# Patient Record
Sex: Female | Born: 1960 | Race: White | Hispanic: No | Marital: Married | Smoking: Never smoker
Health system: Southern US, Community
[De-identification: ages and names within clinical notes are randomized; demographics above are authoritative.]

## PROBLEM LIST (undated history)

## (undated) ENCOUNTER — Ambulatory Visit: Admission: EM | Payer: BC Managed Care – PPO | Source: Home / Self Care

## (undated) DIAGNOSIS — G4733 Obstructive sleep apnea (adult) (pediatric): Secondary | ICD-10-CM

## (undated) DIAGNOSIS — I10 Essential (primary) hypertension: Secondary | ICD-10-CM

## (undated) DIAGNOSIS — T8453XA Infection and inflammatory reaction due to internal right knee prosthesis, initial encounter: Secondary | ICD-10-CM

## (undated) DIAGNOSIS — M545 Low back pain, unspecified: Secondary | ICD-10-CM

## (undated) DIAGNOSIS — M1731 Unilateral post-traumatic osteoarthritis, right knee: Secondary | ICD-10-CM

## (undated) DIAGNOSIS — K219 Gastro-esophageal reflux disease without esophagitis: Secondary | ICD-10-CM

## (undated) DIAGNOSIS — G8929 Other chronic pain: Secondary | ICD-10-CM

## (undated) DIAGNOSIS — Z9989 Dependence on other enabling machines and devices: Secondary | ICD-10-CM

## (undated) DIAGNOSIS — D649 Anemia, unspecified: Secondary | ICD-10-CM

## (undated) DIAGNOSIS — U071 COVID-19: Secondary | ICD-10-CM

## (undated) DIAGNOSIS — M654 Radial styloid tenosynovitis [de Quervain]: Secondary | ICD-10-CM

## (undated) HISTORY — DX: COVID-19: U07.1

## (undated) HISTORY — PX: BACK SURGERY: SHX140

---

## 1987-04-18 DIAGNOSIS — M1731 Unilateral post-traumatic osteoarthritis, right knee: Secondary | ICD-10-CM

## 1987-04-18 HISTORY — DX: Unilateral post-traumatic osteoarthritis, right knee: M17.31

## 1987-04-18 HISTORY — PX: ANTERIOR CRUCIATE LIGAMENT REPAIR: SHX115

## 2000-09-27 ENCOUNTER — Other Ambulatory Visit: Admission: RE | Admit: 2000-09-27 | Discharge: 2000-09-27 | Payer: Self-pay | Admitting: Neurology

## 2001-01-09 ENCOUNTER — Emergency Department (HOSPITAL_COMMUNITY): Admission: EM | Admit: 2001-01-09 | Discharge: 2001-01-09 | Payer: Self-pay | Admitting: Emergency Medicine

## 2001-01-15 ENCOUNTER — Encounter: Payer: Self-pay | Admitting: Internal Medicine

## 2001-01-15 ENCOUNTER — Ambulatory Visit (HOSPITAL_COMMUNITY): Admission: RE | Admit: 2001-01-15 | Discharge: 2001-01-15 | Payer: Self-pay | Admitting: Internal Medicine

## 2001-01-15 HISTORY — PX: ANTERIOR CERVICAL DECOMP/DISCECTOMY FUSION: SHX1161

## 2001-01-21 ENCOUNTER — Emergency Department (HOSPITAL_COMMUNITY): Admission: EM | Admit: 2001-01-21 | Discharge: 2001-01-21 | Payer: Self-pay

## 2001-02-06 ENCOUNTER — Encounter: Payer: Self-pay | Admitting: Neurosurgery

## 2001-02-06 ENCOUNTER — Ambulatory Visit (HOSPITAL_COMMUNITY): Admission: RE | Admit: 2001-02-06 | Discharge: 2001-02-06 | Payer: Self-pay | Admitting: Neurosurgery

## 2001-04-01 ENCOUNTER — Encounter: Payer: Self-pay | Admitting: Neurosurgery

## 2001-04-01 ENCOUNTER — Ambulatory Visit (HOSPITAL_COMMUNITY): Admission: RE | Admit: 2001-04-01 | Discharge: 2001-04-01 | Payer: Self-pay | Admitting: Neurosurgery

## 2001-05-24 ENCOUNTER — Encounter: Payer: Self-pay | Admitting: Orthopaedic Surgery

## 2001-05-24 ENCOUNTER — Ambulatory Visit (HOSPITAL_COMMUNITY): Admission: RE | Admit: 2001-05-24 | Discharge: 2001-05-24 | Payer: Self-pay | Admitting: Orthopaedic Surgery

## 2001-07-16 HISTORY — PX: KNEE ARTHROSCOPY: SUR90

## 2001-07-31 ENCOUNTER — Ambulatory Visit (HOSPITAL_COMMUNITY): Admission: RE | Admit: 2001-07-31 | Discharge: 2001-07-31 | Payer: Self-pay | Admitting: Orthopaedic Surgery

## 2001-09-26 ENCOUNTER — Ambulatory Visit (HOSPITAL_COMMUNITY): Admission: RE | Admit: 2001-09-26 | Discharge: 2001-09-26 | Payer: Self-pay | Admitting: Orthopaedic Surgery

## 2001-10-22 ENCOUNTER — Encounter: Payer: Self-pay | Admitting: Family Medicine

## 2001-10-22 ENCOUNTER — Ambulatory Visit (HOSPITAL_COMMUNITY): Admission: RE | Admit: 2001-10-22 | Discharge: 2001-10-22 | Payer: Self-pay | Admitting: Family Medicine

## 2002-09-04 ENCOUNTER — Ambulatory Visit (HOSPITAL_COMMUNITY): Admission: RE | Admit: 2002-09-04 | Discharge: 2002-09-04 | Payer: Self-pay | Admitting: Internal Medicine

## 2002-09-04 ENCOUNTER — Encounter: Payer: Self-pay | Admitting: Internal Medicine

## 2003-04-18 DIAGNOSIS — G4733 Obstructive sleep apnea (adult) (pediatric): Secondary | ICD-10-CM

## 2003-04-18 HISTORY — DX: Obstructive sleep apnea (adult) (pediatric): G47.33

## 2003-09-14 ENCOUNTER — Emergency Department (HOSPITAL_COMMUNITY): Admission: EM | Admit: 2003-09-14 | Discharge: 2003-09-14 | Payer: Self-pay | Admitting: Specialist

## 2003-11-26 ENCOUNTER — Ambulatory Visit (HOSPITAL_COMMUNITY): Admission: RE | Admit: 2003-11-26 | Discharge: 2003-11-26 | Payer: Self-pay | Admitting: Family Medicine

## 2003-11-27 ENCOUNTER — Ambulatory Visit (HOSPITAL_COMMUNITY): Admission: RE | Admit: 2003-11-27 | Discharge: 2003-11-27 | Payer: Self-pay | Admitting: Family Medicine

## 2005-01-23 ENCOUNTER — Ambulatory Visit (HOSPITAL_COMMUNITY): Admission: RE | Admit: 2005-01-23 | Discharge: 2005-01-23 | Payer: Self-pay | Admitting: Family Medicine

## 2006-02-08 ENCOUNTER — Ambulatory Visit (HOSPITAL_COMMUNITY): Admission: RE | Admit: 2006-02-08 | Discharge: 2006-02-08 | Payer: Self-pay | Admitting: Family Medicine

## 2007-02-12 ENCOUNTER — Ambulatory Visit (HOSPITAL_COMMUNITY): Admission: RE | Admit: 2007-02-12 | Discharge: 2007-02-12 | Payer: Self-pay | Admitting: Family Medicine

## 2008-02-18 ENCOUNTER — Ambulatory Visit (HOSPITAL_COMMUNITY): Admission: RE | Admit: 2008-02-18 | Discharge: 2008-02-18 | Payer: Self-pay | Admitting: Family Medicine

## 2009-02-19 ENCOUNTER — Ambulatory Visit (HOSPITAL_COMMUNITY): Admission: RE | Admit: 2009-02-19 | Discharge: 2009-02-19 | Payer: Self-pay | Admitting: Family Medicine

## 2010-02-21 ENCOUNTER — Ambulatory Visit (HOSPITAL_COMMUNITY): Admission: RE | Admit: 2010-02-21 | Discharge: 2010-02-21 | Payer: Self-pay | Admitting: Family Medicine

## 2010-06-09 ENCOUNTER — Other Ambulatory Visit (HOSPITAL_COMMUNITY): Payer: Self-pay | Admitting: Family Medicine

## 2010-06-09 ENCOUNTER — Ambulatory Visit (HOSPITAL_COMMUNITY)
Admission: RE | Admit: 2010-06-09 | Discharge: 2010-06-09 | Disposition: A | Discharge status: Home / Self Care | Payer: BC Managed Care – PPO | Source: Ambulatory Visit | Attending: Family Medicine | Admitting: Family Medicine

## 2010-06-09 DIAGNOSIS — R0789 Other chest pain: Secondary | ICD-10-CM | POA: Insufficient documentation

## 2010-09-02 NOTE — Op Note (Signed)
Maria Holt. [facility]  Patient:    Maria Holt Visit Number: [id number] MRN: [id number]          Service Type: DSU Location: 3000 [id number] [id number] Attending Physician:  Donn Pierini Dictated by:   Julio Sicks, M.D. Proc. Date: 02/06/01 Admit Date:  02/06/2001 Discharge Date: 02/06/2001                             Operative Report  PREOPERATIVE DIAGNOSIS:  Left C6-7 herniated nucleus pulposus with radiculopathy.  POSTOPERATIVE DIAGNOSIS:  Left C6-7 herniated nucleus pulposus with radiculopathy.  OPERATION PERFORMED:  C6-7 anterior cervical diskectomy and fusion with allograft and anterior plating.  SURGEON:  Julio Sicks, M.D.  ASSISTANT:  Maria Holt, Maria Holt.  ANESTHESIA:  General endotracheal.  INDICATIONS FOR PROCEDURE:  Maria Holt is a 50 year old female with a history of neck and left upper extremity pain, paresthesias and weakness and a left-sided C7 radiculopathy which has failed conservative management.  MRI scan demonstrates a left-sided C6-7 disk herniation with compression on the left-sided C7 nerve root.  The patient was then counseled as to her options. She has decided to proceed with a C6-7 anterior cervical diskectomy and fusion with allograft anterior plating for hopeful improvement of her symptoms.  DESCRIPTION OF PROCEDURE:  The patient was taken to the operating room and placed on the operating table in supine position.  After an adequate level of anesthesia was achieved, the patient was positioned supine with the neck slightly extended and held in place with halter traction.  The patients anterior cervical region was shaved and prepped sterilely.  A 10 blade was used to make a linear incision overlying the C6-7 interspace.  This was carried down sharply to the platysma.  The platysma was then divided vertically and dissection proceeded along the medial border of the sternocleidomastoid muscle and carotid sheath.  The trachea and esophagus  were mobilized and retracted towards the left.  Prevertebral fascia was stripped off the anterior spinal column.  The longus colli muscles were then elevated bilaterally using electrocautery.  Deep self-retaining retractor was placed. Intraoperative fluoroscopy was used and the level was confirmed.  The disk space was then incised with a 15 blade in a rectangular fashion.  A wide disk space clean-out was then achieved using pituitary rongeurs, forward and backward angled Carlens curets, Kerrison rongeurs and a high speed drill.  All elements of the disk were removed down to the posterior annulus.  Microscope was brought into the field and used throughout the remainder of the diskectomy.  The remaining aspects of annulus and osteophytes were removed using the high speed drill.  The posterior longitudinal ligament was then elevated and resected in piecemeal fashion using Kerrison rongeurs.  The underlying thecal sac was identified.  A wide central decompression was then performed using Kerrison rongeurs.  Decompression was then proceeded out to the left sided C7 foramen.  The C7 nerve root was identified proximally.  A moderate amount of free disk herniation was encountered and removed using micronerve hooks and Kerrison rongeur.  The nerve root was well decompressed throughout its foramen.  There was no evidence of any residual stenosis or compression.  There was no evidence of any residual disk material. Decompression then proceeded out to the entrance of the right sided C7 foramen.  The C7 nerve root was identified proximally and found to be free of any compression.  The wound was then irrigated with antibiotic  solution. Gelfoam was placed topically for hemostasis which was found to be good.  The disk space was then distracted and a 7 mm fibular wedge allograft was impacted into place and recessed approximately 1 mm from the anterior cortical surface. A 23 mm Atlantis anterior cervical  plate was then placed over the C6 and C7 levels.  It was then attached with two 13 mm variable angle screws at C6 and C7.  All this was done under fluoroscopic guidance.  All four screws were given a final tightening.  Locking screws were engaged.  Final images revealed good position of the bone grafts and hardware at the proper operative level with normal alignment of the spine.  Retractor system was removed.  Hemostasis was achieved with bipolar electrocautery.  The wound was then closed in a typical fashion.  Steri-Strips and sterile dressings were applied.   The patient tolerated the procedure well and he returned to the recovery room postoperatively. Dictated by:   Julio Sicks, M.D. Attending Physician:  Donn Pierini DD:  02/06/01 TD:  02/06/01 Job: [id number]

## 2010-09-02 NOTE — Op Note (Signed)
[facility]  Patient:    Maria Holt, Maria Holt Visit Number: [id number] MRN: [id number]          Service Type: DSU Location: DAY Attending Physician:  Windle Guard Dictated by:   Darreld Mclean, M.D. Proc. Date: 09/26/01 Admit Date:  09/26/2001 Discharge Date: 09/26/2001                             Operative Report  PREOPERATIVE DIAGNOSIS:  Tear of the medial meniscus of the right knee.  POSTOPERATIVE DIAGNOSES:  Tear of the medial meniscus of the right knee, small tear posterior horn of the lateral meniscus.  The patient status post ACL repair years ago.  PROCEDURE:  Operative arthroscopy, partial medial meniscectomy, partial lateral meniscectomy.  ANESTHESIA:  General.  TOURNIQUET TIME:  20 minutes.  DRAINS:  None.  SURGEON:  Darreld Mclean, M.D.  ASSISTANT:  Candace Cruise, P.A.-C.  INDICATIONS FOR PROCEDURE:  The patient is a 50 year old female who injured her knee back in [place] in 1989 or there about. She had a nonarthroscopic ACL repair done at that time. The patients knee has done well until this past year when she started having significant pain and tenderness and giving way. MRI shows that the ACL repair is still intact but she has got a tear in the medial meniscus. The patient did not improve with conservative therapy and surgery is recommended.  FINDINGS:  The suprapatellar pouch had some mild synovitis, grade 2 changes of the patellofemoral joint. Medially there is grade 3 changes on the tibia and femur medially with a degenerative type tear of the medial meniscus. Small little particles of meniscus were floating within the joint. The anterior cruciate first looked grossly abnormal but apparently there has been some type of repair. There is a stub left from the original ACL. Laterally there was a tear in the posterior horn of the meniscus and some fraying on the most lateral edge. Grade 2 changes laterally much more significant  arthritis medially.  It should be noted the risks and imponderables of the procedure have been discussed with the patient preoperatively and she appeared to understand the procedure as outlined.  DESCRIPTION OF PROCEDURE:  The patient under general anesthesia, supine on the operating table, tourniquet and leg holder placed and deflated right upper thigh. The patient was prepped and draped in the usual manner. Leg elevated and wrapped circumferentially with an Esmarch bandage. The tourniquet inflated to 300 mmHg, Esmarch bandage removed. The inflow cannula was inserted medially. Arthroscope inserted laterally into the knee and knee was systemically examined. Please see findings above. Attention directed to the medial side and using a meniscal shaver and a punch, good smooth contour was obtained. Attention directed to the lateral side with a small tear and this was removed with a meniscal shaver. Some fraying she had most laterally was also removed with the shaver giving smooth contour. Permanent pictures were taken throughout the case. The knee was systemically examined and no pathology found. The wound was reapproximated with 3-0 nylon in interrupted vertical mattress manner. Marcaine 0.25% was instilled in each portal. The tourniquet was deflated for 20 minutes. A sterile dressing and bulky dressing applied. The patient is allergic to multiple medications and I will give her some Ultram for pain. Any difficulty she is to contact me through the office or hospital beeper system. I will be seeing her in approximately 10 days to 2 weeks. Physical therapy has been  arranged. Dictated by:   Darreld Mclean, M.D. Attending Physician:  Windle Guard DD:  09/26/01 TD:  09/28/01 Job: [id number]

## 2010-09-02 NOTE — H&P (Signed)
[facility]  Patient:    Maria Holt, Maria Holt Visit Number: [id number] MRN: [id number]          Service Type: Attending:  Darreld Mclean, M.D. Dictated by:   Darreld Mclean, M.D. Adm. Date:  07/29/01                           History and Physical  CHIEF COMPLAINT:  Right knee pain.  HISTORY OF PRESENT ILLNESS:  The patient is a 50 year old female, with pain and tenderness of her right knee.  She has had significant problems with her right knee, having had an ACL repair on the right in 1989 in [place].  She had a ligament reconstruction nonarthroscopic that was done.  She says she had a very long recovery.  Since then she has had increased pain and tenderness to her knee just over the last six months.  I first saw her May 17, 2001 with complaints of pain and tenderness of the right knee.  I was concerned about a meniscal tear and also about a possibility of a new ACL tear.  She had an MRI done of the knee at the hospital on May 25, 2001.  I saw her on May 31, 2001 and explained to her the MRI showed that the ACL ligament was maintained with no obvious tear; however, she had a tear of the medial meniscus.  She had post surgical changes in the knee from [facility] repair done in 1989.  The knee gives way several times.  She has pain, tenderness, and problems.  The patient wanted to consider surgery but wanted to delay this for a while until she had an okay from her medical doctor because she has had problems with her thyroid after having thyroid surgery.  The patient informs me today, July 29, 2001, that she is just recently discovered to have reflux esophagitis really bad and she is supposed to be seen by the cardiologist to make sure that none of the chest pain she is having can be related to her heart.  I think it is mainly related to the reflux.  PAST SURGICAL HISTORY:  1. ACL repair in 1989 in [place].  2. Anterior diskectomy with fusion in October 2002  [place]  3. Thyroid problems as well.  PAST MEDICAL HISTORY:  History of ulcer disease and reflux as stated. Otherwise, denies heart disease, lung disease, kidney disease, stroke, paralysis, weakness, hypertension, diabetes, TB, rheumatic fever, cancer, polio, or circulatory problems.  ALLERGIES:  1. MORPHINE.  2. DEMEROL.  3. DILAUDID.  CURRENT MEDICATIONS:  1. Tums.  2. Advil.  3. Recently began on Prevacid.  SOCIAL HISTORY:  The patient does not smoke.  She does not drink.  She has finished high school plus two years of college.  Dr. Regino Schultze and Dr. Phillips Odor are her family doctors.  The patient is married and works for the [employer] of [address], lives here in [place], [place].  FAMILY HISTORY:  Hypertension in her father, and he has had open heart surgery.  Paternal grandmother and maternal grandmother have had strokes.  PHYSICAL EXAMINATION:  VITAL SIGNS:  Within normal limits.  HEENT:  Negative.  NECK:  Supple.  LUNGS:  Clear to P&A.  HEART:  Regular without murmur heard.  ABDOMEN:  Soft, nontender, without masses.  EXTREMITIES:  Right knee with some mild effusion.  Well-healed scars from previous surgery.  Slight  crepitus present.  Knee stable.  Other extremities within normal limits.  CNS:  Intact.  SKIN:  Intact.  IMPRESSION:  1. Right knee medial meniscal injury.  2. Status post acromioclavicular repair by nonarthroscopic means in 1989.  3. Recent history of gastric reflux disease, gastroesophageal reflux disease.  4. History of laminectomy, anterior neck with fusion.  5. Allergies to MULTIPLE PAIN MEDICATIONS.  PLAN:  Operative arthroscopy of the right knee.  Discussed with the patient planned procedure, risks, and problems, and appears to understand.  We will await the opinion of the cardiologist and her family physician before final definitive surgical plans. Dictated by:   Darreld Mclean,  M.D. Attending:  Darreld Mclean, M.D. DD:  07/29/01 TD:  07/29/01 Job: [id number]

## 2011-01-18 ENCOUNTER — Other Ambulatory Visit (HOSPITAL_COMMUNITY): Payer: Self-pay | Admitting: Family Medicine

## 2011-01-18 DIAGNOSIS — Z139 Encounter for screening, unspecified: Secondary | ICD-10-CM

## 2011-02-24 ENCOUNTER — Ambulatory Visit (HOSPITAL_COMMUNITY)
Admission: RE | Admit: 2011-02-24 | Discharge: 2011-02-24 | Disposition: A | Discharge status: Home / Self Care | Payer: BC Managed Care – PPO | Source: Ambulatory Visit | Attending: Family Medicine | Admitting: Family Medicine

## 2011-02-24 DIAGNOSIS — Z139 Encounter for screening, unspecified: Secondary | ICD-10-CM

## 2011-02-24 DIAGNOSIS — Z1231 Encounter for screening mammogram for malignant neoplasm of breast: Secondary | ICD-10-CM | POA: Insufficient documentation

## 2012-01-31 ENCOUNTER — Other Ambulatory Visit (HOSPITAL_COMMUNITY): Payer: Self-pay | Admitting: Family Medicine

## 2012-01-31 DIAGNOSIS — Z139 Encounter for screening, unspecified: Secondary | ICD-10-CM

## 2012-02-27 ENCOUNTER — Ambulatory Visit (HOSPITAL_COMMUNITY)
Admission: RE | Admit: 2012-02-27 | Discharge: 2012-02-27 | Disposition: A | Discharge status: Home / Self Care | Payer: BC Managed Care – PPO | Source: Ambulatory Visit | Attending: Family Medicine | Admitting: Family Medicine

## 2012-02-27 DIAGNOSIS — Z139 Encounter for screening, unspecified: Secondary | ICD-10-CM

## 2012-02-27 DIAGNOSIS — Z1231 Encounter for screening mammogram for malignant neoplasm of breast: Secondary | ICD-10-CM | POA: Insufficient documentation

## 2012-02-28 ENCOUNTER — Other Ambulatory Visit: Payer: Self-pay | Admitting: Orthopedic Surgery

## 2012-03-08 ENCOUNTER — Ambulatory Visit (HOSPITAL_COMMUNITY)
Admission: RE | Admit: 2012-03-08 | Discharge: 2012-03-08 | Disposition: A | Discharge status: Home / Self Care | Payer: BC Managed Care – PPO | Source: Ambulatory Visit | Attending: Orthopedic Surgery | Admitting: Orthopedic Surgery

## 2012-03-08 ENCOUNTER — Encounter (HOSPITAL_COMMUNITY): Payer: Self-pay

## 2012-03-08 ENCOUNTER — Encounter (HOSPITAL_COMMUNITY)
Admission: RE | Admit: 2012-03-08 | Discharge: 2012-03-08 | Disposition: A | Discharge status: Home / Self Care | Payer: BC Managed Care – PPO | Source: Ambulatory Visit | Attending: Orthopedic Surgery | Admitting: Orthopedic Surgery

## 2012-03-08 DIAGNOSIS — Z01818 Encounter for other preprocedural examination: Secondary | ICD-10-CM | POA: Insufficient documentation

## 2012-03-08 HISTORY — DX: Essential (primary) hypertension: I10

## 2012-03-08 HISTORY — DX: Gastro-esophageal reflux disease without esophagitis: K21.9

## 2012-03-08 LAB — SURGICAL PCR SCREEN: Staphylococcus aureus: NEGATIVE

## 2012-03-08 LAB — CBC
HCT: 35.7 % — ABNORMAL LOW (ref 36.0–46.0)
MCH: 26.8 pg (ref 26.0–34.0)
MCHC: 33.1 g/dL (ref 30.0–36.0)
MCV: 81 fL (ref 78.0–100.0)
Platelets: 236 10*3/uL (ref 150–400)
RDW: 15.6 % — ABNORMAL HIGH (ref 11.5–15.5)
WBC: 8.1 10*3/uL (ref 4.0–10.5)

## 2012-03-08 LAB — BASIC METABOLIC PANEL
BUN: 9 mg/dL (ref 6–23)
CO2: 26 mEq/L (ref 19–32)
Calcium: 9.5 mg/dL (ref 8.4–10.5)
Chloride: 101 mEq/L (ref 96–112)
Creatinine, Ser: 0.67 mg/dL (ref 0.50–1.10)
Glucose, Bld: 97 mg/dL (ref 70–99)

## 2012-03-08 LAB — URINALYSIS, ROUTINE W REFLEX MICROSCOPIC
Bilirubin Urine: NEGATIVE
Glucose, UA: NEGATIVE mg/dL
Hgb urine dipstick: NEGATIVE
Ketones, ur: NEGATIVE mg/dL
Protein, ur: NEGATIVE mg/dL
pH: 8 (ref 5.0–8.0)

## 2012-03-08 LAB — URINE MICROSCOPIC-ADD ON

## 2012-03-08 NOTE — Pre-Procedure Instructions (Signed)
20 BUNA CUPPETT  03/08/2012   Your procedure is scheduled on: Tuesday, December 3RD   Report to [facility] at  5:30 AM.  Call this number if you have problems the morning of surgery: [phone]   Remember:   Do not eat food OR drink any liquids:After Midnight Monday.    Take these medicines the morning of surgery with A SIP OF WATER:  Prevacid   Do not wear jewelry, make-up or nail polish.  Do not wear lotions, powders, or perfumes. You may NOT wear deodorant.   Do not shave 48 hours prior to surgery.   Do not bring valuables to the hospital.   Contacts, dentures or bridgework may not be worn into surgery.  Leave suitcase in the car. After surgery it may be brought to your room.  For patients admitted to the hospital, checkout time is 11:00 AM the day of discharge.   Patients discharged the day of surgery will not be allowed to drive home.   Name and phone number of your driver: PATRICK --  SPOUSE    Special Instructions: Shower using CHG 2 nights before surgery and the night before surgery.  If you shower the day of surgery use CHG.  Use special wash - you have one bottle of CHG for all showers.  You should use approximately 1/3 of the bottle for each shower.   Please read over the following fact sheets that you were given: Pain Booklet, Coughing and Deep Breathing, Blood Transfusion Information, MRSA Information and Surgical Site Infection Prevention

## 2012-03-08 NOTE — Progress Notes (Signed)
Friday---PATIENT USES CPAP.Marland KitchenMarland KitchenWILL BRING MASK & SETTINGS DOS... SHE ALSO SAID THAT SHE SAW A CARDIO ([facility] ?? ) IN [place] 5-6 YRS AGO FOR SOME CHEST DISCOMFORT...HAD CATH..."ARTERIES WERE LIKE A 51 YR OLD"..NO PROBLEMS SINCE AND HAS NOT BEEN BACK TO SEE ANY CARDIOLOGIST...   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  DA

## 2012-03-09 LAB — URINE CULTURE: Colony Count: NO GROWTH

## 2012-03-18 MED ORDER — CEFAZOLIN SODIUM-DEXTROSE 2-3 GM-% IV SOLR
2.0000 g | INTRAVENOUS | Status: AC
  Administered 2012-03-19: 2 g via INTRAVENOUS
  Filled 2012-03-18: qty 50

## 2012-03-19 ENCOUNTER — Inpatient Hospital Stay (HOSPITAL_COMMUNITY): Payer: BC Managed Care – PPO

## 2012-03-19 ENCOUNTER — Encounter (HOSPITAL_COMMUNITY): Payer: Self-pay | Admitting: Anesthesiology

## 2012-03-19 ENCOUNTER — Encounter (HOSPITAL_COMMUNITY)
Admission: RE | Disposition: A | Discharge status: Home Health | Payer: Self-pay | Source: Ambulatory Visit | Attending: Orthopedic Surgery

## 2012-03-19 ENCOUNTER — Inpatient Hospital Stay (HOSPITAL_COMMUNITY)
Admission: RE | Admit: 2012-03-19 | Discharge: 2012-03-20 | DRG: 209 | Disposition: A | Discharge status: Home Health | Payer: BC Managed Care – PPO | Source: Ambulatory Visit | Attending: Orthopedic Surgery | Admitting: Orthopedic Surgery

## 2012-03-19 ENCOUNTER — Encounter (HOSPITAL_COMMUNITY): Payer: Self-pay | Admitting: *Deleted

## 2012-03-19 ENCOUNTER — Inpatient Hospital Stay (HOSPITAL_COMMUNITY): Payer: BC Managed Care – PPO | Admitting: Anesthesiology

## 2012-03-19 DIAGNOSIS — K219 Gastro-esophageal reflux disease without esophagitis: Secondary | ICD-10-CM | POA: Diagnosis present

## 2012-03-19 DIAGNOSIS — M1710 Unilateral primary osteoarthritis, unspecified knee: Principal | ICD-10-CM | POA: Diagnosis present

## 2012-03-19 DIAGNOSIS — Z882 Allergy status to sulfonamides status: Secondary | ICD-10-CM

## 2012-03-19 DIAGNOSIS — Z6841 Body Mass Index (BMI) 40.0 and over, adult: Secondary | ICD-10-CM

## 2012-03-19 DIAGNOSIS — Z79899 Other long term (current) drug therapy: Secondary | ICD-10-CM

## 2012-03-19 DIAGNOSIS — G4733 Obstructive sleep apnea (adult) (pediatric): Secondary | ICD-10-CM | POA: Diagnosis present

## 2012-03-19 DIAGNOSIS — M654 Radial styloid tenosynovitis [de Quervain]: Secondary | ICD-10-CM | POA: Diagnosis present

## 2012-03-19 DIAGNOSIS — G8929 Other chronic pain: Secondary | ICD-10-CM | POA: Diagnosis present

## 2012-03-19 DIAGNOSIS — Z885 Allergy status to narcotic agent status: Secondary | ICD-10-CM

## 2012-03-19 DIAGNOSIS — I10 Essential (primary) hypertension: Secondary | ICD-10-CM | POA: Diagnosis present

## 2012-03-19 DIAGNOSIS — M1732 Unilateral post-traumatic osteoarthritis, left knee: Secondary | ICD-10-CM | POA: Diagnosis present

## 2012-03-19 DIAGNOSIS — M549 Dorsalgia, unspecified: Secondary | ICD-10-CM | POA: Diagnosis present

## 2012-03-19 HISTORY — DX: Low back pain: M54.5

## 2012-03-19 HISTORY — DX: Low back pain, unspecified: M54.50

## 2012-03-19 HISTORY — DX: Radial styloid tenosynovitis (de quervain): M65.4

## 2012-03-19 HISTORY — DX: Unilateral post-traumatic osteoarthritis, right knee: M17.31

## 2012-03-19 HISTORY — DX: Obstructive sleep apnea (adult) (pediatric): G47.33

## 2012-03-19 HISTORY — PX: TOTAL KNEE ARTHROPLASTY: SHX125

## 2012-03-19 HISTORY — DX: Other chronic pain: G89.29

## 2012-03-19 HISTORY — DX: Dependence on other enabling machines and devices: Z99.89

## 2012-03-19 LAB — ABO/RH: ABO/RH(D): O POS

## 2012-03-19 LAB — TYPE AND SCREEN: ABO/RH(D): O POS

## 2012-03-19 LAB — HCG, SERUM, QUALITATIVE: Preg, Serum: NEGATIVE

## 2012-03-19 SURGERY — ARTHROPLASTY, KNEE, TOTAL
Anesthesia: Regional | Site: Knee | Laterality: Right | Wound class: Clean

## 2012-03-19 MED ORDER — PANTOPRAZOLE SODIUM 20 MG PO TBEC
20.0000 mg | DELAYED_RELEASE_TABLET | Freq: Every day | ORAL | Status: DC
  Administered 2012-03-20: 20 mg via ORAL
  Filled 2012-03-19: qty 1

## 2012-03-19 MED ORDER — WARFARIN SODIUM 10 MG PO TABS
10.0000 mg | ORAL_TABLET | Freq: Once | ORAL | Status: AC
  Administered 2012-03-19: 10 mg via ORAL
  Filled 2012-03-19: qty 1

## 2012-03-19 MED ORDER — PROPOFOL 10 MG/ML IV BOLUS
INTRAVENOUS | Status: DC | PRN
  Administered 2012-03-19: 200 mg via INTRAVENOUS
  Administered 2012-03-19: 50 mg via INTRAVENOUS

## 2012-03-19 MED ORDER — ALUM & MAG HYDROXIDE-SIMETH 200-200-20 MG/5ML PO SUSP: 30.0000 mL | ORAL | Status: DC | PRN

## 2012-03-19 MED ORDER — ACETAMINOPHEN 325 MG PO TABS: 650.0000 mg | ORAL_TABLET | Freq: Four times a day (QID) | ORAL | Status: DC | PRN

## 2012-03-19 MED ORDER — FENTANYL CITRATE 0.05 MG/ML IJ SOLN
INTRAMUSCULAR | Status: DC | PRN
  Administered 2012-03-19 (×2): 100 ug via INTRAVENOUS
  Administered 2012-03-19 (×2): 25 ug via INTRAVENOUS
  Administered 2012-03-19 (×2): 50 ug via INTRAVENOUS

## 2012-03-19 MED ORDER — WARFARIN SODIUM 5 MG PO TABS: 5.0000 mg | ORAL_TABLET | Freq: Every day | ORAL | Status: DC

## 2012-03-19 MED ORDER — LISINOPRIL-HYDROCHLOROTHIAZIDE 10-12.5 MG PO TABS: 1.0000 | ORAL_TABLET | Freq: Every day | ORAL | Status: DC

## 2012-03-19 MED ORDER — KETOROLAC TROMETHAMINE 30 MG/ML IJ SOLN
15.0000 mg | Freq: Once | INTRAMUSCULAR | Status: AC | PRN
  Administered 2012-03-19: 30 mg via INTRAVENOUS

## 2012-03-19 MED ORDER — POTASSIUM CHLORIDE IN NACL 20-0.45 MEQ/L-% IV SOLN
INTRAVENOUS | Status: DC
  Administered 2012-03-19 – 2012-03-20 (×2): via INTRAVENOUS
  Filled 2012-03-19 (×4): qty 1000

## 2012-03-19 MED ORDER — ADULT MULTIVITAMIN W/MINERALS CH
1.0000 | ORAL_TABLET | Freq: Every day | ORAL | Status: DC
  Administered 2012-03-19 – 2012-03-20 (×2): 1 via ORAL
  Filled 2012-03-19 (×2): qty 1

## 2012-03-19 MED ORDER — PROMETHAZINE HCL 25 MG PO TABS: 25.0000 mg | ORAL_TABLET | Freq: Four times a day (QID) | ORAL | Status: DC | PRN

## 2012-03-19 MED ORDER — DEXAMETHASONE SODIUM PHOSPHATE 10 MG/ML IJ SOLN
10.0000 mg | Freq: Three times a day (TID) | INTRAMUSCULAR | Status: AC
  Filled 2012-03-19 (×3): qty 1

## 2012-03-19 MED ORDER — MIDAZOLAM HCL 5 MG/5ML IJ SOLN
INTRAMUSCULAR | Status: DC | PRN
  Administered 2012-03-19: 2 mg via INTRAVENOUS

## 2012-03-19 MED ORDER — METHOCARBAMOL 100 MG/ML IJ SOLN
500.0000 mg | Freq: Four times a day (QID) | INTRAVENOUS | Status: DC | PRN
  Filled 2012-03-19: qty 5

## 2012-03-19 MED ORDER — BUPIVACAINE HCL (PF) 0.25 % IJ SOLN
INTRAMUSCULAR | Status: AC
  Filled 2012-03-19: qty 30

## 2012-03-19 MED ORDER — ZOLPIDEM TARTRATE 5 MG PO TABS: 5.0000 mg | ORAL_TABLET | Freq: Every evening | ORAL | Status: DC | PRN

## 2012-03-19 MED ORDER — FENTANYL CITRATE 0.05 MG/ML IJ SOLN
25.0000 ug | INTRAMUSCULAR | Status: DC | PRN
  Administered 2012-03-19 (×2): 50 ug via INTRAVENOUS

## 2012-03-19 MED ORDER — KETOROLAC TROMETHAMINE 30 MG/ML IJ SOLN
INTRAMUSCULAR | Status: AC
  Filled 2012-03-19: qty 1

## 2012-03-19 MED ORDER — OXYCODONE HCL 5 MG PO TABS
5.0000 mg | ORAL_TABLET | ORAL | Status: DC | PRN
  Administered 2012-03-19 – 2012-03-20 (×2): 10 mg via ORAL
  Filled 2012-03-19 (×2): qty 2

## 2012-03-19 MED ORDER — SENNA-DOCUSATE SODIUM 8.6-50 MG PO TABS: 1.0000 | ORAL_TABLET | Freq: Every day | ORAL | Status: DC

## 2012-03-19 MED ORDER — DIPHENHYDRAMINE HCL 12.5 MG/5ML PO ELIX: 12.5000 mg | ORAL_SOLUTION | ORAL | Status: DC | PRN

## 2012-03-19 MED ORDER — ARTIFICIAL TEARS OP OINT
TOPICAL_OINTMENT | OPHTHALMIC | Status: DC | PRN
  Administered 2012-03-19: 1 via OPHTHALMIC

## 2012-03-19 MED ORDER — WARFARIN - PHARMACIST DOSING INPATIENT: Freq: Every day | Status: DC

## 2012-03-19 MED ORDER — METOCLOPRAMIDE HCL 5 MG/ML IJ SOLN: 5.0000 mg | Freq: Three times a day (TID) | INTRAMUSCULAR | Status: DC | PRN

## 2012-03-19 MED ORDER — TRIAMCINOLONE ACETONIDE 0.1 % EX CREA: 1.0000 "application " | TOPICAL_CREAM | Freq: Two times a day (BID) | CUTANEOUS | Status: DC | PRN

## 2012-03-19 MED ORDER — POLYETHYLENE GLYCOL 3350 17 G PO PACK: 17.0000 g | PACK | Freq: Every day | ORAL | Status: DC | PRN

## 2012-03-19 MED ORDER — ACETAMINOPHEN 650 MG RE SUPP: 650.0000 mg | Freq: Four times a day (QID) | RECTAL | Status: DC | PRN

## 2012-03-19 MED ORDER — KETOROLAC TROMETHAMINE 15 MG/ML IJ SOLN
7.5000 mg | Freq: Four times a day (QID) | INTRAMUSCULAR | Status: DC
  Administered 2012-03-19 (×2): 7.5 mg via INTRAVENOUS
  Administered 2012-03-20: 05:00:00 via INTRAVENOUS
  Filled 2012-03-19 (×4): qty 1

## 2012-03-19 MED ORDER — DEXAMETHASONE 6 MG PO TABS
10.0000 mg | ORAL_TABLET | Freq: Three times a day (TID) | ORAL | Status: AC
  Administered 2012-03-19 – 2012-03-20 (×3): 10 mg via ORAL
  Filled 2012-03-19 (×3): qty 1

## 2012-03-19 MED ORDER — FENTANYL CITRATE 0.05 MG/ML IJ SOLN
INTRAMUSCULAR | Status: AC
  Filled 2012-03-19: qty 2

## 2012-03-19 MED ORDER — PROMETHAZINE HCL 25 MG/ML IJ SOLN: 6.2500 mg | INTRAMUSCULAR | Status: DC | PRN

## 2012-03-19 MED ORDER — DOCUSATE SODIUM 100 MG PO CAPS
100.0000 mg | ORAL_CAPSULE | Freq: Two times a day (BID) | ORAL | Status: DC
  Administered 2012-03-19 – 2012-03-20 (×3): 100 mg via ORAL
  Filled 2012-03-19 (×3): qty 1

## 2012-03-19 MED ORDER — LIDOCAINE HCL (CARDIAC) 20 MG/ML IV SOLN
INTRAVENOUS | Status: DC | PRN
  Administered 2012-03-19: 50 mg via INTRAVENOUS

## 2012-03-19 MED ORDER — ACETAMINOPHEN 10 MG/ML IV SOLN
1000.0000 mg | Freq: Four times a day (QID) | INTRAVENOUS | Status: AC
  Administered 2012-03-19 – 2012-03-20 (×4): 1000 mg via INTRAVENOUS
  Filled 2012-03-19 (×4): qty 100

## 2012-03-19 MED ORDER — HYDROCHLOROTHIAZIDE 12.5 MG PO CAPS
12.5000 mg | ORAL_CAPSULE | Freq: Every day | ORAL | Status: DC
  Administered 2012-03-19: 12.5 mg via ORAL
  Filled 2012-03-19 (×2): qty 1

## 2012-03-19 MED ORDER — GLYCOPYRROLATE 0.2 MG/ML IJ SOLN
INTRAMUSCULAR | Status: DC | PRN
  Administered 2012-03-19: 0.6 mg via INTRAVENOUS

## 2012-03-19 MED ORDER — SORBITOL 70 % SOLN: 30.0000 mL | Freq: Every day | Status: DC | PRN

## 2012-03-19 MED ORDER — FENTANYL CITRATE 0.05 MG/ML IJ SOLN
25.0000 ug | INTRAMUSCULAR | Status: DC | PRN
  Administered 2012-03-19: 25 ug via INTRAVENOUS
  Filled 2012-03-19: qty 2

## 2012-03-19 MED ORDER — ONDANSETRON HCL 4 MG PO TABS: 4.0000 mg | ORAL_TABLET | Freq: Four times a day (QID) | ORAL | Status: DC | PRN

## 2012-03-19 MED ORDER — MENTHOL 3 MG MT LOZG
1.0000 | LOZENGE | OROMUCOSAL | Status: DC | PRN
  Filled 2012-03-19: qty 9

## 2012-03-19 MED ORDER — PHENOL 1.4 % MT LIQD: 1.0000 | OROMUCOSAL | Status: DC | PRN

## 2012-03-19 MED ORDER — OXYCODONE-ACETAMINOPHEN 10-325 MG PO TABS: 1.0000 | ORAL_TABLET | Freq: Four times a day (QID) | ORAL | Status: DC | PRN

## 2012-03-19 MED ORDER — MAGNESIUM CITRATE PO SOLN: 1.0000 | Freq: Once | ORAL | Status: AC | PRN

## 2012-03-19 MED ORDER — ROPIVACAINE HCL 5 MG/ML IJ SOLN
INTRAMUSCULAR | Status: DC | PRN
  Administered 2012-03-19: 30 mL via EPIDURAL

## 2012-03-19 MED ORDER — CALCIUM CARBONATE 600 MG PO TABS
1200.0000 mg | ORAL_TABLET | Freq: Every day | ORAL | Status: DC
  Filled 2012-03-19 (×2): qty 2

## 2012-03-19 MED ORDER — NEOSTIGMINE METHYLSULFATE 1 MG/ML IJ SOLN
INTRAMUSCULAR | Status: DC | PRN
  Administered 2012-03-19: 3 mg via INTRAVENOUS

## 2012-03-19 MED ORDER — MIDAZOLAM HCL 2 MG/2ML IJ SOLN: 0.5000 mg | Freq: Once | INTRAMUSCULAR | Status: DC | PRN

## 2012-03-19 MED ORDER — LACTATED RINGERS IV SOLN
INTRAVENOUS | Status: DC | PRN
  Administered 2012-03-19 (×2): via INTRAVENOUS

## 2012-03-19 MED ORDER — ONDANSETRON HCL 4 MG/2ML IJ SOLN
INTRAMUSCULAR | Status: DC | PRN
  Administered 2012-03-19: 4 mg via INTRAVENOUS

## 2012-03-19 MED ORDER — ENOXAPARIN SODIUM 30 MG/0.3ML ~~LOC~~ SOLN
30.0000 mg | Freq: Two times a day (BID) | SUBCUTANEOUS | Status: DC
  Administered 2012-03-20: 30 mg via SUBCUTANEOUS
  Filled 2012-03-19 (×3): qty 0.3

## 2012-03-19 MED ORDER — METOCLOPRAMIDE HCL 10 MG PO TABS: 5.0000 mg | ORAL_TABLET | Freq: Three times a day (TID) | ORAL | Status: DC | PRN

## 2012-03-19 MED ORDER — SENNA 8.6 MG PO TABS
1.0000 | ORAL_TABLET | Freq: Two times a day (BID) | ORAL | Status: DC
  Administered 2012-03-19 – 2012-03-20 (×3): 8.6 mg via ORAL
  Filled 2012-03-19 (×4): qty 1

## 2012-03-19 MED ORDER — OMEGA-3-ACID ETHYL ESTERS 1 G PO CAPS
1.0000 g | ORAL_CAPSULE | Freq: Every day | ORAL | Status: DC
  Administered 2012-03-19 – 2012-03-20 (×2): 1 g via ORAL
  Filled 2012-03-19 (×2): qty 1

## 2012-03-19 MED ORDER — ONDANSETRON HCL 4 MG/2ML IJ SOLN: 4.0000 mg | Freq: Four times a day (QID) | INTRAMUSCULAR | Status: DC | PRN

## 2012-03-19 MED ORDER — ACETAMINOPHEN 10 MG/ML IV SOLN
INTRAVENOUS | Status: AC
  Filled 2012-03-19: qty 100

## 2012-03-19 MED ORDER — BISACODYL 5 MG PO TBEC: 5.0000 mg | DELAYED_RELEASE_TABLET | Freq: Every day | ORAL | Status: DC | PRN

## 2012-03-19 MED ORDER — METHOCARBAMOL 500 MG PO TABS
500.0000 mg | ORAL_TABLET | Freq: Four times a day (QID) | ORAL | Status: DC | PRN
  Administered 2012-03-19: 500 mg via ORAL
  Filled 2012-03-19: qty 1

## 2012-03-19 MED ORDER — METHOCARBAMOL 500 MG PO TABS: 500.0000 mg | ORAL_TABLET | Freq: Four times a day (QID) | ORAL | Status: DC

## 2012-03-19 MED ORDER — VECURONIUM BROMIDE 10 MG IV SOLR
INTRAVENOUS | Status: DC | PRN
  Administered 2012-03-19: 7 mg via INTRAVENOUS

## 2012-03-19 MED ORDER — LISINOPRIL 10 MG PO TABS
10.0000 mg | ORAL_TABLET | Freq: Every day | ORAL | Status: DC
  Administered 2012-03-19: 10 mg via ORAL
  Filled 2012-03-19 (×3): qty 1

## 2012-03-19 MED ORDER — SODIUM CHLORIDE 0.9 % IR SOLN
Status: DC | PRN
  Administered 2012-03-19: 1

## 2012-03-19 MED ORDER — CEFAZOLIN SODIUM-DEXTROSE 2-3 GM-% IV SOLR
2.0000 g | Freq: Four times a day (QID) | INTRAVENOUS | Status: AC
  Administered 2012-03-19 (×2): 2 g via INTRAVENOUS
  Filled 2012-03-19 (×2): qty 50

## 2012-03-19 SURGICAL SUPPLY — 65 items
APL SKNCLS STERI-STRIP NONHPOA (GAUZE/BANDAGES/DRESSINGS)
BANDAGE ELASTIC 6 VELCRO ST LF (GAUZE/BANDAGES/DRESSINGS) ×3 IMPLANT
BANDAGE ESMARK 6X9 LF (GAUZE/BANDAGES/DRESSINGS) ×1 IMPLANT
BENZOIN TINCTURE PRP APPL 2/3 (GAUZE/BANDAGES/DRESSINGS) ×1 IMPLANT
BLADE SAG 18X100X1.27 (BLADE) ×2 IMPLANT
BLADE SAW RECIP 87.9 MT (BLADE) ×2 IMPLANT
BLADE SAW SGTL 13X75X1.27 (BLADE) ×2 IMPLANT
BNDG CMPR 9X6 STRL LF SNTH (GAUZE/BANDAGES/DRESSINGS) ×1
BNDG ESMARK 6X9 LF (GAUZE/BANDAGES/DRESSINGS) ×2
BOOTCOVER CLEANROOM LRG (PROTECTIVE WEAR) ×4 IMPLANT
BOWL SMART MIX CTS (DISPOSABLE) ×2 IMPLANT
CEMENT HV SMART SET (Cement) ×4 IMPLANT
CLOTH BEACON ORANGE TIMEOUT ST (SAFETY) ×2 IMPLANT
COVER SURGICAL LIGHT HANDLE (MISCELLANEOUS) ×2 IMPLANT
CUFF TOURNIQUET SINGLE 44IN (TOURNIQUET CUFF) ×1 IMPLANT
DRAPE EXTREMITY T 121X128X90 (DRAPE) ×2 IMPLANT
DRAPE PROXIMA HALF (DRAPES) ×2 IMPLANT
DRAPE U-SHAPE 47X51 STRL (DRAPES) ×2 IMPLANT
DRSG PAD ABDOMINAL 8X10 ST (GAUZE/BANDAGES/DRESSINGS) ×3 IMPLANT
DURAPREP 26ML APPLICATOR (WOUND CARE) ×2 IMPLANT
ELECT CAUTERY BLADE 6.4 (BLADE) ×2 IMPLANT
ELECT REM PT RETURN 9FT ADLT (ELECTROSURGICAL) ×2
ELECTRODE REM PT RTRN 9FT ADLT (ELECTROSURGICAL) ×1 IMPLANT
FACESHIELD LNG OPTICON STERILE (SAFETY) ×2 IMPLANT
GAUZE XEROFORM 5X9 LF (GAUZE/BANDAGES/DRESSINGS) ×1 IMPLANT
GLOVE BIO SURGEON STRL SZ7 (GLOVE) ×1 IMPLANT
GLOVE BIO SURGEON STRL SZ8.5 (GLOVE) ×1 IMPLANT
GLOVE BIOGEL PI IND STRL 6 (GLOVE) IMPLANT
GLOVE BIOGEL PI IND STRL 8 (GLOVE) ×2 IMPLANT
GLOVE BIOGEL PI INDICATOR 6 (GLOVE) ×1
GLOVE BIOGEL PI INDICATOR 8 (GLOVE) ×2
GLOVE ORTHO TXT STRL SZ7.5 (GLOVE) ×4 IMPLANT
GLOVE SS BIOGEL STRL SZ 7.5 (GLOVE) IMPLANT
GLOVE SUPERSENSE BIOGEL SZ 7.5 (GLOVE) ×1
GLOVE SURG ORTHO 8.0 STRL STRW (GLOVE) ×2 IMPLANT
GLOVE SURG SS PI 6.5 STRL IVOR (GLOVE) ×1 IMPLANT
GOWN STRL NON-REIN LRG LVL3 (GOWN DISPOSABLE) ×1 IMPLANT
HANDPIECE INTERPULSE COAX TIP (DISPOSABLE) ×2
HOOD PEEL AWAY FACE SHEILD DIS (HOOD) ×4 IMPLANT
IMMOBILIZER KNEE 22 UNIV (SOFTGOODS) ×1 IMPLANT
KIT BASIN OR (CUSTOM PROCEDURE TRAY) ×2 IMPLANT
KIT ROOM TURNOVER OR (KITS) ×2 IMPLANT
MANIFOLD NEPTUNE II (INSTRUMENTS) ×2 IMPLANT
NS IRRIG 1000ML POUR BTL (IV SOLUTION) ×2 IMPLANT
PACK TOTAL JOINT (CUSTOM PROCEDURE TRAY) ×2 IMPLANT
PAD ARMBOARD 7.5X6 YLW CONV (MISCELLANEOUS) ×4 IMPLANT
PAD CAST 4YDX4 CTTN HI CHSV (CAST SUPPLIES) ×1 IMPLANT
PADDING CAST COTTON 4X4 STRL (CAST SUPPLIES) ×2
PADDING CAST COTTON 6X4 STRL (CAST SUPPLIES) ×2 IMPLANT
SET HNDPC FAN SPRY TIP SCT (DISPOSABLE) ×1 IMPLANT
SPONGE GAUZE 4X4 12PLY (GAUZE/BANDAGES/DRESSINGS) ×1 IMPLANT
STAPLER VISISTAT 35W (STAPLE) ×2 IMPLANT
STRIP CLOSURE SKIN 1/2X4 (GAUZE/BANDAGES/DRESSINGS) ×2 IMPLANT
SUCTION FRAZIER TIP 10 FR DISP (SUCTIONS) ×2 IMPLANT
SUT PROLENE 1 CT (SUTURE) ×1 IMPLANT
SUT VIC AB 0 CT1 27 (SUTURE) ×4
SUT VIC AB 0 CT1 27XBRD ANBCTR (SUTURE) ×1 IMPLANT
SUT VIC AB 2-0 CT1 27 (SUTURE) ×2
SUT VIC AB 2-0 CT1 TAPERPNT 27 (SUTURE) ×1 IMPLANT
SUT VIC AB 3-0 SH 18 (SUTURE) ×2 IMPLANT
SYR 30ML LL (SYRINGE) ×2 IMPLANT
TOWEL OR 17X24 6PK STRL BLUE (TOWEL DISPOSABLE) ×2 IMPLANT
TOWEL OR 17X26 10 PK STRL BLUE (TOWEL DISPOSABLE) ×2 IMPLANT
TRAY FOLEY CATH 14FR (SET/KITS/TRAYS/PACK) ×1 IMPLANT
WATER STERILE IRR 1000ML POUR (IV SOLUTION) ×5 IMPLANT

## 2012-03-19 NOTE — Preoperative (Signed)
Beta Blockers   Reason not to administer Beta Blockers:Not Applicable 

## 2012-03-19 NOTE — Op Note (Signed)
DATE OF SURGERY:  03/19/2012 TIME: 9:58 AM  PATIENT NAME:  Maria Holt   AGE: 51 y.o.    PRE-OPERATIVE DIAGNOSIS:  DJD RIGHT KNEE  POST-OPERATIVE DIAGNOSIS:  Same  PROCEDURE:  Procedure(s): TOTAL KNEE ARTHROPLASTY   SURGEON:  Eulas Post, MD   ASSISTANT:  Janace Litten, OPA-C, present and scrubbed throughout the case, critical for assistance with exposure, retraction, instrumentation, and closure.   OPERATIVE IMPLANTS: Depuy PFC Sigma, Posterior Stabilized.  Femur size 4N, Tibia size 3, Patella size 35 3-peg oval button, with a 10 mm polyethylene insert.   PREOPERATIVE INDICATIONS:  Maria Holt is a 51 y.o. year old female with end stage bone on bone degenerative arthritis of the knee who failed conservative treatment, including injections, antiinflammatories, activity modification, and assistive devices, and had significant impairment of their activities of daily living, and elected for Total Knee Arthroplasty. She has had a previous anterior cruciate ligament tear that had a reconstruction, and had some type of wound healing problem, and ultimately never did well from that operation. She says she was never diagnosed with an infection, but her wound did open up, and was told she had a reaction to her sutures. Preoperative workup didn't demonstrate a slightly elevated sedimentation rate, at 28, however there were no clinical signs for infection. She does have morbid obesity, with a BMI of 42, which increases the perioperative risks and complication rate.  The risks, benefits, and alternatives were discussed at length including but not limited to the risks of infection, bleeding, nerve injury, stiffness, blood clots, the need for revision surgery, cardiopulmonary complications, among others, and they were willing to proceed.   OPERATIVE DESCRIPTION:  The patient was brought to the operative room and placed in a supine position.  General anesthesia was administered.  IV  antibiotics were given.  The lower extremity was prepped and draped in the usual sterile fashion.  Time out was performed.  The leg was elevated and exsanguinated and the tourniquet was inflated. Tourniquet time was approximately 100 minutes, at 350 mm of mercury.  Anterior quadriceps tendon splitting approach was performed.  The patella was everted and osteophytes were removed.  There were hypertrophic osteophytes diffusely. This was a severely arthritic knee and was very stiff and difficult to access. The anterior horn of the medial and lateral meniscus was removed.   The distal femur was opened with the drill and the intramedullary distal femoral cutting jig was utilized, set at 5 degrees resecting 10 mm off the distal femur.  Care was taken to protect the collateral ligaments.  Then the extramedullary tibial cutting jig was utilized making the appropriate cut using the anterior tibial crest as a reference building in appropriate posterior slope.  Care was taken during the cut to protect the medial and collateral ligaments.  The proximal tibia was removed along with the posterior horns of the menisci.  The PCL was sacrificed.    The extensor gap was measured and was approximately 8 mm, so I went back and resected an additional 2 mm off of the femur. The extension gap was then 10 mm..    The distal femoral sizing jig was applied, taking care to avoid notching.  Then the 4-in-1 cutting jig was applied and the anterior and posterior femur was cut, along with the chamfer cuts.  All posterior osteophytes were removed.  The flexion gap was then measured and was symmetric with the extension gap.  I completed the distal femoral preparation using the appropriate  jig to prepare the box.  The patella was then measured, and cut with the saw.  It measured 22 mm, and after the resection measured 13.  The proximal tibia sized and prepared accordingly with the reamer and the punch, and then all components were  trialed with the 10mm poly insert.  The knee was found to have excellent balance and full motion.    The above named components were then cemented into place and all excess cement was removed.  The trial polyethylene component was in place during cementation, and then was exchanged for the real polyethylene component.    The knee was easily taken through a range of motion and the patella tracked well and the knee irrigated copiously and the parapatellar and subcutaneous tissue closed with vicryl, and monocryl with steri strips for the skin.  The wounds were injected with marcaine, and dressed with sterile gauze and the tourniquet released and the patient was awakened and returned to the PACU in stable and satisfactory condition.  There were no complications.

## 2012-03-19 NOTE — Transfer of Care (Signed)
Immediate Anesthesia Transfer of Care Note  Patient: Maria Holt  Procedure(s) Performed: Procedure(s) (LRB) with comments: TOTAL KNEE ARTHROPLASTY (Right)  Patient Location: PACU  Anesthesia Type:General  Level of Consciousness: awake, alert  and oriented  Airway & Oxygen Therapy: Patient Spontanous Breathing and Patient connected to nasal cannula oxygen  Post-op Assessment: Report given to PACU RN, Post -op Vital signs reviewed and stable and Patient moving all extremities X 4  Post vital signs: Reviewed and stable  Complications: No apparent anesthesia complications

## 2012-03-19 NOTE — Progress Notes (Signed)
ANTICOAGULATION CONSULT NOTE - Initial Consult  Pharmacy Consult for Coumadin Indication: VTE prophylaxis  Allergies  Allergen Reactions  . Demerol (Meperidine) Hives  . Dilaudid (Hydromorphone Hcl) Hives  . Morphine And Related Hives  . Sulfa Antibiotics Other (See Comments)    REACTION: Unknown.    Vital Signs: Temp: 98.2 F (36.8 C) (12/03 1228) Temp src: Oral (12/03 1228) BP: 111/78 mmHg (12/03 1228) Pulse Rate: 94  (12/03 1228)  Labs: No results found for this basename: HGB:2,HCT:3,PLT:3,APTT:3,LABPROT:3,INR:3,HEPARINUNFRC:3,CREATININE:3,CKTOTAL:3,CKMB:3,TROPONINI:3 in the last 72 hours  CrCl is unknown because there is no height on file for the current visit.   Medical History: Past Medical History  Diagnosis Date  . GERD (gastroesophageal reflux disease)   . Sleep apnea     WEARS MASK-DX 2005  . Hypertension   . Post-traumatic osteoarthritis of left knee 03/19/2012    Medications:  Prescriptions prior to admission  Medication Sig Dispense Refill  . calcium carbonate (OS-CAL) 600 MG TABS Take 1,200 mg by mouth daily.      Marland Kitchen GLUCOSAMINE PO Take 1 tablet by mouth daily.      . lansoprazole (PREVACID) 15 MG capsule Take 15 mg by mouth daily.      Marland Kitchen lisinopril-hydrochlorothiazide (PRINZIDE,ZESTORETIC) 10-12.5 MG per tablet Take 1 tablet by mouth daily.      . Multiple Vitamin (MULTIVITAMIN WITH MINERALS) TABS Take 1 tablet by mouth daily.      Marland Kitchen omega-3 acid ethyl esters (LOVAZA) 1 G capsule Take 1 g by mouth daily.      Marland Kitchen triamcinolone cream (KENALOG) 0.1 % Apply 1 application topically 2 (two) times daily as needed. For eczema      . [DISCONTINUED] ibuprofen (ADVIL,MOTRIN) 400 MG tablet Take 400 mg by mouth daily.        Assessment: 51 y/o female patient s/p right TKA requiring coumadin for DVT px. INR baseline wnl. No drug interactions identified.   Goal of Therapy:  INR 2-3 Monitor platelets by anticoagulation protocol: Yes   Plan:  Coumadin 10mg  today  and f/u daily protime.  Verlene Mayer, PharmD, BCPS Pager [phone] 03/19/2012,1:02 PM

## 2012-03-19 NOTE — H&P (Signed)
PREOPERATIVE H&P  Chief Complaint: DJD RIGHT KNEE  HPI: Maria Holt is a 51 y.o. female who presents for preoperative history and physical with a diagnosis of DJD RIGHT KNEE. Symptoms are rated as moderate to severe, and have been worsening.  This is significantly impairing activities of daily living.  She has elected for surgical management. She has failed injections, activity modification, bracing, NSAIDS.  Past Medical History  Diagnosis Date  . GERD (gastroesophageal reflux disease)   . Sleep apnea     WEARS MASK-DX 2005  . Hypertension    Past Surgical History  Procedure Date  . Anterior cruciate ligament repair     RIGHT KNEE  1989  . Cervical surgery 02   . Ankle arthroscopy     RIGHT  . De quervain's release     LEFT HAND   History   Social History  . Marital Status: Married    Spouse Name: N/A    Number of Children: N/A  . Years of Education: N/A   Social History Main Topics  . Smoking status: Never Smoker   . Smokeless tobacco: Not on file  . Alcohol Use: No  . Drug Use: No  . Sexually Active:    Other Topics Concern  . Not on file   Social History Narrative  . No narrative on file   No family history on file. Allergies  Allergen Reactions  . Demerol (Meperidine) Hives  . Dilaudid (Hydromorphone Hcl) Hives  . Morphine And Related Hives  . Sulfa Antibiotics Other (See Comments)    REACTION: Unknown.   Prior to Admission medications   Medication Sig Start Date End Date Taking? Authorizing Provider  calcium carbonate (OS-CAL) 600 MG TABS Take 1,200 mg by mouth daily.   Yes Historical Provider, MD  ibuprofen (ADVIL,MOTRIN) 400 MG tablet Take 400 mg by mouth daily.   Yes Historical Provider, MD  lansoprazole (PREVACID) 15 MG capsule Take 15 mg by mouth daily.   Yes Historical Provider, MD  lisinopril-hydrochlorothiazide (PRINZIDE,ZESTORETIC) 10-12.5 MG per tablet Take 1 tablet by mouth daily.   Yes Historical Provider, MD  Multiple Vitamin  (MULTIVITAMIN WITH MINERALS) TABS Take 1 tablet by mouth daily.   Yes Historical Provider, MD  omega-3 acid ethyl esters (LOVAZA) 1 G capsule Take 1 g by mouth daily.   Yes Historical Provider, MD  PRESCRIPTION MEDICATION Apply 1 application topically daily as needed. Eczema cream.   Yes Historical Provider, MD  triamcinolone cream (KENALOG) 0.1 % Apply topically 2 (two) times daily.    Historical Provider, MD     Positive ROS: All other systems have been reviewed and were otherwise negative with the exception of those mentioned in the HPI and as above.  Physical Exam: General: Alert, no acute distress Cardiovascular: No pedal edema Respiratory: No cyanosis, no use of accessory musculature GI: No organomegaly, abdomen is soft and non-tender Skin: No lesions in the area of chief complaint Neurologic: Sensation intact distally Psychiatric: Patient is competent for consent with normal mood and affect Lymphatic: No axillary or cervical lymphadenopathy  MUSCULOSKELETAL: right knee with positive crepitance, pain with AROM, PROM, 3-120 degrees.  Assessment: DJD RIGHT KNEE, post acl tear  Plan: Plan for Procedure(s): TOTAL KNEE ARTHROPLASTY  The risks benefits and alternatives were discussed with the patient including but not limited to the risks of nonoperative treatment, versus surgical intervention including infection, bleeding, nerve injury,  blood clots, cardiopulmonary complications, morbidity, mortality, among others, and they were willing to proceed.  Lizzete Gough P, MD Cell [phone] Pager [phone]  03/19/2012 5:58 AM

## 2012-03-19 NOTE — Progress Notes (Signed)
UR COMPLETED  

## 2012-03-19 NOTE — Anesthesia Preprocedure Evaluation (Addendum)
Anesthesia Evaluation  Patient identified by MRN, date of birth, ID band Patient awake    Reviewed: Allergy & Precautions, H&P , Patient's Chart, lab work & pertinent test results, reviewed documented beta blocker date and time   History of Anesthesia Complications Negative for: history of anesthetic complications  Airway Mallampati: II TM Distance: >3 FB Neck ROM: full    Dental No notable dental hx. (+) Teeth Intact and Dental Advidsory Given   Pulmonary neg pulmonary ROS, sleep apnea ,  breath sounds clear to auscultation  Pulmonary exam normal       Cardiovascular Exercise Tolerance: Good hypertension, negative cardio ROS  Rhythm:regular Rate:Normal     Neuro/Psych negative neurological ROS  negative psych ROS   GI/Hepatic negative GI ROS, Neg liver ROS, GERD-  Controlled,  Endo/Other  negative endocrine ROS  Renal/GU negative Renal ROS     Musculoskeletal   Abdominal   Peds  Hematology negative hematology ROS (+)   Anesthesia Other Findings   Reproductive/Obstetrics negative OB ROS                          Anesthesia Physical Anesthesia Plan  ASA: III  Anesthesia Plan: General ETT and Regional   Post-op Pain Management:    Induction:   Airway Management Planned:   Additional Equipment:   Intra-op Plan:   Post-operative Plan:   Informed Consent: I have reviewed the patients History and Physical, chart, labs and discussed the procedure including the risks, benefits and alternatives for the proposed anesthesia with the patient or authorized representative who has indicated his/her understanding and acceptance.   Dental Advisory Given  Plan Discussed with: CRNA, Surgeon and Anesthesiologist  Anesthesia Plan Comments:        Anesthesia Quick Evaluation

## 2012-03-19 NOTE — Progress Notes (Signed)
Orthopedic Tech Progress Note Patient Details:  Maria Holt Jul 13, 1960 [id number]  Patient ID: Maria Holt, female   DOB: [birth year], 51 y.o.   MRN: [id number] Applied OHF to bed per pt RN.  Leanndra Pember T 03/19/2012, 12:21 PM

## 2012-03-19 NOTE — Anesthesia Procedure Notes (Addendum)
Procedure Name: Intubation Date/Time: 03/19/2012 7:49 AM Performed by: Carmela Rima Pre-anesthesia Checklist: Patient identified, Timeout performed, Emergency Drugs available, Suction available and Patient being monitored Patient Re-evaluated:Patient Re-evaluated prior to inductionOxygen Delivery Method: Circle system utilized Preoxygenation: Pre-oxygenation with 100% oxygen Intubation Type: IV induction Ventilation: Mask ventilation without difficulty Laryngoscope Size: Mac and 3 Grade View: Grade I Tube type: Oral Tube size: 7.5 mm Number of attempts: 1 Placement Confirmation: ETT inserted through vocal cords under direct vision,  positive ETCO2 and breath sounds checked- equal and bilateral Secured at: 21 cm Tube secured with: Tape Dental Injury: Teeth and Oropharynx as per pre-operative assessment    Anesthesia Regional Block:  Femoral nerve block  Pre-Anesthetic Checklist: ,, timeout performed, Correct Patient, Correct Site, Correct Laterality, Correct Procedure, Correct Position, site marked, Risks and benefits discussed,  Surgical consent,  Pre-op evaluation,  At surgeon's request and post-op pain management   Prep: chloraprep       Needles:  Injection technique: Single-shot  Needle Type: Stimiplex          Additional Needles:  Procedures: ultrasound guided (picture in chart) Femoral nerve block Narrative:  Start time: 03/19/2012 7:30 AM End time: 03/19/2012 7:35 AM Injection made incrementally with aspirations every 5 mL.  Performed by: Personally  Anesthesiologist: Brayton Caves  Additional Notes: Risks, benefits and alternative to block explained extensively.  Patient tolerated procedure well, without complications.  Supraclavicular block

## 2012-03-19 NOTE — Progress Notes (Signed)
[facility]  Patient Status: New  [facility] is providing the following services: RN, PT and OT  If patient discharges after hours, please call [phone].   Maria Holt 03/19/2012, 3:18 PM

## 2012-03-19 NOTE — Evaluation (Signed)
Physical Therapy Evaluation Patient Details Name: Maria Holt MRN: [id number] DOB: 11-21-1960 Today's Date: 03/19/2012 Time: 4098-1191 PT Time Calculation (min): 44 min  PT Assessment / Plan / Recommendation Clinical Impression  Pt is a 51 y/o female s/p R TKA.  Pt doing very well with mobility and should be appropriate to d/c home tomorrow barring any setback in mobility.       PT Assessment  Patient needs continued PT services    Follow Up Recommendations  Home health PT;Supervision - Intermittent    Does the patient have the potential to tolerate intense rehabilitation      Barriers to Discharge None      Equipment Recommendations  3 in 1 bedside comode    Recommendations for Other Services     Frequency 7X/week    Precautions / Restrictions Precautions Precautions: Knee Precaution Booklet Issued: Yes (comment) Precaution Comments: educated pt and family on positioning R knee to promote knee extension.  Required Braces or Orthoses: Knee Immobilizer - Right Knee Immobilizer - Right: On at all times Restrictions Weight Bearing Restrictions: Yes   Pertinent Vitals/Pain Pt reporting 2/10 pain in knee.  Medicated prior to session.       Mobility  Bed Mobility Bed Mobility: Supine to Sit;Sitting - Scoot to Edge of Bed Supine to Sit: 4: Min guard;HOB flat Sitting - Scoot to [place] of Bed: 4: Min guard Transfers Transfers: Sit to Stand;Stand to Sit Sit to Stand: 4: Min assist;From bed;With upper extremity assist;From elevated surface Stand to Sit: 4: Min assist;To chair/3-in-1;With upper extremity assist Details for Transfer Assistance: Cues for hand and R LE placement assist to steady pt to stand and control descent to sit.  Ambulation/Gait Ambulation/Gait Assistance: 4: Min assist Ambulation Distance (Feet): 35 Feet Assistive device: Rolling walker Ambulation/Gait Assistance Details: Cues for gait sequencing and WBAT on R LE.   Gait Pattern: Step-to  pattern;Decreased hip/knee flexion - right Stairs: No Wheelchair Mobility Wheelchair Mobility: No    Shoulder Instructions     Exercises Total Joint Exercises Ankle Circles/Pumps: Both;10 reps;AROM Quad Sets: Right;5 reps;AROM;Seated   PT Diagnosis: Difficulty walking;Acute pain;Generalized weakness  PT Problem List: Decreased strength;Decreased mobility;Decreased knowledge of use of DME;Decreased knowledge of precautions;Obesity;Pain PT Treatment Interventions: Gait training;Stair training;Functional mobility training;Therapeutic activities;Therapeutic exercise;DME instruction;Patient/family education   PT Goals Acute Rehab PT Goals PT Goal Formulation: With patient Time For Goal Achievement: 03/26/12 Potential to Achieve Goals: Good Pt will go Supine/Side to Sit: with modified independence PT Goal: Supine/Side to Sit - Progress: Goal set today Pt will go Sit to Supine/Side: with modified independence PT Goal: Sit to Supine/Side - Progress: Goal set today Pt will go Sit to Stand: with supervision PT Goal: Sit to Stand - Progress: Goal set today Pt will go Stand to Sit: with supervision PT Goal: Stand to Sit - Progress: Goal set today Pt will Transfer Bed to Chair/Chair to Bed: with supervision PT Transfer Goal: Bed to Chair/Chair to Bed - Progress: Goal set today Pt will Ambulate: 51 - 150 feet;with supervision;with rolling walker PT Goal: Ambulate - Progress: Goal set today Pt will Go Up / Down Stairs: 1-2 stairs;with rolling walker PT Goal: Up/Down Stairs - Progress: Goal set today Pt will Perform Home Exercise Program: with supervision, verbal cues required/provided PT Goal: Perform Home Exercise Program - Progress: Goal set today  Visit Information  Last PT Received On: 03/19/12 Assistance Needed: +1    Subjective Data  Subjective: Agree to PT eval   Prior Functioning  Home Living Lives With: Spouse;Son Available Help at Discharge: Family;Available 24  hours/day Type of Home: House Home Access: Stairs to enter [employer] of Steps: 1 Entrance Stairs-Rails: None Home Layout: One level Bathroom Shower/Tub: Forensic scientist: Handicapped height Bathroom Accessibility: Yes How Accessible: Accessible via walker Home Adaptive Equipment: Walker - rolling;Straight cane;Crutches;Bedside commode/3-in-1 Prior Function Level of Independence: Independent Able to Take Stairs?: Yes Driving: Yes 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, fast food Communication: No difficulties Dominant Hand: Right    Cognition  Overall Cognitive Status: Appears within functional limits for tasks assessed/performed Arousal/Alertness: Awake/alert Orientation Level: Appears intact for tasks assessed Behavior During Session: Restless    Extremity/Trunk Assessment Right Upper Extremity Assessment RUE ROM/Strength/Tone: Within functional levels Left Upper Extremity Assessment LUE ROM/Strength/Tone: Within functional levels Right Lower Extremity Assessment RLE ROM/Strength/Tone: Unable to fully assess Left Lower Extremity Assessment LLE ROM/Strength/Tone: Within functional levels Trunk Assessment Trunk Assessment: Normal   Balance Balance Balance Assessed: No  End of Session PT - End of Session Equipment Utilized During Treatment: Gait belt;Right knee immobilizer Activity Tolerance: Patient tolerated treatment well Patient left: in chair;with call bell/phone within reach;with family/visitor present Nurse Communication: Mobility status  GP     Maria Holt 03/19/2012, 4:21 PM  Maria Holt Maria Holt DPT [phone]

## 2012-03-20 ENCOUNTER — Encounter (HOSPITAL_COMMUNITY): Payer: Self-pay | Admitting: Orthopedic Surgery

## 2012-03-20 LAB — BASIC METABOLIC PANEL
CO2: 26 mEq/L (ref 19–32)
Calcium: 9.2 mg/dL (ref 8.4–10.5)
Creatinine, Ser: 0.62 mg/dL (ref 0.50–1.10)
GFR calc Af Amer: >90 mL/min (ref >90)
GFR calc non Af Amer: >90 mL/min (ref >90)
Sodium: 137 mEq/L (ref 135–145)

## 2012-03-20 LAB — CBC
MCH: 26 pg (ref 26.0–34.0)
MCV: 80.7 fL (ref 78.0–100.0)
Platelets: 247 10*3/uL (ref 150–400)
RBC: 3.84 MIL/uL — ABNORMAL LOW (ref 3.87–5.11)
RDW: 15.3 % (ref 11.5–15.5)

## 2012-03-20 LAB — PROTIME-INR: Prothrombin Time: 14.8 seconds (ref 11.6–15.2)

## 2012-03-20 MED ORDER — SENNA-DOCUSATE SODIUM 8.6-50 MG PO TABS: 1.0000 | ORAL_TABLET | Freq: Every day | ORAL | Status: DC

## 2012-03-20 MED ORDER — WARFARIN VIDEO: Freq: Once | Status: DC

## 2012-03-20 MED ORDER — WARFARIN SODIUM 5 MG PO TABS: 5.0000 mg | ORAL_TABLET | Freq: Every day | ORAL | Status: DC

## 2012-03-20 MED ORDER — WARFARIN SODIUM 10 MG PO TABS
10.0000 mg | ORAL_TABLET | Freq: Once | ORAL | Status: DC
  Filled 2012-03-20: qty 1

## 2012-03-20 MED ORDER — COUMADIN BOOK
Freq: Once | Status: DC
  Filled 2012-03-20: qty 1

## 2012-03-20 MED ORDER — ENOXAPARIN SODIUM 40 MG/0.4ML ~~LOC~~ SOLN: 40.0000 mg | SUBCUTANEOUS | Status: DC

## 2012-03-20 MED ORDER — OXYCODONE-ACETAMINOPHEN 10-325 MG PO TABS: 1.0000 | ORAL_TABLET | Freq: Four times a day (QID) | ORAL | Status: DC | PRN

## 2012-03-20 MED ORDER — BISACODYL 5 MG PO TBEC: 5.0000 mg | DELAYED_RELEASE_TABLET | Freq: Every day | ORAL | Status: DC | PRN

## 2012-03-20 MED ORDER — METHOCARBAMOL 500 MG PO TABS: 500.0000 mg | ORAL_TABLET | Freq: Four times a day (QID) | ORAL | Status: DC

## 2012-03-20 NOTE — Progress Notes (Signed)
Patient ID: Maria Holt, female   DOB: 1960/06/02, 51 y.o.   MRN: [id number]     Subjective:  Patient reports pain as mild.  She states that she is doing very well and would like to go home if possible.  Objective:   VITALS:   Filed Vitals:   03/19/12 1707 03/19/12 2200 03/20/12 0114 03/20/12 0658  BP: 122/63 113/58 124/65 125/60  Pulse:  72 70 73  Temp:  99.4 F (37.4 C) 98.5 F (36.9 C) 98.3 F (36.8 C)  TempSrc:  Oral Oral Oral  Resp:  16 16 16   SpO2:  95% 97% 96%    ABD soft Sensation intact distally Dorsiflexion/Plantar flexion intact Incision: dressing C/D/I and no drainage  LABS  Results for orders placed during the hospital encounter of 03/19/12 (from the past 24 hour(s))  PROTIME-INR     Status: Normal   Collection Time   03/20/12  6:30 AM      Component Value Range   Prothrombin Time 14.8  11.6 - 15.2 seconds   INR 1.18  0.00 - 1.49  CBC     Status: Abnormal   Collection Time   03/20/12  6:30 AM      Component Value Range   WBC 11.7 (*) 4.0 - 10.5 K/uL   RBC 3.84 (*) 3.87 - 5.11 MIL/uL   Hemoglobin 10.0 (*) 12.0 - 15.0 g/dL   HCT 40.9 (*) 81.1 - 91.4 %   MCV 80.7  78.0 - 100.0 fL   MCH 26.0  26.0 - 34.0 pg   MCHC 32.3  30.0 - 36.0 g/dL   RDW 78.2  95.6 - 21.3 %   Platelets 247  150 - 400 K/uL  BASIC METABOLIC PANEL     Status: Abnormal   Collection Time   03/20/12  6:30 AM      Component Value Range   Sodium 137  135 - 145 mEq/L   Potassium 4.3  3.5 - 5.1 mEq/L   Chloride 100  96 - 112 mEq/L   CO2 26  19 - 32 mEq/L   Glucose, Bld 167 (*) 70 - 99 mg/dL   BUN 10  6 - 23 mg/dL   Creatinine, Ser 0.86  0.50 - 1.10 mg/dL   Calcium 9.2  8.4 - 57.8 mg/dL   GFR calc non Af Amer >90  >90 mL/min   GFR calc Af Amer >90  >90 mL/min    Dg Knee Right Port  03/19/2012  *RADIOLOGY REPORT*  Clinical Data: Postop right knee replacement.  PORTABLE RIGHT KNEE - 1-2 VIEW  Comparison: None.  Findings: Changes of right knee replacement.  Several bone fragments are  noted along the lateral femoral condyle, likely old. No definite hardware or bony complicating feature.  Soft tissue and joint space gas present.  IMPRESSION: Changes of right knee replacement as above.  No definite complicating feature.   Original Report Authenticated By: Charlett Nose, M.D.     Assessment/Plan: 1 Day Post-Op   Principal Problem:  *Post-traumatic osteoarthritis of left knee Active Problems:  BMI 40.0-44.9, adult   Advance diet Up with therapy May plan for DC if passes PT   Maria Holt 03/20/2012, 7:43 AM   Maria Lucy, MD Cell [phone] Pager [phone]

## 2012-03-20 NOTE — Progress Notes (Signed)
ANTICOAGULATION CONSULT NOTE - Initial Consult  Pharmacy Consult for Coumadin Indication: VTE prophylaxis  Allergies  Allergen Reactions  . Dilaudid (Hydromorphone Hcl) Hives and Other (See Comments)    "I don't remember the reaction; just know it was a no no" (03/19/2012)  . Demerol (Meperidine) Hives  . Morphine And Related Hives  . Sulfa Antibiotics Other (See Comments)    "don't remember the reaction; Dr. just told me I was allergic" (03/19/2012)    Vital Signs: Temp: 98.3 F (36.8 C) (12/04 0658) Temp src: Oral (12/04 0658) BP: 125/60 mmHg (12/04 0658) Pulse Rate: 73  (12/04 0658)  Labs:  Basename 03/20/12 0630  HGB 10.0*  HCT 31.0*  PLT 247  APTT --  LABPROT 14.8  INR 1.18  HEPARINUNFRC --  CREATININE 0.62  CKTOTAL --  CKMB --  TROPONINI --    Estimated Creatinine Clearance: 111.5 ml/min (by C-G formula based on Cr of 0.62).   Medical History: Past Medical History  Diagnosis Date  . GERD (gastroesophageal reflux disease)   . Hypertension   . De Quervain's disease (tenosynovitis)     "left hand" (03/19/2012)  . OSA on CPAP 2005  . Chronic lower back pain   . Post-traumatic osteoarthritis of right knee 1989    "tore ACL; failed reconstruction" (03/19/2012)    Medications:  Prescriptions prior to admission  Medication Sig Dispense Refill  . calcium carbonate (OS-CAL) 600 MG TABS Take 1,200 mg by mouth daily.      Marland Kitchen GLUCOSAMINE PO Take 1 tablet by mouth daily.      . lansoprazole (PREVACID) 15 MG capsule Take 15 mg by mouth daily.      Marland Kitchen lisinopril-hydrochlorothiazide (PRINZIDE,ZESTORETIC) 10-12.5 MG per tablet Take 1 tablet by mouth daily.      . Multiple Vitamin (MULTIVITAMIN WITH MINERALS) TABS Take 1 tablet by mouth daily.      Marland Kitchen omega-3 acid ethyl esters (LOVAZA) 1 G capsule Take 1 g by mouth daily.      Marland Kitchen triamcinolone cream (KENALOG) 0.1 % Apply 1 application topically 2 (two) times daily as needed. For eczema      . [DISCONTINUED] ibuprofen  (ADVIL,MOTRIN) 400 MG tablet Take 400 mg by mouth daily.        Assessment: 51 y/o female patient s/p right TKA requiring coumadin for DVT px. INR baseline wnl. No drug interactions identified. INR subtherapeutic, remains on lovenox.  Goal of Therapy:  INR 2-3 Monitor platelets by anticoagulation protocol: Yes   Plan:  Repeat Coumadin 10mg  today, continue lovenox until INR>2 and f/u daily protime.  Verlene Mayer, PharmD, BCPS Pager [phone] 03/20/2012,11:25 AM

## 2012-03-20 NOTE — Progress Notes (Signed)
Physical Therapy Treatment Patient Details Name: Maria Holt MRN: [id number] DOB: Apr 27, 1960 Today's Date: 03/20/2012 Time: 4098-1191 PT Time Calculation (min): 75 min  PT Assessment / Plan / Recommendation Comments on Treatment Session  Pt doing exceptionally well and should be able to D/C home after P.M. session of PT.      Follow Up Recommendations  Home health PT;Supervision - Intermittent     Barriers to Discharge  None      Equipment Recommendations  None recommended by PT    Recommendations for Other Services    Frequency 7X/week   Plan Discharge plan remains appropriate;Frequency remains appropriate    Precautions / Restrictions Precautions Precautions: Knee Precaution Booklet Issued: Yes (comment) Precaution Comments: Reviewed  positioning R knee to promote knee extension.  Required Braces or Orthoses: Knee Immobilizer - Right Knee Immobilizer - Right: On at all times Restrictions Weight Bearing Restrictions: No    Pertinent Vitals/Pain Pt reporting no pain at start of session.  4/10 at end of session.  RN notified of pt desire for pain medication.       Mobility  Bed Mobility Bed Mobility: Not assessed Transfers Transfers: Sit to Stand;Stand to Sit Sit to Stand: 5: Supervision;From chair/3-in-1 Stand to Sit: 5: Supervision;To chair/3-in-1 Details for Transfer Assistance: No assistance required pt demonstrating proper technnique.  Ambulation/Gait Ambulation/Gait Assistance: 5: Supervision Ambulation Distance (Feet): 150 Feet Assistive device: Rolling walker Ambulation/Gait Assistance Details: Cues to decrease dependence on UEs.  Gait Pattern: Step-to pattern;Decreased hip/knee flexion - right Gait velocity: WFL  Stairs: Yes Stairs Assistance: 5: Supervision Stairs Assistance Details (indicate cue type and reason): Visual and verbal instruction for posterior technique with walker and anterior technique with walker turned sideways.  Pt able to return  demonstrate both techniques without assistance.  Stair Management Technique: No rails;Forwards;Step to pattern;With walker;Backwards Number of Stairs: 2  Wheelchair Mobility Wheelchair Mobility: No    Exercises Total Joint Exercises Ankle Circles/Pumps: Both;10 reps;AROM Quad Sets: Right;5 reps;AROM;Seated Heel Slides: 10 reps;Right;AROM;AAROM;Seated Goniometric ROM: 2 degrees short of full extension 95 degrees of flexion.     PT Diagnosis:    PT Problem List:   PT Treatment Interventions:     PT Goals Acute Rehab PT Goals PT Goal Formulation: With patient Time For Goal Achievement: 03/26/12 Potential to Achieve Goals: Good Pt will go Sit to Stand: with supervision PT Goal: Sit to Stand - Progress: Met Pt will go Stand to Sit: with supervision PT Goal: Stand to Sit - Progress: Met Pt will Transfer Bed to Chair/Chair to Bed: with supervision PT Transfer Goal: Bed to Chair/Chair to Bed - Progress: Met Pt will Ambulate: 51 - 150 feet;with supervision;with rolling walker PT Goal: Ambulate - Progress: Met Pt will Go Up / Down Stairs: 1-2 stairs;with rolling walker PT Goal: Up/Down Stairs - Progress: Met Pt will Perform Home Exercise Program: with supervision, verbal cues required/provided PT Goal: Perform Home Exercise Program - Progress: Progressing toward goal  Visit Information  Last PT Received On: 03/20/12    Subjective Data  Subjective: I am doing well.    Cognition  Overall Cognitive Status: Appears within functional limits for tasks assessed/performed Arousal/Alertness: Awake/alert Orientation Level: Appears intact for tasks assessed Behavior During Session: [facility] for tasks performed    End of Session PT - End of Session Equipment Utilized During Treatment: Gait belt;Right knee immobilizer Activity Tolerance: Patient tolerated treatment well Patient left: in chair;with call bell/phone within reach;with family/visitor present Nurse Communication: Mobility status    GP  Snow Peoples 03/20/2012, 11:42 AM Theron Arista L. Jamyah Folk DPT [phone]

## 2012-03-20 NOTE — Discharge Summary (Signed)
Physician Discharge Summary  Patient ID: Maria Holt MRN: [id number] DOB/AGE: [birth year] 49 y.o.  Admit date: 03/19/2012 Discharge date: 03/20/2012  Admission Diagnoses:  Post-traumatic osteoarthritis of left knee  Discharge Diagnoses:  Principal Problem:  *Post-traumatic osteoarthritis of left knee Active Problems:  BMI 40.0-44.9, adult   Past Medical History  Diagnosis Date  . GERD (gastroesophageal reflux disease)   . Hypertension   . De Quervain's disease (tenosynovitis)     "left hand" (03/19/2012)  . OSA on CPAP 2005  . Chronic lower back pain   . Post-traumatic osteoarthritis of right knee 1989    "tore ACL; failed reconstruction" (03/19/2012)    Surgeries: Procedure(s): TOTAL KNEE ARTHROPLASTY on 03/19/2012   Consultants (if any):    Discharged Condition: Improved  Hospital Course: Maria Holt is an 51 y.o. female who was admitted 03/19/2012 with a diagnosis of Post-traumatic osteoarthritis of left knee and went to the operating room on 03/19/2012 and underwent the above named procedures.    She was given perioperative antibiotics:  Anti-infectives     Start     Dose/Rate Route Frequency Ordered Stop   03/19/12 1400   ceFAZolin (ANCEF) IVPB 2 g/50 mL premix        2 g 100 mL/hr over 30 Minutes Intravenous Every 6 hours 03/19/12 1226 03/19/12 2043   03/18/12 1444   ceFAZolin (ANCEF) IVPB 2 g/50 mL premix        2 g 100 mL/hr over 30 Minutes Intravenous 60 min pre-op 03/18/12 1444 03/19/12 0759        .  She was given sequential compression devices, early ambulation, and lovenox bridging to coumadin for DVT prophylaxis.  She benefited maximally from the hospital stay and there were no complications.    Recent vital signs:  Filed Vitals:   03/20/12 0658  BP: 125/60  Pulse: 73  Temp: 98.3 F (36.8 C)  Resp: 16    Recent laboratory studies:  Lab Results  Component Value Date   HGB 10.0* 03/20/2012   HGB 11.8* 03/08/2012   Lab Results   Component Value Date   WBC 11.7* 03/20/2012   PLT 247 03/20/2012   Lab Results  Component Value Date   INR 1.18 03/20/2012   Lab Results  Component Value Date   NA 137 03/20/2012   K 4.3 03/20/2012   CL 100 03/20/2012   CO2 26 03/20/2012   BUN 10 03/20/2012   CREATININE 0.62 03/20/2012   GLUCOSE 167* 03/20/2012    Discharge Medications:     Medication List     As of 03/20/2012  8:41 AM    STOP taking these medications         GLUCOSAMINE 1500 COMPLEX PO      ibuprofen 400 MG tablet   Commonly known as: ADVIL,MOTRIN      TAKE these medications         bisacodyl 5 MG EC tablet   Commonly known as: DULCOLAX   Take 1 tablet (5 mg total) by mouth daily as needed for constipation.      bisacodyl 5 MG EC tablet   Commonly known as: DULCOLAX   Take 1 tablet (5 mg total) by mouth daily as needed for constipation.      calcium carbonate 600 MG Tabs   Commonly known as: OS-CAL   Take 1,200 mg by mouth daily.      enoxaparin 40 MG/0.4ML injection   Commonly known as: LOVENOX   Inject 0.4 mLs (40  mg total) into the skin daily.      GLUCOSAMINE PO   Take 1 tablet by mouth daily.      lansoprazole 15 MG capsule   Commonly known as: PREVACID   Take 15 mg by mouth daily.      lisinopril-hydrochlorothiazide 10-12.5 MG per tablet   Commonly known as: PRINZIDE,ZESTORETIC   Take 1 tablet by mouth daily.      methocarbamol 500 MG tablet   Commonly known as: ROBAXIN   Take 1 tablet (500 mg total) by mouth 4 (four) times daily.      methocarbamol 500 MG tablet   Commonly known as: ROBAXIN   Take 1 tablet (500 mg total) by mouth 4 (four) times daily.      multivitamin with minerals Tabs   Take 1 tablet by mouth daily.      omega-3 acid ethyl esters 1 G capsule   Commonly known as: LOVAZA   Take 1 g by mouth daily.      oxyCODONE-acetaminophen 10-325 MG per tablet   Commonly known as: PERCOCET   Take 1-2 tablets by mouth every 6 (six) hours as needed for pain. MAXIMUM  TOTAL ACETAMINOPHEN DOSE IS 4000 MG PER DAY      oxyCODONE-acetaminophen 10-325 MG per tablet   Commonly known as: PERCOCET   Take 1-2 tablets by mouth every 6 (six) hours as needed for pain. MAXIMUM TOTAL ACETAMINOPHEN DOSE IS 4000 MG PER DAY      promethazine 25 MG tablet   Commonly known as: PHENERGAN   Take 1 tablet (25 mg total) by mouth every 6 (six) hours as needed for nausea.      sennosides-docusate sodium 8.6-50 MG tablet   Commonly known as: SENOKOT-S   Take 1 tablet by mouth daily.      sennosides-docusate sodium 8.6-50 MG tablet   Commonly known as: SENOKOT-S   Take 1 tablet by mouth daily.      triamcinolone cream 0.1 %   Commonly known as: KENALOG   Apply 1 application topically 2 (two) times daily as needed. For eczema      warfarin 5 MG tablet   Commonly known as: COUMADIN   Take 1 tablet (5 mg total) by mouth daily.      warfarin 5 MG tablet   Commonly known as: COUMADIN   Take 1 tablet (5 mg total) by mouth daily.        Diagnostic Studies: Dg Chest 2 View  03/08/2012  *RADIOLOGY REPORT*  Clinical Data: Preoperative chest x-ray prior to knee replacement  CHEST - 2 VIEW  Comparison: Prior chest x-ray 06/09/2010  Findings: The lungs are well-aerated and free from pulmonary edema, focal airspace consolidation or pulmonary nodule.  Cardiac and mediastinal contours are within normal limits.  No pneumothorax, or pleural effusion. No acute osseous findings. anterior cervical fusion hardware at C7-T1.  IMPRESSION:  No acute cardiopulmonary disease.   Original Report Authenticated By: Malachy Moan, M.D.    Dg Knee Right Port  03/19/2012  *RADIOLOGY REPORT*  Clinical Data: Postop right knee replacement.  PORTABLE RIGHT KNEE - 1-2 VIEW  Comparison: None.  Findings: Changes of right knee replacement.  Several bone fragments are noted along the lateral femoral condyle, likely old. No definite hardware or bony complicating feature.  Soft tissue and joint space gas  present.  IMPRESSION: Changes of right knee replacement as above.  No definite complicating feature.   Original Report Authenticated By: Charlett Nose, M.D.  Mm Digital Screening  02/28/2012  *RADIOLOGY REPORT*  Clinical Data: Screening.  DIGITAL BILATERAL SCREENING MAMMOGRAM WITH CAD  Comparison:  Previous exams.  Findings:  There are scattered fibroglandular densities. No suspicious masses, architectural distortion, or calcifications are present.  Images were processed with CAD.  IMPRESSION: No mammographic evidence of malignancy.  A result letter of this screening mammogram will be mailed directly to the patient.  RECOMMENDATION: Screening mammogram in one year. (Code:[id number]-[id number])  BI-RADS CATEGORY 1:  Negative.   Original Report Authenticated By: Britta Mccreedy, M.D.     Disposition:       Discharge Orders    Future Orders Please Complete By Expires   Diet general      Diet general      Call MD / Call 911      Comments:   If you experience chest pain or shortness of breath, CALL 911 and be transported to the hospital emergency room.  If you develope a fever above 101 F, pus (white drainage) or increased drainage or redness at the wound, or calf pain, call your surgeon's office.   Discharge instructions      Comments:   Change dressing in 3 days and reapply fresh dressing, unless you have a splint (half cast).  If you have a splint/cast, just leave in place until your follow-up appointment.    Keep wounds dry for 3 weeks.  Leave steri-strips in place on skin.  Do not apply lotion or anything to the wound.   Constipation Prevention      Comments:   Drink plenty of fluids.  Prune juice may be helpful.  You may use a stool softener, such as Colace (over the counter) 100 mg twice a day.  Use MiraLax (over the counter) for constipation as needed.   Call MD / Call 911      Comments:   If you experience chest pain or shortness of breath, CALL 911 and be transported to the hospital emergency room.   If you develope a fever above 101 F, pus (white drainage) or increased drainage or redness at the wound, or calf pain, call your surgeon's office.   Discharge instructions      Comments:   Change dressing in 3 days and reapply fresh dressing, unless you have a splint (half cast).  If you have a splint/cast, just leave in place until your follow-up appointment.    Keep wounds dry for 3 weeks.  Leave steri-strips in place on skin.  Do not apply lotion or anything to the wound.   Constipation Prevention      Comments:   Drink plenty of fluids.  Prune juice may be helpful.  You may use a stool softener, such as Colace (over the counter) 100 mg twice a day.  Use MiraLax (over the counter) for constipation as needed.   TED hose      Comments:   Use stockings (TED hose) for 2 weeks on both leg(s).  You may remove them at night for sleeping.   Change dressing      Comments:   Change dressing in three days, then change the dressing daily with sterile 4 x 4 inch gauze dressing.  You may clean the incision with alcohol prior to redressing.   Do not put a pillow under the knee. Place it under the heel.      Weight bearing as tolerated         Follow-up Information    Follow up with Carrigan Delafuente  P, MD. In 2 weeks.   Contact information:   [address] [place] [address] [phone]           Signed: Eulas Post 03/20/2012, 8:41 AM

## 2012-03-20 NOTE — Plan of Care (Signed)
Problem: Phase I Progression Outcomes Goal: Dangle or out of bed evening of surgery Outcome: Completed/Met Date Met:  03/19/12 OOB and ambulating with PT.

## 2012-03-20 NOTE — Progress Notes (Signed)
Patient discharged in stable condition via wheelchair. Discharge instructions and prescriptions were given and explained 

## 2012-03-20 NOTE — Progress Notes (Signed)
Physical Therapy Treatment Patient Details Name: Maria Holt MRN: [id number] DOB: 1960/10/15 Today's Date: 03/20/2012 Time: 6213-0865 PT Time Calculation (min): 50 min  PT Assessment / Plan / Recommendation Comments on Treatment Session  Pt has met all acute PT goals and is appropriate to D/C to home when cleared by MD.      Follow Up Recommendations  Home health PT;Supervision - Intermittent     Does the patient have the potential to tolerate intense rehabilitation     Barriers to Discharge        Equipment Recommendations  None recommended by PT    Recommendations for Other Services    Frequency 7X/week   Plan Discharge plan remains appropriate;Frequency remains appropriate    Precautions / Restrictions Precautions Precautions: Knee Precaution Booklet Issued: Yes (comment) Required Braces or Orthoses: Knee Immobilizer - Right Knee Immobilizer - Right: On at all times Restrictions Weight Bearing Restrictions: No   Pertinent Vitals/Pain Pain in knee 3/10.  No need for intervention per pt.      Mobility  Bed Mobility Bed Mobility: Supine to Sit;Sit to Supine;Sitting - Scoot to Edge of Bed Supine to Sit: 6: Modified independent (Device/Increase time) Sitting - Scoot to Edge of Bed: 6: Modified independent (Device/Increase time) Sit to Supine: 6: Modified independent (Device/Increase time) Details for Bed Mobility Assistance: pt able to manage R LE independently Transfers Transfers: Sit to Stand;Stand to Sit Sit to Stand: 6: Modified independent (Device/Increase time);With upper extremity assist;From bed;From chair/3-in-1 Stand to Sit: 6: Modified independent (Device/Increase time);To bed;To chair/3-in-1 Details for Transfer Assistance: No assistance required pt demonstrating proper technnique Ambulation/Gait Ambulation/Gait Assistance: 6: Modified independent (Device/Increase time) Ambulation Distance (Feet): 150 Feet Assistive device: Rolling walker Ambulation/Gait  Assistance Details: Pt ambulating with no KI c/o mild weakness in R LE but no loss of extensor tone.   Gait Pattern: Step-through pattern Gait velocity: WFL  Stairs: No Wheelchair Mobility Wheelchair Mobility: No    Exercises Total Joint Exercises Ankle Circles/Pumps: Both;10 reps;AROM Quad Sets: 10 reps;Right;Supine Short Arc Quad: AAROM;10 reps;Supine (Pt unable to perform independently. ) Heel Slides: 10 reps;Right;AROM;AAROM;Seated Hip ABduction/ADduction: 10 reps;Supine;Right Straight Leg Raises: 10 reps;Right;AAROM;Supine (pt presents with extensor lag.  )   PT Diagnosis:    PT Problem List:   PT Treatment Interventions:     PT Goals Acute Rehab PT Goals PT Goal Formulation: With patient Time For Goal Achievement: 03/26/12 Potential to Achieve Goals: Good Pt will go Supine/Side to Sit: with modified independence PT Goal: Supine/Side to Sit - Progress: Met Pt will go Sit to Supine/Side: with modified independence PT Goal: Sit to Supine/Side - Progress: Met Pt will go Sit to Stand: with supervision PT Goal: Sit to Stand - Progress: Met Pt will go Stand to Sit: with supervision PT Goal: Stand to Sit - Progress: Met Pt will Transfer Bed to Chair/Chair to Bed: with supervision PT Transfer Goal: Bed to Chair/Chair to Bed - Progress: Met Pt will Ambulate: 51 - 150 feet;with supervision;with rolling walker PT Goal: Ambulate - Progress: Met Pt will Go Up / Down Stairs: 1-2 stairs;with rolling walker PT Goal: Up/Down Stairs - Progress: Met Pt will Perform Home Exercise Program: with supervision, verbal cues required/provided PT Goal: Perform Home Exercise Program - Progress: Met  Visit Information  Last PT Received On: 03/20/12 Assistance Needed: +1    Subjective Data  Subjective: I am doing well.  Patient Stated Goal: go home soon   Cognition  Overall Cognitive Status: Appears within functional limits  for tasks assessed/performed Arousal/Alertness:  Awake/alert Orientation Level: Appears intact for tasks assessed Behavior During Session: [facility] for tasks performed    Balance     End of Session PT - End of Session Equipment Utilized During Treatment: Gait belt;Right knee immobilizer Activity Tolerance: Patient tolerated treatment well Patient left: in chair;with call bell/phone within reach;with family/visitor present Nurse Communication: Mobility status   GP     Therman Hughlett 03/20/2012, 2:23 PM Kirin Brandenburger L. Josede Cicero DPT [phone]

## 2012-03-20 NOTE — Evaluation (Signed)
Occupational Therapy Evaluation Patient Details Name: Maria Holt MRN: [id number] DOB: Dec 19, 1960 Today's Date: 03/20/2012 Time: 9147-8295 OT Time Calculation (min): 25 min  OT Assessment / Plan / Recommendation Clinical Impression  51 yo female s/p Rt TKA that does not require skilled OT acutely. Ot to sign off    OT Assessment  Patient does not need any further OT services    Follow Up Recommendations  No OT follow up    Barriers to Discharge      Equipment Recommendations  None recommended by OT    Recommendations for Other Services    Frequency       Precautions / Restrictions Precautions Precautions: Knee Precaution Booklet Issued: Yes (comment) Required Braces or Orthoses: Knee Immobilizer - Right Knee Immobilizer - Right: On at all times Restrictions Weight Bearing Restrictions: No   Pertinent Vitals/Pain No co pain at this time KI don    ADL  Eating/Feeding: Independent Where Assessed - Eating/Feeding: Chair Grooming: Wash/dry hands;Wash/dry face;Modified independent Where Assessed - Grooming: Unsupported standing Lower Body Dressing: Supervision/safety Where Assessed - Lower Body Dressing: Unsupported sit to stand Toilet Transfer: Modified independent Toilet Transfer Method: Sit to stand Toilet Transfer Equipment: Raised toilet seat with arms (or 3-in-1 over toilet) Tub/Shower Transfer: Therapist, sports Method: Science writer: Emergency planning/management officer (DEMONSTRATED 3N1 SEAT as bench with husband present) Equipment Used: Knee Immobilizer;Gait belt;Rolling walker Transfers/Ambulation Related to ADLs: pt ambualting mod I with RW and KI ADL Comments: Pt don pants with RN this Am without deficits. Pt reports independence with bed mobility. Pt has 3n1 for home use over toilet. pt educated and demosntrated tub transfer. Husabnd present for all education    OT Diagnosis:    OT Problem List:   OT Treatment  Interventions:     OT Goals    Visit Information  Last OT Received On: 03/20/12 Assistance Needed: +1    Subjective Data  Subjective: "I have asked others that have had this surgery to see what I would really need so I have borrowed as much as I could"- pt response to equipment questions Patient Stated Goal: to go home with husband and get back to having a life   Prior Functioning     Home Living Lives With: Spouse;Son Available Help at Discharge: Family;Available 24 hours/day Type of Home: House Home Access: Stairs to enter [employer] of Steps: 1 Entrance Stairs-Rails: None Home Layout: One level Bathroom Shower/Tub: Forensic scientist: Handicapped height Bathroom Accessibility: Yes How Accessible: Accessible via walker Home Adaptive Equipment: Walker - rolling;Straight cane;Crutches;Bedside commode/3-in-1 Prior Function Level of Independence: Independent Able to Take Stairs?: Yes Driving: Yes Vocation: Full time employment Comments: clerical Communication Communication: No difficulties Dominant Hand: Right         Vision/Perception     Cognition  Overall Cognitive Status: Appears within functional limits for tasks assessed/performed Arousal/Alertness: Awake/alert Orientation Level: Appears intact for tasks assessed Behavior During Session: [facility] for tasks performed    Extremity/Trunk Assessment Right Upper Extremity Assessment RUE ROM/Strength/Tone: Within functional levels Left Upper Extremity Assessment LUE ROM/Strength/Tone: Within functional levels Trunk Assessment Trunk Assessment: Normal     Mobility Bed Mobility Bed Mobility: Supine to Sit;Sit to Supine;Sitting - Scoot to [place] of Bed Transfers Sit to Stand: 5: Supervision;With upper extremity assist;From bed Stand to Sit: 5: Supervision;With upper extremity assist;To chair/3-in-1 Details for Transfer Assistance: no assistance     Shoulder Instructions      Exercise     Balance  End of Session OT - End of Session Activity Tolerance: Patient tolerated treatment well Patient left: in chair;with call bell/phone within reach;with family/visitor present Nurse Communication: Mobility status;Precautions  GO     Lucile Shutters 03/20/2012, 1:26 PM Pager: [phone]

## 2012-03-24 NOTE — Anesthesia Postprocedure Evaluation (Signed)
Anesthesia Post Note  Patient: Maria Holt  Procedure(s) Performed: Procedure(s) (LRB): TOTAL KNEE ARTHROPLASTY (Right)  Anesthesia type: GA  Patient location: PACU  Post pain: Pain level controlled  Post assessment: Post-op Vital signs reviewed  Last Vitals:  Filed Vitals:   03/20/12 1440  BP: 117/54  Pulse: 90  Temp: 37.2 C  Resp: 17    Post vital signs: Reviewed  Level of consciousness: sedated  Complications: No apparent anesthesia complications

## 2012-04-16 ENCOUNTER — Ambulatory Visit (HOSPITAL_COMMUNITY)
Admission: RE | Admit: 2012-04-16 | Discharge: 2012-04-16 | Disposition: A | Discharge status: Home / Self Care | Payer: BC Managed Care – PPO | Source: Ambulatory Visit | Attending: Orthopedic Surgery | Admitting: Orthopedic Surgery

## 2012-04-16 DIAGNOSIS — M25569 Pain in unspecified knee: Secondary | ICD-10-CM | POA: Insufficient documentation

## 2012-04-16 DIAGNOSIS — M25669 Stiffness of unspecified knee, not elsewhere classified: Secondary | ICD-10-CM | POA: Insufficient documentation

## 2012-04-16 DIAGNOSIS — IMO0001 Reserved for inherently not codable concepts without codable children: Secondary | ICD-10-CM | POA: Insufficient documentation

## 2012-04-16 DIAGNOSIS — R29898 Other symptoms and signs involving the musculoskeletal system: Secondary | ICD-10-CM | POA: Insufficient documentation

## 2012-04-16 DIAGNOSIS — R262 Difficulty in walking, not elsewhere classified: Secondary | ICD-10-CM | POA: Insufficient documentation

## 2012-04-16 NOTE — Evaluation (Addendum)
Physical Therapy Evaluation  Patient Details  Name: Maria Holt MRN: [id number] Date of Birth: 1961-04-04  Today's Date: 04/16/2012 Time: 4098-1191 PT Time Calculation (min): 45 min  Visit#: 1  of 8   Re-eval: 05/16/12 Assessment Diagnosis: R TKR Surgical Date: 03/19/12 Next MD Visit: 04/29/12 Prior Therapy: HH  Authorization: [employer]   Past Medical History:  Past Medical History  Diagnosis Date  . GERD (gastroesophageal reflux disease)   . Hypertension   . De Quervain's disease (tenosynovitis)     "left hand" (03/19/2012)  . OSA on CPAP 2005  . Chronic lower back pain   . Post-traumatic osteoarthritis of right knee 1989    "tore ACL; failed reconstruction" (03/19/2012)   Past Surgical History:  Past Surgical History  Procedure Date  . Anterior cruciate ligament repair 1989    "right" (03/19/2012)  . Knee arthroscopy 07/2001    RIGHT  . Anterior cervical decomp/discectomy fusion 01/2001  . Total knee arthroplasty 03/19/2012    "right" (03/19/2012)  . Total knee arthroplasty 03/19/2012    Procedure: TOTAL KNEE ARTHROPLASTY;  Surgeon: Eulas Post, MD;  Location: [facility] OR;  Service: Orthopedics;  Laterality: Right;    Subjective Symptoms/Limitations Symptoms: Maria Holt underwent a right TKR on 03/19/12.  She was discharged on the 4th to [facility].  She is now being referred to outpatient physical therapy to maximize her functional ability.  Maria Holt states that she has noticed some weeping from her incision and she has had a low grade fever.  Her knee is positive for erythema and rubor.  She states that the [facility] nurse is aware of this and was calling her MD yesterday to attempt to get antibiotics for her. Pertinent History: Pt started using a cane last week.  She then used  ice while in extension for 20 minutes twice and she had increased pain, swelling and had to go back to a walker. How long can you sit comfortably?: an hour How long can you stand comfortably?: 30 minutes How long  can you walk comfortably?: Pt is walking with a walker and has walked for 30 minutes.  Pt started using a cane last week  Special Tests: Pt is waking up several times a night, ( at least 4-6 times a night) Patient Stated Goals: To get back to a normal life  Pain Assessment Currently in Pain?: Yes Pain Score:   2 (worst 7/10) Pain Location: Knee Pain Orientation: Right Pain Type: Surgical pain Pain Onset: More than a month ago  Prior Functioning  Home Living Lives With: Family Type of Home: House Home Access: Stairs to enter Secretary/administrator of Steps: 2 Home Layout: One level Prior Function Driving: Yes Vocation: Full time employment Vocation Requirements: [employer] Leisure: Hobbies-yes (Comment)  Cognition/Observation Cognition Overall Cognitive Status: Appears within functional limits for tasks assessed Observation/Other Assessments Observations: increased swelling; redness and heat-pt has call in for antibiotic from [facility] nurse.    Assessment RLE AROM (degrees) Right Knee Extension: 20  Right Knee Flexion: 90  RLE Strength Right Hip Flexion: 5/5 Right Hip Extension: 4/5 Right Hip ABduction: 5/5 Right Knee Flexion: 4/5 Right Knee Extension: 3+/5 Right Ankle Dorsiflexion: 3+/5  Exercise/Treatments   Seated Long Arc Quad: 10 reps Supine Quad Sets: 10 reps Heel Slides: 10 reps   Physical Therapy Assessment and Plan PT Assessment and Plan Clinical Impression Statement: Pt s/p TKR who has decreased ROM and strength limitng functional mobility who will benefit from skilled PT to maximize functional  ability. Pt will benefit from skilled therapeutic intervention in order to improve on the following deficits: Decreased activity tolerance;Decreased balance;Decreased mobility;Decreased range of motion;Decreased strength;Difficulty walking;Increased edema;Pain Rehab Potential: Good PT Frequency: Min 2X/week PT Duration: 4 weeks PT Treatment/Interventions: Gait  training;Therapeutic activities;Therapeutic exercise;Manual techniques;Modalities PT Plan: begin bike, rockerboard, SLS, knee flex, heel raises, minisquats, terminal extension both standing and supine and retromassage to decrease swelling.    Goals Home Exercise Program Pt will Perform Home Exercise Program: Independently PT Short Term Goals Time to Complete Short Term Goals: 2 weeks PT Short Term Goal 1: Pt ROM to be improved to 10 to 110 degrees to normalize gt and allow comfort with prolong sitting PT Short Term Goal 2: Pt strength improved 1/2 grade to allow pt to ambulate with a cane PT Long Term Goals Time to Complete Long Term Goals: 4 weeks PT Long Term Goal 1: Pt ROM to be 3 to 115 to allow normalized gt  PT Long Term Goal 2: PT to be able to sit with comfort for two hours Long Term Goal 3: Pt to be ambulating in the house without an assistive device Long Term Goal 4: Pt tobe able to ambulate for up to an hour without difficulty PT Long Term Goal 5: Pt pain level to be decreased to no greater than a 3 to be sleeping through the night  Problem List Patient Active Problem List  Diagnosis  . Post-traumatic osteoarthritis of left knee  . BMI 40.0-44.9, adult  . Difficulty in walking  . Stiffness of joint, not elsewhere classified, lower leg  . Weakness of right leg    PT - End of Session Equipment Utilized During Treatment: Gait belt Activity Tolerance: Patient tolerated treatment well General Behavior During Session: [facility] for tasks performed Cognition: [facility] for tasks performed PT Plan of Care PT Home Exercise Plan: given  GP    Ellowyn Rieves,CINDY 04/16/2012, 3:14 PM  Physician Documentation Your signature is required to indicate approval of the treatment plan as stated above.  Please sign and either send electronically or make a copy of this report for your files and return this physician signed original.   Please mark one 1.__approve of plan  2. ___approve of plan with  the following conditions.   ______________________________                                                          _____________________ Physician Signature                                                                                                             Date

## 2012-04-18 ENCOUNTER — Other Ambulatory Visit (HOSPITAL_COMMUNITY): Payer: Self-pay | Admitting: Orthopedic Surgery

## 2012-04-18 ENCOUNTER — Ambulatory Visit (HOSPITAL_COMMUNITY)
Admission: RE | Admit: 2012-04-18 | Discharge: 2012-04-18 | Disposition: A | Discharge status: Home / Self Care | Payer: BC Managed Care – PPO | Source: Ambulatory Visit | Attending: Orthopedic Surgery | Admitting: Orthopedic Surgery

## 2012-04-18 ENCOUNTER — Encounter (HOSPITAL_COMMUNITY): Payer: Self-pay | Admitting: *Deleted

## 2012-04-18 ENCOUNTER — Other Ambulatory Visit: Payer: Self-pay | Admitting: Orthopedic Surgery

## 2012-04-18 DIAGNOSIS — T8450XA Infection and inflammatory reaction due to unspecified internal joint prosthesis, initial encounter: Principal | ICD-10-CM | POA: Diagnosis present

## 2012-04-18 DIAGNOSIS — T8140XA Infection following a procedure, unspecified, initial encounter: Secondary | ICD-10-CM

## 2012-04-18 DIAGNOSIS — Z7901 Long term (current) use of anticoagulants: Secondary | ICD-10-CM

## 2012-04-18 DIAGNOSIS — Y831 Surgical operation with implant of artificial internal device as the cause of abnormal reaction of the patient, or of later complication, without mention of misadventure at the time of the procedure: Secondary | ICD-10-CM | POA: Diagnosis present

## 2012-04-18 DIAGNOSIS — I10 Essential (primary) hypertension: Secondary | ICD-10-CM | POA: Diagnosis present

## 2012-04-18 DIAGNOSIS — Z96659 Presence of unspecified artificial knee joint: Secondary | ICD-10-CM | POA: Insufficient documentation

## 2012-04-18 DIAGNOSIS — K5900 Constipation, unspecified: Secondary | ICD-10-CM | POA: Diagnosis present

## 2012-04-18 DIAGNOSIS — G4733 Obstructive sleep apnea (adult) (pediatric): Secondary | ICD-10-CM | POA: Diagnosis present

## 2012-04-18 DIAGNOSIS — M545 Low back pain, unspecified: Secondary | ICD-10-CM | POA: Diagnosis present

## 2012-04-18 DIAGNOSIS — G8929 Other chronic pain: Secondary | ICD-10-CM | POA: Diagnosis present

## 2012-04-18 DIAGNOSIS — K219 Gastro-esophageal reflux disease without esophagitis: Secondary | ICD-10-CM | POA: Diagnosis present

## 2012-04-18 DIAGNOSIS — D62 Acute posthemorrhagic anemia: Secondary | ICD-10-CM | POA: Diagnosis present

## 2012-04-18 DIAGNOSIS — Z79899 Other long term (current) drug therapy: Secondary | ICD-10-CM

## 2012-04-18 MED ORDER — LIDOCAINE HCL 1 % IJ SOLN
INTRAMUSCULAR | Status: AC
  Filled 2012-04-18: qty 20

## 2012-04-18 NOTE — Procedures (Signed)
succesful RUE SL POWER PICC TIP SVC/RA   NO COMP READY FOR USE FULL REPORT IN PACS

## 2012-04-19 ENCOUNTER — Encounter (HOSPITAL_COMMUNITY): Payer: Self-pay | Admitting: Orthopedic Surgery

## 2012-04-19 ENCOUNTER — Encounter (HOSPITAL_COMMUNITY)
Admission: RE | Disposition: A | Discharge status: Home / Self Care | Payer: Self-pay | Source: Ambulatory Visit | Attending: Orthopedic Surgery

## 2012-04-19 ENCOUNTER — Inpatient Hospital Stay (HOSPITAL_COMMUNITY): Payer: BC Managed Care – PPO | Admitting: Anesthesiology

## 2012-04-19 ENCOUNTER — Encounter (HOSPITAL_COMMUNITY): Payer: Self-pay | Admitting: Anesthesiology

## 2012-04-19 ENCOUNTER — Ambulatory Visit (HOSPITAL_COMMUNITY): Payer: BC Managed Care – PPO | Admitting: Physical Therapy

## 2012-04-19 ENCOUNTER — Encounter (HOSPITAL_COMMUNITY): Payer: Self-pay | Admitting: *Deleted

## 2012-04-19 DIAGNOSIS — T8453XA Infection and inflammatory reaction due to internal right knee prosthesis, initial encounter: Secondary | ICD-10-CM

## 2012-04-19 DIAGNOSIS — D62 Acute posthemorrhagic anemia: Secondary | ICD-10-CM | POA: Diagnosis not present

## 2012-04-19 DIAGNOSIS — M1732 Unilateral post-traumatic osteoarthritis, left knee: Secondary | ICD-10-CM | POA: Diagnosis present

## 2012-04-19 DIAGNOSIS — M009 Pyogenic arthritis, unspecified: Secondary | ICD-10-CM

## 2012-04-19 DIAGNOSIS — R29898 Other symptoms and signs involving the musculoskeletal system: Secondary | ICD-10-CM | POA: Diagnosis present

## 2012-04-19 DIAGNOSIS — Z6841 Body Mass Index (BMI) 40.0 and over, adult: Secondary | ICD-10-CM

## 2012-04-19 DIAGNOSIS — M25669 Stiffness of unspecified knee, not elsewhere classified: Secondary | ICD-10-CM | POA: Diagnosis present

## 2012-04-19 DIAGNOSIS — R262 Difficulty in walking, not elsewhere classified: Secondary | ICD-10-CM | POA: Diagnosis present

## 2012-04-19 HISTORY — DX: Infection and inflammatory reaction due to internal right knee prosthesis, initial encounter: T84.53XA

## 2012-04-19 HISTORY — PX: I & D KNEE WITH POLY EXCHANGE: SHX5024

## 2012-04-19 LAB — CBC
HCT: 29.9 % — ABNORMAL LOW (ref 36.0–46.0)
Hemoglobin: 9.6 g/dL — ABNORMAL LOW (ref 12.0–15.0)
MCH: 25.9 pg — ABNORMAL LOW (ref 26.0–34.0)
MCH: 26 pg (ref 26.0–34.0)
MCHC: 32.1 g/dL (ref 30.0–36.0)
Platelets: 270 10*3/uL (ref 150–400)
RBC: 4.21 MIL/uL (ref 3.87–5.11)
RDW: 15.3 % (ref 11.5–15.5)
RDW: 15.3 % (ref 11.5–15.5)

## 2012-04-19 LAB — PROTIME-INR: Prothrombin Time: 21.8 seconds — ABNORMAL HIGH (ref 11.6–15.2)

## 2012-04-19 LAB — BASIC METABOLIC PANEL
Calcium: 9.6 mg/dL (ref 8.4–10.5)
Creatinine, Ser: 0.62 mg/dL (ref 0.50–1.10)
GFR calc non Af Amer: >90 mL/min (ref >90)
Glucose, Bld: 123 mg/dL — ABNORMAL HIGH (ref 70–99)
Sodium: 137 mEq/L (ref 135–145)

## 2012-04-19 LAB — TYPE AND SCREEN: Antibody Screen: NEGATIVE

## 2012-04-19 LAB — CREATININE, SERUM: GFR calc non Af Amer: >90 mL/min (ref >90)

## 2012-04-19 SURGERY — IRRIGATION AND DEBRIDEMENT KNEE WITH POLY EXCHANGE
Anesthesia: General | Site: Knee | Laterality: Right | Wound class: Dirty or Infected

## 2012-04-19 MED ORDER — DIPHENHYDRAMINE HCL 12.5 MG/5ML PO ELIX: 12.5000 mg | ORAL_SOLUTION | ORAL | Status: DC | PRN

## 2012-04-19 MED ORDER — LACTATED RINGERS IV SOLN
INTRAVENOUS | Status: DC
  Administered 2012-04-19: 11:00:00 via INTRAVENOUS

## 2012-04-19 MED ORDER — METHOCARBAMOL 500 MG PO TABS: 500.0000 mg | ORAL_TABLET | Freq: Four times a day (QID) | ORAL | Status: DC

## 2012-04-19 MED ORDER — MIDAZOLAM HCL 2 MG/2ML IJ SOLN
INTRAMUSCULAR | Status: AC
  Filled 2012-04-19: qty 2

## 2012-04-19 MED ORDER — ADULT MULTIVITAMIN W/MINERALS CH
1.0000 | ORAL_TABLET | Freq: Every day | ORAL | Status: DC
  Administered 2012-04-19 – 2012-04-22 (×4): 1 via ORAL
  Filled 2012-04-19 (×4): qty 1

## 2012-04-19 MED ORDER — GLYCOPYRROLATE 0.2 MG/ML IJ SOLN
INTRAMUSCULAR | Status: DC | PRN
  Administered 2012-04-19: .6 mg via INTRAVENOUS

## 2012-04-19 MED ORDER — METHOCARBAMOL 500 MG PO TABS
500.0000 mg | ORAL_TABLET | Freq: Four times a day (QID) | ORAL | Status: DC | PRN
  Administered 2012-04-21: 500 mg via ORAL

## 2012-04-19 MED ORDER — SORBITOL 70 % SOLN: 30.0000 mL | Freq: Every day | Status: DC | PRN

## 2012-04-19 MED ORDER — HYDROMORPHONE HCL PF 1 MG/ML IJ SOLN: 1.0000 mg | INTRAMUSCULAR | Status: DC | PRN

## 2012-04-19 MED ORDER — LISINOPRIL-HYDROCHLOROTHIAZIDE 10-12.5 MG PO TABS: 1.0000 | ORAL_TABLET | Freq: Every day | ORAL | Status: DC

## 2012-04-19 MED ORDER — METHOCARBAMOL 500 MG PO TABS
500.0000 mg | ORAL_TABLET | Freq: Four times a day (QID) | ORAL | Status: DC
Start: 2012-04-19 — End: 2012-04-22
  Administered 2012-04-19 – 2012-04-22 (×11): 500 mg via ORAL
  Filled 2012-04-19 (×17): qty 1

## 2012-04-19 MED ORDER — ZOLPIDEM TARTRATE 5 MG PO TABS: 5.0000 mg | ORAL_TABLET | Freq: Every evening | ORAL | Status: DC | PRN

## 2012-04-19 MED ORDER — FENTANYL CITRATE 0.05 MG/ML IJ SOLN
100.0000 ug | Freq: Once | INTRAMUSCULAR | Status: AC
  Administered 2012-04-19: 100 ug via INTRAVENOUS

## 2012-04-19 MED ORDER — NEOSTIGMINE METHYLSULFATE 1 MG/ML IJ SOLN
INTRAMUSCULAR | Status: DC | PRN
  Administered 2012-04-19: 4 mg via INTRAVENOUS

## 2012-04-19 MED ORDER — OXYCODONE-ACETAMINOPHEN 10-325 MG PO TABS: 1.0000 | ORAL_TABLET | Freq: Four times a day (QID) | ORAL | Status: DC | PRN

## 2012-04-19 MED ORDER — FENTANYL CITRATE 0.05 MG/ML IJ SOLN
INTRAMUSCULAR | Status: AC
  Filled 2012-04-19: qty 2

## 2012-04-19 MED ORDER — METHOCARBAMOL 100 MG/ML IJ SOLN
500.0000 mg | Freq: Four times a day (QID) | INTRAVENOUS | Status: DC | PRN
  Filled 2012-04-19: qty 5

## 2012-04-19 MED ORDER — FENTANYL CITRATE 0.05 MG/ML IJ SOLN
25.0000 ug | INTRAMUSCULAR | Status: DC | PRN
  Administered 2012-04-19 (×2): 50 ug via INTRAVENOUS

## 2012-04-19 MED ORDER — ONDANSETRON HCL 4 MG/2ML IJ SOLN
INTRAMUSCULAR | Status: DC | PRN
  Administered 2012-04-19: 4 mg via INTRAVENOUS

## 2012-04-19 MED ORDER — LIDOCAINE HCL (CARDIAC) 20 MG/ML IV SOLN
INTRAVENOUS | Status: DC | PRN
  Administered 2012-04-19: 70 mg via INTRAVENOUS

## 2012-04-19 MED ORDER — METOCLOPRAMIDE HCL 5 MG/ML IJ SOLN: 5.0000 mg | Freq: Three times a day (TID) | INTRAMUSCULAR | Status: DC | PRN

## 2012-04-19 MED ORDER — MIDAZOLAM HCL 2 MG/2ML IJ SOLN
1.0000 mg | INTRAMUSCULAR | Status: DC | PRN
  Administered 2012-04-19: 2 mg via INTRAVENOUS

## 2012-04-19 MED ORDER — ONDANSETRON HCL 4 MG/2ML IJ SOLN: 4.0000 mg | Freq: Four times a day (QID) | INTRAMUSCULAR | Status: DC | PRN

## 2012-04-19 MED ORDER — VANCOMYCIN HCL IN DEXTROSE 1-5 GM/200ML-% IV SOLN
1000.0000 mg | Freq: Two times a day (BID) | INTRAVENOUS | Status: DC
  Administered 2012-04-20 – 2012-04-22 (×5): 1000 mg via INTRAVENOUS
  Filled 2012-04-19 (×5): qty 200

## 2012-04-19 MED ORDER — METOCLOPRAMIDE HCL 10 MG PO TABS: 5.0000 mg | ORAL_TABLET | Freq: Three times a day (TID) | ORAL | Status: DC | PRN

## 2012-04-19 MED ORDER — POTASSIUM CHLORIDE IN NACL 20-0.45 MEQ/L-% IV SOLN
INTRAVENOUS | Status: DC
  Administered 2012-04-19 – 2012-04-21 (×2): via INTRAVENOUS
  Filled 2012-04-19 (×7): qty 1000

## 2012-04-19 MED ORDER — OXYCODONE HCL 5 MG PO TABS
5.0000 mg | ORAL_TABLET | ORAL | Status: DC | PRN
  Administered 2012-04-19 – 2012-04-21 (×10): 10 mg via ORAL
  Filled 2012-04-19 (×10): qty 2

## 2012-04-19 MED ORDER — MENTHOL 3 MG MT LOZG: 1.0000 | LOZENGE | OROMUCOSAL | Status: DC | PRN

## 2012-04-19 MED ORDER — LACTATED RINGERS IV SOLN
INTRAVENOUS | Status: DC | PRN
  Administered 2012-04-19 (×2): via INTRAVENOUS

## 2012-04-19 MED ORDER — OXYCODONE HCL 5 MG PO TABS: 5.0000 mg | ORAL_TABLET | Freq: Once | ORAL | Status: DC | PRN

## 2012-04-19 MED ORDER — SENNA 8.6 MG PO TABS
1.0000 | ORAL_TABLET | Freq: Two times a day (BID) | ORAL | Status: DC
  Administered 2012-04-19 – 2012-04-21 (×4): 8.6 mg via ORAL
  Filled 2012-04-19 (×7): qty 1

## 2012-04-19 MED ORDER — VANCOMYCIN HCL IN DEXTROSE 1-5 GM/200ML-% IV SOLN
1000.0000 mg | INTRAVENOUS | Status: AC
  Filled 2012-04-19: qty 200

## 2012-04-19 MED ORDER — PROMETHAZINE HCL 25 MG PO TABS: 25.0000 mg | ORAL_TABLET | Freq: Four times a day (QID) | ORAL | Status: DC | PRN

## 2012-04-19 MED ORDER — ACETAMINOPHEN 10 MG/ML IV SOLN
1000.0000 mg | Freq: Four times a day (QID) | INTRAVENOUS | Status: AC
  Administered 2012-04-19 – 2012-04-20 (×4): 1000 mg via INTRAVENOUS
  Filled 2012-04-19 (×4): qty 100

## 2012-04-19 MED ORDER — FENTANYL CITRATE 0.05 MG/ML IJ SOLN
INTRAMUSCULAR | Status: AC
  Filled 2012-04-19: qty 4

## 2012-04-19 MED ORDER — PROPOFOL 10 MG/ML IV BOLUS
INTRAVENOUS | Status: DC | PRN
  Administered 2012-04-19: 200 mg via INTRAVENOUS

## 2012-04-19 MED ORDER — VANCOMYCIN HCL 1000 MG IV SOLR
1500.0000 mg | INTRAVENOUS | Status: DC | PRN
  Administered 2012-04-19: 1500 mg via INTRAVENOUS

## 2012-04-19 MED ORDER — 0.9 % SODIUM CHLORIDE (POUR BTL) OPTIME
TOPICAL | Status: DC | PRN
  Administered 2012-04-19: 1000 mL

## 2012-04-19 MED ORDER — FENTANYL CITRATE 0.05 MG/ML IJ SOLN
INTRAMUSCULAR | Status: DC | PRN
  Administered 2012-04-19 (×6): 50 ug via INTRAVENOUS
  Administered 2012-04-19: 25 ug via INTRAVENOUS
  Administered 2012-04-19 (×2): 50 ug via INTRAVENOUS
  Administered 2012-04-19: 75 ug via INTRAVENOUS

## 2012-04-19 MED ORDER — MIDAZOLAM HCL 5 MG/5ML IJ SOLN
INTRAMUSCULAR | Status: DC | PRN
  Administered 2012-04-19: 2 mg via INTRAVENOUS

## 2012-04-19 MED ORDER — DOCUSATE SODIUM 100 MG PO CAPS
100.0000 mg | ORAL_CAPSULE | Freq: Two times a day (BID) | ORAL | Status: DC
  Administered 2012-04-19 – 2012-04-21 (×4): 100 mg via ORAL
  Filled 2012-04-19 (×6): qty 1

## 2012-04-19 MED ORDER — LISINOPRIL 10 MG PO TABS
10.0000 mg | ORAL_TABLET | Freq: Every day | ORAL | Status: DC
  Administered 2012-04-19 – 2012-04-22 (×3): 10 mg via ORAL
  Filled 2012-04-19 (×4): qty 1

## 2012-04-19 MED ORDER — ENOXAPARIN SODIUM 40 MG/0.4ML ~~LOC~~ SOLN: 40.0000 mg | SUBCUTANEOUS | Status: DC

## 2012-04-19 MED ORDER — ALUM & MAG HYDROXIDE-SIMETH 200-200-20 MG/5ML PO SUSP: 30.0000 mL | ORAL | Status: DC | PRN

## 2012-04-19 MED ORDER — ENOXAPARIN SODIUM 30 MG/0.3ML ~~LOC~~ SOLN
30.0000 mg | Freq: Two times a day (BID) | SUBCUTANEOUS | Status: DC
  Administered 2012-04-20 – 2012-04-22 (×5): 30 mg via SUBCUTANEOUS
  Filled 2012-04-19 (×7): qty 0.3

## 2012-04-19 MED ORDER — ONDANSETRON HCL 4 MG PO TABS: 4.0000 mg | ORAL_TABLET | Freq: Four times a day (QID) | ORAL | Status: DC | PRN

## 2012-04-19 MED ORDER — STERILE WATER FOR INJECTION IJ SOLN
Status: DC | PRN
  Administered 2012-04-19: 15:00:00 via INTRAVESICAL

## 2012-04-19 MED ORDER — MUPIROCIN 2 % EX OINT
TOPICAL_OINTMENT | Freq: Once | CUTANEOUS | Status: AC
  Administered 2012-04-19: 1 via NASAL

## 2012-04-19 MED ORDER — RIFAMPIN 300 MG PO CAPS
300.0000 mg | ORAL_CAPSULE | Freq: Every day | ORAL | Status: DC
  Administered 2012-04-19 – 2012-04-22 (×4): 300 mg via ORAL
  Filled 2012-04-19 (×4): qty 1

## 2012-04-19 MED ORDER — OXYCODONE HCL 5 MG/5ML PO SOLN: 5.0000 mg | Freq: Once | ORAL | Status: DC | PRN

## 2012-04-19 MED ORDER — POLYETHYLENE GLYCOL 3350 17 G PO PACK: 17.0000 g | PACK | Freq: Every day | ORAL | Status: DC | PRN

## 2012-04-19 MED ORDER — BUPIVACAINE HCL (PF) 0.5 % IJ SOLN
INTRAMUSCULAR | Status: DC | PRN
  Administered 2012-04-19: 30 mL

## 2012-04-19 MED ORDER — DEXTROSE 5 % IV SOLN
1.0000 g | INTRAVENOUS | Status: DC
  Administered 2012-04-19 – 2012-04-21 (×3): 1 g via INTRAVENOUS
  Filled 2012-04-19 (×4): qty 10

## 2012-04-19 MED ORDER — CALCIUM CARBONATE 600 MG PO TABS
1200.0000 mg | ORAL_TABLET | Freq: Every day | ORAL | Status: DC
  Administered 2012-04-19 – 2012-04-22 (×4): 1200 mg via ORAL
  Filled 2012-04-19 (×4): qty 2

## 2012-04-19 MED ORDER — MUPIROCIN 2 % EX OINT
TOPICAL_OINTMENT | CUTANEOUS | Status: AC
  Filled 2012-04-19: qty 22

## 2012-04-19 MED ORDER — PANTOPRAZOLE SODIUM 40 MG PO TBEC
40.0000 mg | DELAYED_RELEASE_TABLET | Freq: Every day | ORAL | Status: DC
  Administered 2012-04-20 – 2012-04-22 (×3): 40 mg via ORAL
  Filled 2012-04-19 (×3): qty 1

## 2012-04-19 MED ORDER — OXYCHLOROSENE SODIUM POWD
Status: DC
  Filled 2012-04-19: qty 2

## 2012-04-19 MED ORDER — SODIUM CHLORIDE 0.9 % IV SOLN
1500.0000 mg | INTRAVENOUS | Status: DC
  Filled 2012-04-19: qty 1500

## 2012-04-19 MED ORDER — ACETAMINOPHEN 650 MG RE SUPP: 650.0000 mg | Freq: Four times a day (QID) | RECTAL | Status: DC | PRN

## 2012-04-19 MED ORDER — MAGNESIUM CITRATE PO SOLN
1.0000 | Freq: Once | ORAL | Status: AC | PRN
  Filled 2012-04-19: qty 296

## 2012-04-19 MED ORDER — HYDROGEN PEROXIDE 3 % EX SOLN
CUTANEOUS | Status: DC | PRN
  Administered 2012-04-19: 1

## 2012-04-19 MED ORDER — PROMETHAZINE HCL 25 MG/ML IJ SOLN: 6.2500 mg | INTRAMUSCULAR | Status: DC | PRN

## 2012-04-19 MED ORDER — TRIAMCINOLONE ACETONIDE 0.1 % EX CREA
1.0000 "application " | TOPICAL_CREAM | Freq: Two times a day (BID) | CUTANEOUS | Status: DC | PRN
  Filled 2012-04-19: qty 15

## 2012-04-19 MED ORDER — HYDROCHLOROTHIAZIDE 12.5 MG PO CAPS
12.5000 mg | ORAL_CAPSULE | Freq: Every day | ORAL | Status: DC
  Administered 2012-04-19 – 2012-04-22 (×3): 12.5 mg via ORAL
  Filled 2012-04-19 (×5): qty 1

## 2012-04-19 MED ORDER — ACETAMINOPHEN 325 MG PO TABS
650.0000 mg | ORAL_TABLET | Freq: Four times a day (QID) | ORAL | Status: DC | PRN
  Administered 2012-04-21: 650 mg via ORAL
  Filled 2012-04-19: qty 2

## 2012-04-19 MED ORDER — ROCURONIUM BROMIDE 100 MG/10ML IV SOLN
INTRAVENOUS | Status: DC | PRN
  Administered 2012-04-19: 50 mg via INTRAVENOUS

## 2012-04-19 MED ORDER — SODIUM CHLORIDE 0.9 % IR SOLN
Status: DC | PRN
  Administered 2012-04-19: 3000 mL

## 2012-04-19 MED ORDER — PHENOL 1.4 % MT LIQD: 1.0000 | OROMUCOSAL | Status: DC | PRN

## 2012-04-19 SURGICAL SUPPLY — 68 items
APL SKNCLS STERI-STRIP NONHPOA (GAUZE/BANDAGES/DRESSINGS)
BANDAGE ELASTIC 4 VELCRO ST LF (GAUZE/BANDAGES/DRESSINGS) ×1 IMPLANT
BANDAGE ELASTIC 6 VELCRO ST LF (GAUZE/BANDAGES/DRESSINGS) ×3 IMPLANT
BANDAGE ESMARK 6X9 LF (GAUZE/BANDAGES/DRESSINGS) ×1 IMPLANT
BENZOIN TINCTURE PRP APPL 2/3 (GAUZE/BANDAGES/DRESSINGS) ×1 IMPLANT
BLADE SAG 18X100X1.27 (BLADE) ×1 IMPLANT
BNDG CMPR 9X6 STRL LF SNTH (GAUZE/BANDAGES/DRESSINGS)
BNDG ESMARK 6X9 LF (GAUZE/BANDAGES/DRESSINGS)
BOOTCOVER CLEANROOM LRG (PROTECTIVE WEAR) ×4 IMPLANT
BOWL SMART MIX CTS (DISPOSABLE) IMPLANT
CLOTH BEACON ORANGE TIMEOUT ST (SAFETY) ×2 IMPLANT
CONT SPEC 4OZ CLIKSEAL STRL BL (MISCELLANEOUS) ×1 IMPLANT
COVER SURGICAL LIGHT HANDLE (MISCELLANEOUS) ×2 IMPLANT
CUFF TOURNIQUET SINGLE 34IN LL (TOURNIQUET CUFF) IMPLANT
DRAPE EXTREMITY T 121X128X90 (DRAPE) ×2 IMPLANT
DRAPE INCISE IOBAN 66X45 STRL (DRAPES) ×1 IMPLANT
DRAPE PROXIMA HALF (DRAPES) ×1 IMPLANT
DRAPE U-SHAPE 47X51 STRL (DRAPES) ×2 IMPLANT
DRSG PAD ABDOMINAL 8X10 ST (GAUZE/BANDAGES/DRESSINGS) ×2 IMPLANT
DURAPREP 26ML APPLICATOR (WOUND CARE) ×4 IMPLANT
ELECT CAUTERY BLADE 6.4 (BLADE) ×2 IMPLANT
ELECT REM PT RETURN 9FT ADLT (ELECTROSURGICAL) ×2
ELECTRODE REM PT RTRN 9FT ADLT (ELECTROSURGICAL) ×1 IMPLANT
EVACUATOR 1/8 PVC DRAIN (DRAIN) IMPLANT
EVACUATOR 3/16  PVC DRAIN (DRAIN) ×1
EVACUATOR 3/16 PVC DRAIN (DRAIN) IMPLANT
FACESHIELD LNG OPTICON STERILE (SAFETY) ×2 IMPLANT
GAUZE XEROFORM 5X9 LF (GAUZE/BANDAGES/DRESSINGS) ×1 IMPLANT
GLOVE BIOGEL PI IND STRL 8 (GLOVE) ×2 IMPLANT
GLOVE BIOGEL PI INDICATOR 8 (GLOVE) ×2
GLOVE ORTHO TXT STRL SZ7.5 (GLOVE) ×2 IMPLANT
GLOVE SURG ORTHO 8.0 STRL STRW (GLOVE) ×2 IMPLANT
GOWN STRL REIN XL XLG (GOWN DISPOSABLE) ×2 IMPLANT
HANDPIECE INTERPULSE COAX TIP (DISPOSABLE) ×4
HOOD PEEL AWAY FACE SHEILD DIS (HOOD) ×5 IMPLANT
HYDROGEN PEROXIDE 16OZ (MISCELLANEOUS) ×1 IMPLANT
IMMOBILIZER KNEE 22 UNIV (SOFTGOODS) ×1 IMPLANT
INSERT STABILIZED 10MM (Knees) ×1 IMPLANT
KIT BASIN OR (CUSTOM PROCEDURE TRAY) ×2 IMPLANT
KIT ROOM TURNOVER OR (KITS) ×2 IMPLANT
MANIFOLD NEPTUNE II (INSTRUMENTS) ×3 IMPLANT
NS IRRIG 1000ML POUR BTL (IV SOLUTION) ×2 IMPLANT
PACK TOTAL JOINT (CUSTOM PROCEDURE TRAY) ×2 IMPLANT
PAD ARMBOARD 7.5X6 YLW CONV (MISCELLANEOUS) ×4 IMPLANT
PAD CAST 4YDX4 CTTN HI CHSV (CAST SUPPLIES) ×1 IMPLANT
PADDING CAST COTTON 4X4 STRL (CAST SUPPLIES) ×2
PADDING CAST COTTON 6X4 STRL (CAST SUPPLIES) ×2 IMPLANT
SET HNDPC FAN SPRY TIP SCT (DISPOSABLE) ×1 IMPLANT
SPONGE GAUZE 4X4 12PLY (GAUZE/BANDAGES/DRESSINGS) ×2 IMPLANT
STAPLER VISISTAT 35W (STAPLE) IMPLANT
STRIP CLOSURE SKIN 1/2X4 (GAUZE/BANDAGES/DRESSINGS) ×1 IMPLANT
SUCTION FRAZIER TIP 10 FR DISP (SUCTIONS) ×1 IMPLANT
SUT ETHILON 2 0 FS 18 (SUTURE) ×2 IMPLANT
SUT ETHILON 3 0 PS 1 (SUTURE) ×2 IMPLANT
SUT MNCRL AB 4-0 PS2 18 (SUTURE) IMPLANT
SUT PDS AB 1 CTX 36 (SUTURE) ×2 IMPLANT
SUT PDS AB 2-0 CT1 27 (SUTURE) ×2 IMPLANT
SUT VIC AB 0 CT1 27 (SUTURE) ×2
SUT VIC AB 0 CT1 27XBRD ANBCTR (SUTURE) ×1 IMPLANT
SUT VIC AB 2-0 CT1 27 (SUTURE) ×2
SUT VIC AB 2-0 CT1 TAPERPNT 27 (SUTURE) ×1 IMPLANT
SUT VIC AB 3-0 SH 18 (SUTURE) ×2 IMPLANT
SYR 30ML LL (SYRINGE) ×1 IMPLANT
TOWEL OR 17X24 6PK STRL BLUE (TOWEL DISPOSABLE) ×2 IMPLANT
TOWEL OR 17X26 10 PK STRL BLUE (TOWEL DISPOSABLE) ×2 IMPLANT
TRAY FOLEY CATH 14FR (SET/KITS/TRAYS/PACK) IMPLANT
WATER STERILE IRR 1000ML POUR (IV SOLUTION) ×4 IMPLANT
YANKAUER SUCT BULB TIP NO VENT (SUCTIONS) ×1 IMPLANT

## 2012-04-19 NOTE — Progress Notes (Signed)
[facility]  Patient Status: New  [facility] is providing the following services: RN, PT and Home Infusion Services (teaching and education will be done by nurse in the home with patient and caregiver)  Referral from MD office. Thank you!  If patient discharges after hours, please call [phone].   Jodene Nam 04/19/2012, 2:51 PM

## 2012-04-19 NOTE — Consult Note (Signed)
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   Comment- Likelihood of saving her prosthetic is good as infection was recognized early. Will add rifampin, I cautioned her about her secretions turning red due to this.  Await her repeat Cx from OR.   Thank you so much for this interesting consult,   Johny Sax [phone]  Maria Holt is an 52 y.o. female.  HPI: 52 year old female with a history of hypertension and a previous anterior cruciate ligament repair in the 1980s. She underwent right total knee replacement 03/19/2012. She was discharged home on December 4. By December 20 she had had her staples out however had begun to notice low grade temperatures around 99. She began to notice seeping from her wound as well as foul odor. She was seen by a home nurse who also noticed that her wound was hot. She was felt to have possibly localized stitch infection. She improved however by December 24 she noticed more wound discharge. She was felt to be engaged in too much activity by her physical therapist. She was still having fevers at that time however she was able to undergo normal weightbearing with her physical therapy. By December 27 she was working on extensions with her knee and afterwards began to have difficulty walking by December 31 she was barely able to bear weight at all on her right knee she. She was seen in her orthopedists office and underwent aspiration of her knee. This showed 25,000 white cells the Gram stain was negative (Cx negative final today). She was admitted to the hospital on January 3 and underwent an irrigation and debridement of her right knee as well as exchange of parts. She had Cx done today in OR off [facility].    Past Medical History  Diagnosis Date  . GERD (gastroesophageal reflux disease)   . Hypertension   . Chronic lower back pain   . Post-traumatic osteoarthritis of right knee 1989    "tore ACL; failed reconstruction" (03/19/2012)  . OSA on CPAP 2005    CPAP-rarely uses  . De Quervain's disease (tenosynovitis)     "left hand" (03/19/2012)  . 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 Anterior cruciate ligament repair 1989    "right" (03/19/2012)  . Knee arthroscopy 07/2001    RIGHT  . Anterior cervical decomp/discectomy fusion 01/2001  . Total knee arthroplasty 03/19/2012    "right" (03/19/2012)  . Total knee arthroplasty 03/19/2012    Procedure: TOTAL KNEE ARTHROPLASTY;  Surgeon: Eulas Post, MD;  Location: [facility] OR;  Service: Orthopedics;  Laterality: Right;  . Back surgery     C 5-6 fusion     Allergies  Allergen Reactions  . Dilaudid (Hydromorphone Hcl) Hives and Other (See Comments)    "I don't remember the reaction; just know it was a no no" (03/19/2012)  . Demerol (Meperidine) Hives  . Morphine And Related Hives  . Hibiclens (Chlorhexidine Gluconate)     CHG wipes make her itch  . Sulfa Antibiotics Other (See Comments)    "don't remember the reaction; Dr. just told me I was allergic" (03/19/2012)    Medications:  Scheduled:    . mupirocin ointment        Total days  of antibiotics 1 (vanco/ceftriaxone)          Social History:  reports that she has never smoked. She has never used smokeless tobacco. She reports that she does not drink alcohol or use illicit drugs.  History reviewed. No pertinent family history. Father with CAD  General ROS: normal vision, decreased appetite, lost 7-8# this week, constipation due to narcotics, normal urination. see HPI.   Blood pressure 141/68, pulse 93, temperature 98.5 F (36.9 C), temperature source Oral, resp. rate 17, last menstrual period 04/09/2012, SpO2  100.00%. General appearance: alert, cooperative and no distress Eyes: negative findings: pupils equal, round, reactive to light and accomodation Throat: normal findings: oropharynx pink & moist without lesions or evidence of thrush Neck: no adenopathy and supple, symmetrical, trachea midline Lungs: clear to auscultation bilaterally Heart: regular rate and rhythm Abdomen: normal findings: bowel sounds normal and soft, non-tender Extremities: edema none Normal light touch BLE. Her RLE is wrapped, a drain is in place.    Results for orders placed during the hospital encounter of 04/19/12 (from the past 48 hour(s))  BASIC METABOLIC PANEL     Status: Abnormal   Collection Time   04/19/12  9:57 AM      Component Value Range Comment   Sodium 137  135 - 145 mEq/L    Potassium 3.7  3.5 - 5.1 mEq/L    Chloride 95 (*) 96 - 112 mEq/L    CO2 27  19 - 32 mEq/L    Glucose, Bld 123 (*) 70 - 99 mg/dL    BUN 7  6 - 23 mg/dL    Creatinine, Ser 0.45  0.50 - 1.10 mg/dL    Calcium 9.6  8.4 - 40.9 mg/dL    GFR calc non Af Amer >90  >90 mL/min    GFR calc Af Amer >90  >90 mL/min   CBC     Status: Abnormal   Collection Time   04/19/12  9:57 AM      Component Value Range Comment   WBC 10.6 (*) 4.0 - 10.5 K/uL    RBC 4.21  3.87 - 5.11 MIL/uL    Hemoglobin 10.9 (*) 12.0 - 15.0 g/dL    HCT 81.1 (*) 91.4 - 46.0 %    MCV 80.8  78.0 - 100.0 fL    MCH 25.9 (*) 26.0 - 34.0 pg    MCHC 32.1  30.0 - 36.0 g/dL    RDW 78.2  95.6 - 21.3 %    Platelets 270  150 - 400 K/uL   PROTIME-INR     Status: Abnormal   Collection Time   04/19/12  9:57 AM      Component Value Range Comment   Prothrombin Time 21.8 (*) 11.6 - 15.2 seconds    INR 1.99 (*) 0.00 - 1.49   TYPE AND SCREEN     Status: Normal   Collection Time   04/19/12 10:00 AM      Component Value Range Comment   ABO/RH(D) O POS      Antibody Screen NEG      Sample Expiration 04/22/2012     SURGICAL PCR SCREEN     Status: Normal   Collection Time   04/19/12 10:25  AM      Component Value Range Comment   MRSA, PCR NEGATIVE  NEGATIVE    Staphylococcus aureus NEGATIVE  NEGATIVE       Component Value Date/Time   SDES URINE, RANDOM 03/08/2012 1055   SPECREQUEST NONE  03/08/2012 1055   CULT NO GROWTH 03/08/2012 1055   REPTSTATUS 03/09/2012 FINAL 03/08/2012 1055   Ir Fluoro Guide Cv Line Right  04/18/2012  *RADIOLOGY REPORT*  Clinical Data: Postop knee replacement, infection, access for antibiotics  PICC LINE PLACEMENT WITH ULTRASOUND AND FLUOROSCOPIC  GUIDANCE  Fluoroscopy Time: 0.3 minutes.  The right arm was prepped with chlorhexidine, draped in the usual sterile fashion using maximum barrier technique (cap and mask, sterile gown, sterile gloves, large sterile sheet, hand hygiene and cutaneous antisepsis) and infiltrated locally with 1% Lidocaine.  Ultrasound demonstrated patency of the right brachial vein, and this was documented with an image.  Under real-time ultrasound guidance, this vein was accessed with a 21 gauge micropuncture needle and image documentation was performed.  The needle was exchanged over a guidewire for a peel-away sheath through which a 5 [place] single lumen PICC trimmed to 38 cm was advanced, positioned with its tip at the lower SVC/right atrial junction.  Fluoroscopy during the procedure and fluoro spot radiograph confirms appropriate catheter position.  The catheter was flushed, secured to the skin with Prolene sutures, and covered with a sterile dressing.  Complications:  No immediate  IMPRESSION: Successful right arm PICC line placement with ultrasound and fluoroscopic guidance.  The catheter is ready for use.   Original Report Authenticated By: Judie Petit. Miles Costain, M.D.    Ir US Guide Vasc Access Right  04/18/2012  *RADIOLOGY REPORT*  Clinical Data: Postop knee replacement, infection, access for antibiotics  PICC LINE PLACEMENT WITH ULTRASOUND AND FLUOROSCOPIC  GUIDANCE  Fluoroscopy Time: 0.3 minutes.  The right arm was prepped with chlorhexidine,  draped in the usual sterile fashion using maximum barrier technique (cap and mask, sterile gown, sterile gloves, large sterile sheet, hand hygiene and cutaneous antisepsis) and infiltrated locally with 1% Lidocaine.  Ultrasound demonstrated patency of the right brachial vein, and this was documented with an image.  Under real-time ultrasound guidance, this vein was accessed with a 21 gauge micropuncture needle and image documentation was performed.  The needle was exchanged over a guidewire for a peel-away sheath through which a 5 [place] single lumen PICC trimmed to 38 cm was advanced, positioned with its tip at the lower SVC/right atrial junction.  Fluoroscopy during the procedure and fluoro spot radiograph confirms appropriate catheter position.  The catheter was flushed, secured to the skin with Prolene sutures, and covered with a sterile dressing.  Complications:  No immediate  IMPRESSION: Successful right arm PICC line placement with ultrasound and fluoroscopic guidance.  The catheter is ready for use.   Original Report Authenticated By: Judie Petit. Miles Costain, M.D.    Recent Results (from the past 240 hour(s))  SURGICAL PCR SCREEN     Status: Normal   Collection Time   04/19/12 10:25 AM      Component Value Range Status Comment   MRSA, PCR NEGATIVE  NEGATIVE Final    Staphylococcus aureus NEGATIVE  NEGATIVE Final       04/19/2012, 5:51 PM     LOS: 0 days

## 2012-04-19 NOTE — H&P (Signed)
PREOPERATIVE H&P  Chief Complaint: right knee infection s/p TKA  HPI: Maria Holt is a 52 y.o. female who presents for preoperative history and physical with a diagnosis of right knee infection s/p TKA. Symptoms are rated as moderate to severe, and have been worsening.  This is significantly impairing activities of daily living.  She has elected for surgical management. She had a right total knee replacement done one month ago. Prior to that she had an anterior cruciate ligament reconstruction done in the remote past, which apparently had trouble healing, and was told that she had a "reaction to the sutures" because she had ongoing drainage from her wound, which ultimately did resolve.  She presented back to our office with increasing redness, decreasing mobility, increasing pain, and underwent knee aspiration which demonstrated 25,000 white cells, had an elevated CRP and sedimentation rate, with a normal peripheral white blood cell count. Gram stain was negative, and culture has been negative to date, however given the overall concerning clinical picture, we have recommended surgical debridement and polyethylene exchange with IV antibiotics.   Past Medical History  Diagnosis Date  . GERD (gastroesophageal reflux disease)   . Hypertension   . Chronic lower back pain   . Post-traumatic osteoarthritis of right knee 1989    "tore ACL; failed reconstruction" (03/19/2012)  . OSA on CPAP 2005    CPAP-rarely uses  . De Quervain's disease (tenosynovitis)     "left hand" (03/19/2012)   Past Surgical History  Procedure Date  . Anterior cruciate ligament repair 1989    "right" (03/19/2012)  . Knee arthroscopy 07/2001    RIGHT  . Anterior cervical decomp/discectomy fusion 01/2001  . Total knee arthroplasty 03/19/2012    "right" (03/19/2012)  . Total knee arthroplasty 03/19/2012    Procedure: TOTAL KNEE ARTHROPLASTY;  Surgeon: Eulas Post, MD;  Location: [facility] OR;  Service: Orthopedics;  Laterality:  Right;  . Back surgery     C 5-6 fusion   History   Social History  . Marital Status: Married    Spouse Name: N/A    Number of Children: N/A  . Years of Education: N/A   Social History Main Topics  . Smoking status: Never Smoker   . Smokeless tobacco: Never Used  . Alcohol Use: No  . Drug Use: No  . Sexually Active: Yes   Other Topics Concern  . None   Social History Narrative  . None   History reviewed. No pertinent family history. Allergies  Allergen Reactions  . Dilaudid (Hydromorphone Hcl) Hives and Other (See Comments)    "I don't remember the reaction; just know it was a no no" (03/19/2012)  . Demerol (Meperidine) Hives  . Morphine And Related Hives  . Hibiclens (Chlorhexidine Gluconate)     CHG wipes make her itch  . Sulfa Antibiotics Other (See Comments)    "don't remember the reaction; Dr. just told me I was allergic" (03/19/2012)   Prior to Admission medications   Medication Sig Start Date End Date Taking? Authorizing Provider  calcium carbonate (OS-CAL) 600 MG TABS Take 1,200 mg by mouth daily.   Yes Historical Provider, MD  lansoprazole (PREVACID) 15 MG capsule Take 15 mg by mouth daily.   Yes Historical Provider, MD  lisinopril-hydrochlorothiazide (PRINZIDE,ZESTORETIC) 10-12.5 MG per tablet Take 1 tablet by mouth daily.   Yes Historical Provider, MD  methocarbamol (ROBAXIN) 500 MG tablet Take 1 tablet (500 mg total) by mouth 4 (four) times daily. 03/19/12  Yes Murle Hellstrom P  Dion Saucier, MD  Multiple Vitamin (MULTIVITAMIN WITH MINERALS) TABS Take 1 tablet by mouth daily.   Yes Historical Provider, MD  oxyCODONE-acetaminophen (PERCOCET) 10-325 MG per tablet Take 1-2 tablets by mouth every 6 (six) hours as needed for pain. MAXIMUM TOTAL ACETAMINOPHEN DOSE IS 4000 MG PER DAY 03/20/12  Yes Eulas Post, MD  promethazine (PHENERGAN) 25 MG tablet Take 1 tablet (25 mg total) by mouth every 6 (six) hours as needed for nausea. 03/19/12  Yes Eulas Post, MD  triamcinolone  cream (KENALOG) 0.1 % Apply 1 application topically 2 (two) times daily as needed. For eczema   Yes Historical Provider, MD  warfarin (COUMADIN) 5 MG tablet Take 1 tablet (5 mg total) by mouth daily. 03/20/12  Yes Eulas Post, MD     Positive ROS: All other systems have been reviewed and were otherwise negative with the exception of those mentioned in the HPI and as above.  Physical Exam: General: Alert, no acute distress Cardiovascular: No pedal edema Respiratory: No cyanosis, no use of accessory musculature GI: No organomegaly, abdomen is soft and non-tender Skin: No lesions in the area of chief complaint Neurologic: Sensation intact distally Psychiatric: Patient is competent for consent with normal mood and affect Lymphatic: No axillary or cervical lymphadenopathy  MUSCULOSKELETAL: Right knee has edema around the incision with painful range of motion. There is no gross drainage.  Assessment: right knee infection s/p TKA  Plan: Plan for Procedure(s): IRRIGATION AND DEBRIDEMENT KNEE WITH POLY EXCHANGE  The risks benefits and alternatives were discussed with the patient including but not limited to the risks of nonoperative treatment, versus surgical intervention including infection, bleeding, nerve injury,  blood clots, cardiopulmonary complications, morbidity, mortality, among others, and they were willing to proceed. We've also discussed the potential that she may need multiple stage I&D with removal of the implants if the polyethylene exchange does not work.  Sumedha Munnerlyn P, MD Cell [phone] Pager [phone]  04/19/2012 1:21 PM

## 2012-04-19 NOTE — Anesthesia Postprocedure Evaluation (Signed)
  Anesthesia Post-op Note  Patient: Maria Holt  Procedure(s) Performed: Procedure(s) (LRB) with comments: IRRIGATION AND DEBRIDEMENT KNEE WITH POLY EXCHANGE (Right) - irrigation and debridement right knee arthroplasty with poly exhange  Patient Location: PACU  Anesthesia Type:General  Level of Consciousness: awake  Airway and Oxygen Therapy: Patient Spontanous Breathing  Post-op Pain: mild  Post-op Assessment: Post-op Vital signs reviewed  Post-op Vital Signs: stable  Complications: No apparent anesthesia complications

## 2012-04-19 NOTE — Op Note (Signed)
04/19/2012  3:30 PM  PATIENT:  Maria Holt    PRE-OPERATIVE DIAGNOSIS:  right knee infection s/p TKA  POST-OPERATIVE DIAGNOSIS:  Same  PROCEDURE:  IRRIGATION AND DEBRIDEMENT KNEE WITH POLY EXCHANGE  SURGEON:  Eulas Post, MD  PHYSICIAN ASSISTANT: Janace Litten, OPA-C, present and scrubbed throughout the case, critical for completion in a timely fashion, and for retraction, instrumentation, and closure.  ANESTHESIA:   General  PREOPERATIVE INDICATIONS:  Maria Holt is a  52 y.o. female with a diagnosis of right knee infection s/p TKA who  elected for surgical management.  She has a past history of a surgical wound from a remote anterior cruciate ligament reconstruction that was done open, and had some type of postoperative problem, and was told that she was "allergic to Vicryl" because her wound continued to drain. She did well for the first couple of weeks after her knee replacement, but over the course of the past week has had generalized malaise, increasing stiffness, decreasing mobility, and increasing pain. Aspiration of her knee demonstrated 25,000 white cells, and she had an elevated sedimentation rate and CRP. For these reasons I was concerned that she may have an infection, and recommended surgical intervention, in order to rapidly address the potential for infection. The Gram stain and culture and sensitivity from the sample taken in the office as still not grown out anything, but given the overall clinical picture, I felt that surgical intervention was warranted.  The risks benefits and alternatives were discussed with the patient preoperatively including but not limited to the risks of recurrent infection, bleeding, nerve injury, cardiopulmonary complications, the need for revision surgery, among others, and the patient was willing to proceed.  OPERATIVE IMPLANTS: Depuy 10 mm polyethylene insert size 3 mm  OPERATIVE FINDINGS: There was a significant joint effusion, with some  seropurulent type fluid, with some "cheesy" fat necrosis in the subcutaneous tissue. The implants were well fixed. There was no involvement of the bone that I can appreciate.  OPERATIVE PROCEDURE: The patient was brought to the operating room and placed in the supine position. General anesthesia was administered. IV antibiotics were given. The right lower extremity was prepped and draped in usual sterile fashion. The leg was elevated and exsanguinated and a tourniquet was inflated. Total tourniquet time was approximately one hour and 20 minutes. Time out was performed. Anterior incision through her previous incision was carried out. I elevated the subcutaneous flaps, and then exposed the quadriceps extensor mechanism. This was incised in its previous line. I encountered a fair amount of fluid at this point, and this was sent for Gram stain, culture and sensitivity. I performed the arthrotomy, and then took a sample of synovial tissue, and sent this also for Gram stain culture and sensitivity. A complete synovectomy was performed of the superior medial and lateral and posterior compartments. I also removed the polyethylene component for better access throughout the knee.  This was carried out with a scalpel, as well as a rongeur, and also using a curet.  I then irrigated 3 L of fluid through the knee, and then reevaluated and removed any abnormal-appearing tissue. I then used multiple rounds of hydrogen peroxide and clorpactin followed by normal saline wash. A total of 9 L of saline was irrigated through the knee. Complete debridement was carried out.  I then replaced the polyethylene component with a new polyethylene component. This seated completely.  I placed 2 deep drains, and repaired the parapatellar tissue and quadriceps tendon with #1  PDS, followed by 2-0 PDS for the subcutaneous tissue, followed by nylon for the skin. Sterile gauze was applied. She was awakened and the tourniquet released and she  returned to the PACU in stable and satisfactory condition. There no complications.

## 2012-04-19 NOTE — Anesthesia Preprocedure Evaluation (Signed)
Anesthesia Evaluation  Patient identified by MRN, date of birth, ID band Patient awake    Reviewed: Allergy & Precautions, H&P , Patient's Chart, lab work & pertinent test results, reviewed documented beta blocker date and time   History of Anesthesia Complications Negative for: history of anesthetic complications  Airway Mallampati: II TM Distance: >3 FB Neck ROM: full    Dental No notable dental hx. (+) Teeth Intact and Dental Advidsory Given   Pulmonary neg pulmonary ROS, sleep apnea ,  breath sounds clear to auscultation  Pulmonary exam normal       Cardiovascular Exercise Tolerance: Good hypertension, negative cardio ROS  Rhythm:regular Rate:Normal     Neuro/Psych negative neurological ROS  negative psych ROS   GI/Hepatic negative GI ROS, Neg liver ROS, GERD-  Controlled,  Endo/Other  negative endocrine ROS  Renal/GU      Musculoskeletal   Abdominal   Peds  Hematology negative hematology ROS (+)   Anesthesia Other Findings   Reproductive/Obstetrics negative OB ROS                           Anesthesia Physical Anesthesia Plan  ASA: II  Anesthesia Plan: General ETT   Post-op Pain Management: MAC Combined w/ Regional for Post-op pain   Induction:   Airway Management Planned:   Additional Equipment:   Intra-op Plan:   Post-operative Plan:   Informed Consent: I have reviewed the patients History and Physical, chart, labs and discussed the procedure including the risks, benefits and alternatives for the proposed anesthesia with the patient or authorized representative who has indicated his/her understanding and acceptance.   Dental Advisory Given  Plan Discussed with: CRNA and Surgeon  Anesthesia Plan Comments:         Anesthesia Quick Evaluation

## 2012-04-19 NOTE — Preoperative (Signed)
Beta Blockers   Reason not to administer Beta Blockers:Not Applicable 

## 2012-04-19 NOTE — Transfer of Care (Signed)
Immediate Anesthesia Transfer of Care Note  Patient: Maria Holt  Procedure(s) Performed: Procedure(s) (LRB) with comments: IRRIGATION AND DEBRIDEMENT KNEE WITH POLY EXCHANGE (Right) - irrigation and debridement right knee arthroplasty with poly exhange  Patient Location: PACU  Anesthesia Type:General  Level of Consciousness: awake, alert , oriented and patient cooperative  Airway & Oxygen Therapy: Patient Spontanous Breathing and Patient connected to nasal cannula oxygen  Post-op Assessment: Report given to PACU RN and Post -op Vital signs reviewed and stable  Post vital signs: Reviewed and stable  Complications: No apparent anesthesia complications

## 2012-04-19 NOTE — Progress Notes (Signed)
ANTIBIOTIC CONSULT NOTE - INITIAL  Pharmacy Consult for Vancomycin Indication: septic arthritis (R knee)  Allergies  Allergen Reactions  . Dilaudid (Hydromorphone Hcl) Hives and Other (See Comments)    "I don't remember the reaction; just know it was a no no" (03/19/2012)  . Demerol (Meperidine) Hives  . Morphine And Related Hives  . Hibiclens (Chlorhexidine Gluconate)     CHG wipes make her itch  . Sulfa Antibiotics Other (See Comments)    "don't remember the reaction; Dr. just told me I was allergic" (03/19/2012)    Patient Measurements: Height: 5' 6.14" (168 cm) Weight: 260 lb 2.3 oz (118 kg) IBW/kg (Calculated) : 59.63   Vital Signs: Temp: 98 F (36.7 C) (01/03 1800) Temp src: Oral (01/03 0954) BP: 147/75 mmHg (01/03 1800) Pulse Rate: 93  (01/03 1730) Intake/Output from previous day:   Intake/Output from this shift: Total I/O In: 1200 [I.V.:1200] Out: 25 [Blood:25]  Labs:  [facility] 04/19/12 0957  WBC 10.6*  HGB 10.9*  PLT 270  LABCREA --  CREATININE 0.62   Estimated Creatinine Clearance: 111.5 ml/min (by C-G formula based on Cr of 0.62).    Microbiology: Recent Results (from the past 720 hour(s))  SURGICAL PCR SCREEN     Status: Normal   Collection Time   04/19/12 10:25 AM      Component Value Range Status Comment   MRSA, PCR NEGATIVE  NEGATIVE Final    Staphylococcus aureus NEGATIVE  NEGATIVE Final     Medical History: Past Medical History  Diagnosis Date  . GERD (gastroesophageal reflux disease)   . Hypertension   . Chronic lower back pain   . Post-traumatic osteoarthritis of right knee 1989    "tore ACL; failed reconstruction" (03/19/2012)  . OSA on CPAP 2005    CPAP-rarely uses  . De Quervain's disease (tenosynovitis)     "left hand" (03/19/2012)  . 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 calcium carbonate (OS-CAL) 600 MG TABS Take 1,200 mg by  mouth daily.      . lansoprazole (PREVACID) 15 MG capsule Take 15 mg by mouth daily.      Marland Kitchen lisinopril-hydrochlorothiazide (PRINZIDE,ZESTORETIC) 10-12.5 MG per tablet Take 1 tablet by mouth daily.      . Multiple Vitamin (MULTIVITAMIN WITH MINERALS) TABS Take 1 tablet by mouth daily.      . promethazine (PHENERGAN) 25 MG tablet Take 1 tablet (25 mg total) by mouth every 6 (six) hours as needed for nausea.  30 tablet  0  . triamcinolone cream (KENALOG) 0.1 % Apply 1 application topically 2 (two) times daily as needed. For eczema      . [DISCONTINUED] methocarbamol (ROBAXIN) 500 MG tablet Take 1 tablet (500 mg total) by mouth 4 (four) times daily.  75 tablet  1  . [DISCONTINUED] oxyCODONE-acetaminophen (PERCOCET) 10-325 MG per tablet Take 1-2 tablets by mouth every 6 (six) hours as needed for pain. MAXIMUM TOTAL ACETAMINOPHEN DOSE IS 4000 MG PER DAY  75 tablet  0  . [DISCONTINUED] warfarin (COUMADIN) 5 MG tablet Take 1 tablet (5 mg total) by mouth daily.  30 tablet  1   Assessment:  52 yo F s/p R TKA 03/19/12. By 04/05/12 she had developed low grade fevers and had noticed seeping from her wound as well as a foul odor. By 04/16/12 she was unable to bear weight on her R knee. Outpt orthopedic MD aspirated knee (  cx negative). Pt admitted 04/19/2012 for I&D and part exchange.   Pt received Vancomycin 1500mg  IV x 1 in OR (1/3 at 1424).  Pt also on Rocephin and Rifampin per ID recommendations.  Goal of Therapy:  Vancomycin trough level 15-20 mcg/ml  Plan:  Vancomycin 1000 mg IV x 1 now (to complete 2500 mg loading dose), followed by 1000 mg IV Q12 hours. Will follow culture data, renal function, and clinical progress. Will check Vancomycin trough level as indicated.  [employer], Pharm.D., BCPS Clinical Pharmacist Pager [phone] 04/19/2012 6:29 PM

## 2012-04-19 NOTE — Anesthesia Procedure Notes (Addendum)
Anesthesia Regional Block:  Femoral nerve block  Pre-Anesthetic Checklist: ,, timeout performed, Correct Patient, Correct Site, Correct Laterality, Correct Procedure, Correct Position, site marked, Risks and benefits discussed, at surgeon's request and post-op pain management  Laterality: Right and Upper  Prep: chloraprep       Needles:  Injection technique: Single-shot  Needle Type: Echogenic Needle      Needle Gauge: 22 and 22 G  Needle insertion depth: 6 cm   Additional Needles:  Procedures: ultrasound guided (picture in chart) and nerve stimulator Femoral nerve block  Nerve Stimulator or Paresthesia:  Response: Twitch elicited, 0.8 mA,   Additional Responses:   Narrative:  Start time: 04/19/2012 11:30 AM End time: 04/19/2012 11:54 AM Injection made incrementally with aspirations every 5 mL.  Performed by: Personally  Anesthesiologist: Alma Friendly, MD  Additional Notes: BP cuff, EKG monitors applied. Sedation begun. Femoral artery palpated for location of nerve. After nerve location anesthetic injected incrementally, slowly , and after neg aspirations. Tolerated well. [place] used  Femoral nerve block Procedure Name: Intubation Date/Time: 04/19/2012 1:46 PM Performed by: Angelica Pou Pre-anesthesia Checklist: Patient identified, Timeout performed, Emergency Drugs available, Suction available and Patient being monitored Patient Re-evaluated:Patient Re-evaluated prior to inductionOxygen Delivery Method: Circle system utilized Preoxygenation: Pre-oxygenation with 100% oxygen Intubation Type: IV induction Ventilation: Mask ventilation without difficulty and Oral airway inserted - appropriate to patient size Laryngoscope Size: Mac and 3 Grade View: Grade I Tube type: Oral Tube size: 7.0 mm Number of attempts: 1 Airway Equipment and Method: Stylet and Oral airway Placement Confirmation: ETT inserted through vocal cords under direct vision,  breath sounds  checked- equal and bilateral and positive ETCO2 Secured at: 22 cm Tube secured with: Tape Dental Injury: Teeth and Oropharynx as per pre-operative assessment

## 2012-04-20 LAB — COMPREHENSIVE METABOLIC PANEL
ALT: 16 U/L (ref 0–35)
Alkaline Phosphatase: 94 U/L (ref 39–117)
BUN: 4 mg/dL — ABNORMAL LOW (ref 6–23)
CO2: 27 mEq/L (ref 19–32)
Chloride: 97 mEq/L (ref 96–112)
GFR calc Af Amer: >90 mL/min (ref >90)
GFR calc non Af Amer: >90 mL/min (ref >90)
Glucose, Bld: 141 mg/dL — ABNORMAL HIGH (ref 70–99)
Potassium: 4 mEq/L (ref 3.5–5.1)
Sodium: 137 mEq/L (ref 135–145)
Total Bilirubin: 0.7 mg/dL (ref 0.3–1.2)

## 2012-04-20 LAB — CBC WITH DIFFERENTIAL/PLATELET
Basophils Absolute: 0 10*3/uL (ref 0.0–0.1)
Basophils Relative: 0 % (ref 0–1)
Lymphocytes Relative: 22 % (ref 12–46)
MCHC: 31.5 g/dL (ref 30.0–36.0)
Monocytes Absolute: 0.5 10*3/uL (ref 0.1–1.0)
Neutro Abs: 5.4 10*3/uL (ref 1.7–7.7)
Platelets: 260 10*3/uL (ref 150–400)
RDW: 15.6 % — ABNORMAL HIGH (ref 11.5–15.5)
WBC: 7.6 10*3/uL (ref 4.0–10.5)

## 2012-04-20 MED ORDER — BISACODYL 5 MG PO TBEC
10.0000 mg | DELAYED_RELEASE_TABLET | Freq: Every day | ORAL | Status: DC
  Administered 2012-04-20 – 2012-04-21 (×2): 10 mg via ORAL
  Filled 2012-04-20 (×2): qty 2

## 2012-04-20 MED ORDER — OXYCODONE HCL ER 10 MG PO T12A
20.0000 mg | EXTENDED_RELEASE_TABLET | Freq: Two times a day (BID) | ORAL | Status: DC
  Administered 2012-04-20 (×2): 20 mg via ORAL
  Filled 2012-04-20 (×2): qty 2

## 2012-04-20 NOTE — Progress Notes (Addendum)
Subjective: 1 Day Post-Op Procedure(s) (LRB): IRRIGATION AND DEBRIDEMENT KNEE WITH POLY EXCHANGE (Right) Patient reports pain as 5 on 0-10 scale.    Objective: Vital signs in last 24 hours: Temp:  [98 F (36.7 C)-99.2 F (37.3 C)] 98.4 F (36.9 C) (01/04 0619) Pulse Rate:  [79-97] 79  (01/04 0619) Resp:  [12-20] 16  (01/04 0619) BP: (87-147)/(55-83) 105/55 mmHg (01/04 0619) SpO2:  [93 %-100 %] 99 % (01/04 0619) Weight:  [118 kg (260 lb 2.3 oz)] 118 kg (260 lb 2.3 oz) (01/03 1800)  Intake/Output from previous day: 01/03 0701 - 01/04 0700 In: 1200 [I.V.:1200] Out: 75 [Drains:50; Blood:25] Intake/Output this shift:     Basename 04/19/12 1842 04/19/12 0957  HGB 9.6* 10.9*    Basename 04/19/12 1842 04/19/12 0957  WBC 8.6 10.6*  RBC 3.69* 4.21  HCT 29.9* 34.0*  PLT 216 270    Basename 04/19/12 1842 04/19/12 0957  NA -- 137  K -- 3.7  CL -- 95*  CO2 -- 27  BUN -- 7  CREATININE 0.58 0.62  GLUCOSE -- 123*  CALCIUM -- 9.6    Basename 04/19/12 0957  LABPT --  INR 1.99*    Neurologically intact ABD soft Neurovascular intact Sensation intact distally Intact pulses distally Dorsiflexion/Plantar flexion intact Incision: dressing C/D/I  Assessment/Plan: 1 Day Post-Op Procedure(s) (LRB): IRRIGATION AND DEBRIDEMENT KNEE WITH POLY EXCHANGE (Right) Advance diet Up with therapy Continue ABX therapy due to Post-op infection Plan for discharge tomorrow Discharge home with home health This patient is weight bearing as tolerated.  Ambulate with the knee immobilizer until post op day 2.  She will need a yellow block under her heel.  Planning on discharging tomorrow afternoon.  Patient had advanced home care.  Please restart home care with nursing, IV antibiotics and physical therapy.   Jacobb Alen J 04/20/2012, 7:18 AM

## 2012-04-20 NOTE — Progress Notes (Signed)
04/21/2011 1620 Notified [facility] of d/c home tom, they will need hard Rx for IV abx that pt will d/c home with, will need to know how long pt will be on abx and labs needed. And what MD does the agency need to report labs. Unit RN will fax to [facility] at [phone] in am. NCM will call [facility] and and will fax over d/c summary to [facility].  Faxed [facility] orders and facesheet to [facility].  Isidoro Donning RN CCM [phone] weekend.

## 2012-04-20 NOTE — Progress Notes (Signed)
Orthopedic Tech Progress Note Patient Details:  [name] 05-16-1960 [id number] Patient has already been supplied with knee immobilizer. Patient ID: Maria Holt, female   DOB: [birth year], 52 y.o.   MRN: [id number]   Orie Rout 04/20/2012, 8:16 AM

## 2012-04-20 NOTE — Progress Notes (Signed)
Physical Therapy Evaluation Patient Details Name: Maria Holt MRN: [id number] DOB: 1961-04-04 Today's Date: 04/20/2012 Time: 4098-1191 PT Time Calculation (min): 46 min  PT Assessment / Plan / Recommendation Clinical Impression  Pt is 52 yo female s/p TKA infection who had an I & D yesterday. She is mobilizing well today, ambulated 250' with RW and min-guard A. Will plan to see her again tomorrow and practice stairs and will likely be ready for d/c. Recommend f/u with outpt therapy when appropriate.    PT Assessment  Patient needs continued PT services    Follow Up Recommendations  Outpatient PT    Does the patient have the potential to tolerate intense rehabilitation      Barriers to Discharge None      Equipment Recommendations  None recommended by PT    Recommendations for Other Services     Frequency Min 3X/week    Precautions / Restrictions Precautions Precautions: Knee Required Braces or Orthoses: Knee Immobilizer - Right Knee Immobilizer - Right: Discontinue post op day 2 Restrictions Weight Bearing Restrictions: Yes RLE Weight Bearing: Weight bearing as tolerated   Pertinent Vitals/Pain 3/10 right knee pain, premedicated VSS      Mobility  Bed Mobility Bed Mobility: Not assessed (pt up in chair) Transfers Transfers: Sit to Stand;Stand to Sit Sit to Stand: 4: Min guard;From chair/3-in-1;From toilet;With upper extremity assist Stand to Sit: 4: Min guard;To toilet;To chair/3-in-1;With upper extremity assist Details for Transfer Assistance: pt stands safely without physical assist from multi-level surfaces Ambulation/Gait Ambulation/Gait Assistance: 4: Min guard Ambulation Distance (Feet): 250 Feet Assistive device: Rolling walker Ambulation/Gait Assistance Details: pt vaults over RLE due to [facility] but ambulates safely with RW. Quad activation in standing sufficient to prevent knee from buckling within KI Gait Pattern: Step-through pattern;Decreased stride  length;Right hip hike Gait velocity: decreased Stairs: No Wheelchair Mobility Wheelchair Mobility: No    Shoulder Instructions     Exercises Total Joint Exercises Ankle Circles/Pumps: AROM;Both;10 reps;Seated Quad Sets: AROM;Both;10 reps;Seated Straight Leg Raises: AAROM;5 reps;Right;Seated   PT Diagnosis: Difficulty walking;Abnormality of gait;Acute pain  PT Problem List: Decreased strength;Decreased range of motion;Pain PT Treatment Interventions: DME instruction;Gait training;Stair training;Functional mobility training;Therapeutic activities;Therapeutic exercise;Balance training;Patient/family education   PT Goals Acute Rehab PT Goals PT Goal Formulation: With patient Time For Goal Achievement: 04/27/12 Potential to Achieve Goals: Good Pt will go Supine/Side to Sit: Independently PT Goal: Supine/Side to Sit - Progress: Goal set today Pt will go Sit to Supine/Side: Independently PT Goal: Sit to Supine/Side - Progress: Goal set today Pt will go Sit to Stand: with modified independence PT Goal: Sit to Stand - Progress: Goal set today Pt will go Stand to Sit: with modified independence PT Goal: Stand to Sit - Progress: Goal set today Pt will Ambulate: >150 feet;with modified independence;with rolling walker PT Goal: Ambulate - Progress: Goal set today Pt will Go Up / Down Stairs: 1-2 stairs;with modified independence;with least restrictive assistive device PT Goal: Up/Down Stairs - Progress: Goal set today Pt will Perform Home Exercise Program: Independently PT Goal: Perform Home Exercise Program - Progress: Goal set today  Visit Information  Last PT Received On: 04/20/12 Assistance Needed: +1    Subjective Data  Subjective: I couldn't even walk the day before I came back in here Patient Stated Goal: return home   Prior Functioning  Home Living Lives With: Family Available Help at Discharge: Family;Available 24 hours/day Type of Home: House Home Access: Stairs to  enter [employer] of Steps: 2  Home Layout: One level Home Adaptive Equipment: Walker - rolling;Crutches Additional Comments: pt had been finishing up with outpatient therapy when knee pain increased Prior Function Level of Independence: Independent with assistive device(s) Able to Take Stairs?: Yes Driving: Yes Vocation: Full time employment Comments: 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 Communication Communication: No difficulties    Cognition  Overall Cognitive Status: Appears within functional limits for tasks assessed/performed Arousal/Alertness: Awake/alert Orientation Level: Oriented X4 / Intact Behavior During Session: [facility] for tasks performed    Extremity/Trunk Assessment Right Upper Extremity Assessment RUE ROM/Strength/Tone: [facility] for tasks assessed Left Upper Extremity Assessment LUE ROM/Strength/Tone: WFL for tasks assessed Right Lower Extremity Assessment RLE ROM/Strength/Tone: Deficits RLE ROM/Strength/Tone Deficits: quad 2-/5, hip flex 2-/5 RLE Sensation: WFL - Light Touch RLE Coordination: WFL - gross motor Left Lower Extremity Assessment LLE ROM/Strength/Tone: Within functional levels LLE Sensation: WFL - Light Touch;WFL - Proprioception LLE Coordination: WFL - gross motor Trunk Assessment Trunk Assessment: Normal   Balance Balance Balance Assessed: Yes Dynamic Standing Balance Dynamic Standing - Balance Support: No upper extremity supported;During functional activity Dynamic Standing - Level of Assistance: 5: Stand by assistance  End of Session PT - End of Session Equipment Utilized During Treatment: Gait belt;Right knee immobilizer Activity Tolerance: Patient tolerated treatment well Patient left: in chair;with call bell/phone within reach;with family/visitor present Nurse Communication: Mobility status  GP   Lyanne Co, PT  Acute Rehab Services  [phone]   Lyanne Co 04/20/2012, 2:32 PM

## 2012-04-20 NOTE — Progress Notes (Signed)
Orthopedic Tech Progress Note Patient Details:  Maria Holt 01/01/1961 [id number] Ortho Tech called to supply wedge for patient. Bed wedge and foam heel wedge block taken to unit to see which one nursing wanted. Spoke with patient's attending nurse and nurse stated that neither wedge was what she was looking for. The wedge she wanted was smaller and usually supplied to patient's after knee replacement or related surgeries. Nurse was informed that [facility] does not carry that particular wedge and she may need to call to [facility] to see if they have it.  Patient ID: Maria Holt, female   DOB: 04/05/1961, 52 y.o.   MRN: [id number]   Orie Rout 04/20/2012, 8:23 AM

## 2012-04-20 NOTE — Progress Notes (Signed)
OT Cancellation Note  Patient Details Name: Maria Holt MRN: [id number] DOB: Jun 27, 1960   Cancelled Treatment:    Reason Eval/Treat Not Completed: Other (comment) (Pt screened, no OT needs at this time.) Pt reports being familiar with OT from previous TKA.  Has all needed DME and feel comfortable with her ability to perform ADLS with her husbands assist.  No further OT needs at this time.  Tomma Ehinger OTR/L 04/20/2012, 2:08 PM

## 2012-04-21 DIAGNOSIS — D62 Acute posthemorrhagic anemia: Secondary | ICD-10-CM | POA: Diagnosis not present

## 2012-04-21 LAB — CBC
HCT: 26.4 % — ABNORMAL LOW (ref 36.0–46.0)
Hemoglobin: 8.4 g/dL — ABNORMAL LOW (ref 12.0–15.0)
MCH: 26 pg (ref 26.0–34.0)
MCHC: 31.8 g/dL (ref 30.0–36.0)
MCV: 81.7 fL (ref 78.0–100.0)
RBC: 3.23 MIL/uL — ABNORMAL LOW (ref 3.87–5.11)

## 2012-04-21 MED ORDER — OXYCODONE HCL ER 15 MG PO T12A
40.0000 mg | EXTENDED_RELEASE_TABLET | Freq: Two times a day (BID) | ORAL | Status: DC
  Administered 2012-04-21 – 2012-04-22 (×3): 40 mg via ORAL
  Filled 2012-04-21 (×2): qty 2
  Filled 2012-04-21 (×2): qty 1

## 2012-04-21 MED ORDER — WARFARIN SODIUM 5 MG PO TABS
5.0000 mg | ORAL_TABLET | Freq: Once | ORAL | Status: AC
  Administered 2012-04-21: 5 mg via ORAL
  Filled 2012-04-21: qty 1

## 2012-04-21 MED ORDER — WARFARIN - PHARMACIST DOSING INPATIENT: Freq: Every day | Status: DC

## 2012-04-21 MED ORDER — OXYCODONE HCL 5 MG PO TABS: 15.0000 mg | ORAL_TABLET | ORAL | Status: DC | PRN

## 2012-04-21 MED ORDER — BISACODYL 10 MG RE SUPP
10.0000 mg | Freq: Once | RECTAL | Status: AC
  Administered 2012-04-21: 10 mg via RECTAL
  Filled 2012-04-21: qty 1

## 2012-04-21 MED ORDER — SODIUM CHLORIDE 0.9 % IJ SOLN
10.0000 mL | INTRAMUSCULAR | Status: DC | PRN
  Administered 2012-04-22: 10 mL

## 2012-04-21 NOTE — Progress Notes (Signed)
Pharmacy CONSULT NOTE  Pharmacy Consult for Vancomycin / Coumadin Indication: septic arthritis (R knee) / VTE prophylaxis  Allergies  Allergen Reactions  . Dilaudid (Hydromorphone Hcl) Hives and Other (See Comments)    "I don't remember the reaction; just know it was a no no" (03/19/2012)  . Demerol (Meperidine) Hives  . Morphine And Related Hives  . Hibiclens (Chlorhexidine Gluconate)     CHG wipes make her itch  . Sulfa Antibiotics Other (See Comments)    "don't remember the reaction; Dr. just told me I was allergic" (03/19/2012)    Labs:  Basename 04/21/12 0535 04/20/12 0810 04/19/12 1842 04/19/12 0957  WBC 6.4 7.6 8.6 --  HGB 8.4* 9.9* 9.6* --  PLT 215 260 216 --  LABCREA -- -- -- --  CREATININE -- 0.54 0.58 0.62   Estimated Creatinine Clearance: 111.5 ml/min (by C-G formula based on Cr of 0.54).    Microbiology: Recent Results (from the past 720 hour(s))  SURGICAL PCR SCREEN     Status: Normal   Collection Time   04/19/12 10:25 AM      Component Value Range Status Comment   MRSA, PCR NEGATIVE  NEGATIVE Final    Staphylococcus aureus NEGATIVE  NEGATIVE Final   TISSUE CULTURE     Status: Normal (Preliminary result)   Collection Time   04/19/12  2:50 PM      Component Value Range Status Comment   Specimen Description TISSUE RIGHT KNEE   Final    Special Requests NONE   Final    Gram Stain     Final    Value: FEW WBC PRESENT, PREDOMINANTLY PMN     NO SQUAMOUS EPITHELIAL CELLS SEEN     NO ORGANISMS SEEN   Culture NO GROWTH 2 DAYS   Final    Report Status PENDING   Incomplete   ANAEROBIC CULTURE     Status: Normal (Preliminary result)   Collection Time   04/19/12  2:50 PM      Component Value Range Status Comment   Specimen Description TISSUE RIGHT KNEE   Final    Special Requests NONE   Final    Gram Stain PENDING   Incomplete    Culture     Final    Value: NO ANAEROBES ISOLATED; CULTURE IN PROGRESS FOR 5 DAYS   Report Status PENDING   Incomplete   WOUND CULTURE      Status: Normal (Preliminary result)   Collection Time   04/19/12  2:53 PM      Component Value Range Status Comment   Specimen Description WOUND RIGHT KNEE   Final    Special Requests NONE   Final    Gram Stain     Final    Value: FEW WBC PRESENT, PREDOMINANTLY PMN     NO SQUAMOUS EPITHELIAL CELLS SEEN     NO ORGANISMS SEEN   Culture Culture reincubated for better growth   Final    Report Status PENDING   Incomplete   ANAEROBIC CULTURE     Status: Normal (Preliminary result)   Collection Time   04/19/12  2:53 PM      Component Value Range Status Comment   Specimen Description WOUND RIGHT KNEE   Final    Special Requests NONE   Final    Gram Stain PENDING   Incomplete    Culture     Final    Value: NO ANAEROBES ISOLATED; CULTURE IN PROGRESS FOR 5 DAYS   Report Status  PENDING   Incomplete   BODY FLUID CULTURE     Status: Normal (Preliminary result)   Collection Time   04/19/12  2:55 PM      Component Value Range Status Comment   Specimen Description FLUID SYNOVIAL RIGHT KNEE   Final    Special Requests NONE   Final    Gram Stain     Final    Value: ABUNDANT WBC PRESENT, PREDOMINANTLY PMN     NO ORGANISMS SEEN   Culture NO GROWTH 1 DAY   Final    Report Status PENDING   Incomplete   ANAEROBIC CULTURE     Status: Normal (Preliminary result)   Collection Time   04/19/12  2:55 PM      Component Value Range Status Comment   Specimen Description FLUID SYNOVIAL RIGHT KNEE   Final    Special Requests NONE   Final    Gram Stain PENDING   Incomplete    Culture     Final    Value: NO ANAEROBES ISOLATED; CULTURE IN PROGRESS FOR 5 DAYS   Report Status PENDING   Incomplete     Medical History: Past Medical History  Diagnosis Date  . GERD (gastroesophageal reflux disease)   . Hypertension   . Chronic lower back pain   . Post-traumatic osteoarthritis of right knee 1989    "tore ACL; failed reconstruction" (03/19/2012)  . OSA on CPAP 2005    CPAP-rarely uses  . De Quervain's disease  (tenosynovitis)     "left hand" (03/19/2012)  . 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 By 04/05/12 she had developed low grade fevers and had noticed seeping from her wound as well as a foul odor. By 04/16/12 she was unable to bear weight on her R knee. Outpt orthopedic MD aspirated knee (cx negative). Pt admitted 04/19/2012 for I&D and part exchange.   Pt received Vancomycin 1500mg  IV x 1 in OR (1/3 at 1424).  Pt also on Rocephin and Rifampin per ID recommendations.  On Coumadin PTA for VTE prophylaxis at a dose of 5 mg daily - to resume today.  INR = 1.63  Goal of Therapy:  Vancomycin trough level 15-20 mcg/ml  Plan: 1) Continue Vancomycin 1 Gram IV Q 12 hours 2) Check vancomycin trough in AM  3) Coumadin 5 mg po x 1 dose today  4) Follow up AM  Thank you. Okey Regal, PharmD [phone]  04/21/2012 11:02 AM

## 2012-04-21 NOTE — Progress Notes (Signed)
Subjective: 2 Days Post-Op Procedure(s) (LRB): IRRIGATION AND DEBRIDEMENT KNEE WITH POLY EXCHANGE (Right) Patient reports pain as 8 on 0-10 scale.    Objective: Vital signs in last 24 hours: Temp:  [97.6 F (36.4 C)-98.5 F (36.9 C)] 97.6 F (36.4 C) (01/05 0705) Pulse Rate:  [87-99] 95  (01/05 0705) Resp:  [16-20] 20  (01/05 0705) BP: (109-129)/(55-76) 129/76 mmHg (01/05 0705) SpO2:  [98 %-100 %] 100 % (01/05 0705)  Intake/Output from previous day: 01/04 0701 - 01/05 0700 In: 2561.3 [P.O.:200; I.V.:2261.3; IV Piggyback:100] Out: 25 [Drains:25] Intake/Output this shift:     Basename 04/21/12 0535 04/20/12 0810 04/19/12 1842 04/19/12 0957  HGB 8.4* 9.9* 9.6* 10.9*    Basename 04/21/12 0535 04/20/12 0810  WBC 6.4 7.6  RBC 3.23* 3.82*  HCT 26.4* 31.4*  PLT 215 260    Basename 04/20/12 0810 04/19/12 1842 04/19/12 0957  NA 137 -- 137  K 4.0 -- 3.7  CL 97 -- 95*  CO2 27 -- 27  BUN 4* -- 7  CREATININE 0.54 0.58 --  GLUCOSE 141* -- 123*  CALCIUM 9.0 -- 9.6    Basename 04/21/12 0535 04/19/12 0957  LABPT -- --  INR 1.63* 1.99*    Neurologically intact ABD soft Neurovascular intact Sensation intact distally Intact pulses distally Dorsiflexion/Plantar flexion intact Incision: moderate drainage No cellulitis present  Assessment/Plan: 2 Days Post-Op Procedure(s) (LRB): IRRIGATION AND DEBRIDEMENT KNEE WITH POLY EXCHANGE (Right) Principal Problem:  *Infection of total right knee replacement Active Problems:  Post-traumatic osteoarthritis of left knee  BMI 40.0-44.9, adult  Difficulty in walking  Stiffness of joint, not elsewhere classified, lower leg  Weakness of right leg  Postoperative anemia due to acute blood loss  Advance diet Up with therapy Continue ABX therapy due to Post-op infection Plan for discharge tomorrow Discharge home with home health  Patient still having difficulty with constipation.  Will give a suppository today    Will also increase  OXYIR to 15mg  tablets 1-2 tab q 3hrs prn pain.  Difficulty sleeping  Maria Holt J 04/21/2012, 9:52 AM

## 2012-04-21 NOTE — Progress Notes (Signed)
04/22/2011 1800 NCM follow up for Rx for IV abx. Waiting final orders for d/c IV abx for home. Pt scheduled d/c home on tomorrow. Weekday NCM will follow up with [facility] on 04/22/2012. Isidoro Donning RN CCM Case Mgmt phone [phone]  04/21/2012 1100  Received message from [facility] pharmacist, requesting info on home IV abx. NCM notified [facility] that pt was not dc home today and weekday NCM will follow up on 1/6.  Isidoro Donning RN CCM Case Mgmt phone [phone]

## 2012-04-21 NOTE — Progress Notes (Signed)
Physical Therapy Treatment Patient Details Name: Maria Holt MRN: [id number] DOB: December 26, 1960 Today's Date: 04/21/2012 Time: 4098-1191 PT Time Calculation (min): 25 min  PT Assessment / Plan / Recommendation Comments on Treatment Session  Pt making great progress. Reports walking with spouse in hall. Observed most recent walk with spouse just before PT session: pt demo's slight antalgic gait pattern with step through pattern. Pt declined stairs today due to just walking same distance as yesterday with PT (250 feet) and tired. Did agree to ther ex for right knee strengthening and motion.        Follow Up Recommendations  Outpatient PT           Equipment Recommendations  None recommended by PT       Frequency Min 3X/week   Plan Discharge plan remains appropriate;Frequency remains appropriate    Precautions / Restrictions Precautions Precautions: Knee Required Braces or Orthoses: Knee Immobilizer - Right Knee Immobilizer - Right: Discontinue post op day 2 Restrictions RLE Weight Bearing: Weight bearing as tolerated    Pertinent Vitals/Pain Rated her pain as 6/10 after exercises. Was premedicated.    Mobility  Bed Mobility Bed Mobility: Supine to Sit;Sit to Supine Supine to Sit: 5: Supervision;HOB flat Sit to Supine: 5: Supervision;HOB flat Details for Bed Mobility Assistance: pt uses her left leg to move her right leg as needed in/out of bed. no cues or assistance needed. Transfers Sit to Stand: 6: Modified independent (Device/Increase time);From bed;With upper extremity assist Stand to Sit: 6: Modified independent (Device/Increase time);To bed;With upper extremity assist Details for Transfer Assistance: pt had just returned from gait in hall with spouse upon arrival to room, observed pt get in bed for exercises and out of bed afterwards for bathroom trip    Exercises Total Joint Exercises Ankle Circles/Pumps: AROM;Both;10 reps;Supine Quad Sets: AROM;Strengthening;Right;10  reps;Supine (with 5 second hold) Short Arc Quad: AAROM;Strengthening;Right;10 reps;Supine Heel Slides: AAROM;Strengthening;Right;10 reps;Supine Hip ABduction/ADduction: AAROM;Strengthening;Right;10 reps;Supine Straight Leg Raises: AAROM;Strengthening;Right;10 reps;Supine Goniometric ROM: supine AAROM knee flexion 65 degrees,     PT Goals Acute Rehab PT Goals PT Goal: Supine/Side to Sit - Progress: Progressing toward goal PT Goal: Sit to Supine/Side - Progress: Progressing toward goal PT Goal: Sit to Stand - Progress: Progressing toward goal PT Goal: Stand to Sit - Progress: Progressing toward goal PT Goal: Perform Home Exercise Program - Progress: Progressing toward goal  Visit Information  Last PT Received On: 04/21/12 Assistance Needed: +1    Subjective Data  Subjective: Agreeable to therapy for knee strength and motion, just walked in hall with spouse,. Reports her knee pain in better today.   Cognition  Overall Cognitive Status: Appears within functional limits for tasks assessed/performed Arousal/Alertness: Awake/alert Orientation Level: Appears intact for tasks assessed Behavior During Session: [facility] for tasks performed       End of Session PT - End of Session Equipment Utilized During Treatment: Gait belt;Right knee immobilizer Activity Tolerance: Patient tolerated treatment well Patient left: with call bell/phone within reach;with family/visitor present;Other (comment) (pt going to bathroom with spouse assist)   GP     Sallyanne Kuster 04/21/2012, 12:37 PM  Sallyanne Kuster, PTA Office- [phone]

## 2012-04-22 ENCOUNTER — Encounter (HOSPITAL_COMMUNITY): Payer: Self-pay | Admitting: Orthopedic Surgery

## 2012-04-22 DIAGNOSIS — Y838 Other surgical procedures as the cause of abnormal reaction of the patient, or of later complication, without mention of misadventure at the time of the procedure: Secondary | ICD-10-CM

## 2012-04-22 DIAGNOSIS — T8450XA Infection and inflammatory reaction due to unspecified internal joint prosthesis, initial encounter: Principal | ICD-10-CM

## 2012-04-22 DIAGNOSIS — Z96659 Presence of unspecified artificial knee joint: Secondary | ICD-10-CM

## 2012-04-22 LAB — PROTIME-INR: INR: 1.44 (ref 0.00–1.49)

## 2012-04-22 LAB — BODY FLUID CULTURE: Culture: NO GROWTH

## 2012-04-22 LAB — TISSUE CULTURE: Culture: NO GROWTH

## 2012-04-22 LAB — WOUND CULTURE

## 2012-04-22 LAB — VANCOMYCIN, TROUGH: Vancomycin Tr: 6.4 ug/mL — ABNORMAL LOW (ref 10.0–20.0)

## 2012-04-22 LAB — CBC
HCT: 26.9 % — ABNORMAL LOW (ref 36.0–46.0)
Hemoglobin: 8.4 g/dL — ABNORMAL LOW (ref 12.0–15.0)
WBC: 6.3 10*3/uL (ref 4.0–10.5)

## 2012-04-22 MED ORDER — HEPARIN SOD (PORK) LOCK FLUSH 100 UNIT/ML IV SOLN
250.0000 [IU] | INTRAVENOUS | Status: AC | PRN
  Administered 2012-04-22: 16:00:00

## 2012-04-22 MED ORDER — HEPARIN SOD (PORK) LOCK FLUSH 100 UNIT/ML IV SOLN
250.0000 [IU] | INTRAVENOUS | Status: AC | PRN
  Administered 2012-04-22: 500 [IU]

## 2012-04-22 MED ORDER — VANCOMYCIN HCL 10 G IV SOLR
1500.0000 mg | Freq: Three times a day (TID) | INTRAVENOUS | Status: DC
  Administered 2012-04-22: 1500 mg via INTRAVENOUS
  Filled 2012-04-22 (×3): qty 1500

## 2012-04-22 NOTE — Progress Notes (Signed)
INFECTIOUS DISEASE PROGRESS NOTE  ID: Maria Holt is a 52 y.o. female with   Principal Problem:  *Infection of total right knee replacement Active Problems:  Post-traumatic osteoarthritis of left knee  BMI 40.0-44.9, adult  Difficulty in walking  Stiffness of joint, not elsewhere classified, lower leg  Weakness of right leg  Postoperative anemia due to acute blood loss  Subjective: without complaints.   Abtx:  Anti-infectives     Start     Dose/Rate Route Frequency Ordered Stop   04/22/12 1200   vancomycin (VANCOCIN) 1,500 mg in sodium chloride 0.9 % 500 mL IVPB        1,500 mg 250 mL/hr over 120 Minutes Intravenous Every 8 hours 04/22/12 0608     04/20/12 0600   vancomycin (VANCOCIN) IVPB 1000 mg/200 mL premix  Status:  Discontinued        1,000 mg 200 mL/hr over 60 Minutes Intravenous Every 12 hours 04/19/12 1831 04/22/12 0608   04/19/12 1900   cefTRIAXone (ROCEPHIN) 1 g in dextrose 5 % 50 mL IVPB        1 g 100 mL/hr over 30 Minutes Intravenous Every 24 hours 04/19/12 1801     04/19/12 1900   rifampin (RIFADIN) capsule 300 mg        300 mg Oral Daily 04/19/12 1755     04/19/12 1845   vancomycin (VANCOCIN) IVPB 1000 mg/200 mL premix        1,000 mg 200 mL/hr over 60 Minutes Intravenous NOW 04/19/12 1831 04/20/12 1845   04/19/12 1400   vancomycin (VANCOCIN) 1,500 mg in sodium chloride 0.9 % 500 mL IVPB  Status:  Discontinued        1,500 mg 250 mL/hr over 120 Minutes Intravenous To Surgery 04/19/12 1354 04/19/12 1743          Medications:  Scheduled:   . bisacodyl  10 mg Oral QPC breakfast  . calcium carbonate  1,200 mg Oral Daily  . cefTRIAXone (ROCEPHIN)  IV  1 g Intravenous Q24H  . docusate sodium  100 mg Oral BID  . enoxaparin (LOVENOX) injection  30 mg Subcutaneous Q12H  . lisinopril  10 mg Oral Daily   And  . hydrochlorothiazide  12.5 mg Oral Daily  . methocarbamol  500 mg Oral QID  . multivitamin with minerals  1 tablet Oral Daily  . OxyCODONE   40 mg Oral Q12H  . pantoprazole  40 mg Oral Daily  . rifampin  300 mg Oral Daily  . senna  1 tablet Oral BID  . vancomycin  1,500 mg Intravenous Q8H  . Warfarin - Pharmacist Dosing Inpatient   Does not apply q1800    Objective: Vital signs in last 24 hours: Temp:  [98.7 F (37.1 C)-99.2 F (37.3 C)] 98.7 F (37.1 C) (01/06 0549) Pulse Rate:  [91-100] 94  (01/06 0549) Resp:  [16-20] 16  (01/06 0549) BP: (126-135)/(63-78) 126/63 mmHg (01/06 0549) SpO2:  [95 %-100 %] 95 % (01/06 0549)   General appearance: alert, cooperative and no distress Extremities: RUE PIC, RLE wrapped.   Lab Results  Basename 04/22/12 0458 04/21/12 0535 04/20/12 0810 04/19/12 1842  WBC 6.3 6.4 -- --  HGB 8.4* 8.4* -- --  HCT 26.9* 26.4* -- --  NA -- -- 137 --  K -- -- 4.0 --  CL -- -- 97 --  CO2 -- -- 27 --  BUN -- -- 4* --  CREATININE -- -- 0.54 0.58  GLU -- -- -- --  Liver Panel  Basename 04/20/12 0810  PROT 6.9  ALBUMIN 2.8*  AST 16  ALT 16  ALKPHOS 94  BILITOT 0.7  BILIDIR --  IBILI --   Sedimentation Rate  Basename 04/20/12 0810  ESRSEDRATE 73*   C-Reactive Protein  Basename 04/20/12 0810  CRP 22.9*    Microbiology: Recent Results (from the past 240 hour(s))  SURGICAL PCR SCREEN     Status: Normal   Collection Time   04/19/12 10:25 AM      Component Value Range Status Comment   MRSA, PCR NEGATIVE  NEGATIVE Final    Staphylococcus aureus NEGATIVE  NEGATIVE Final   TISSUE CULTURE     Status: Normal   Collection Time   04/19/12  2:50 PM      Component Value Range Status Comment   Specimen Description TISSUE RIGHT KNEE   Final    Special Requests NONE   Final    Gram Stain     Final    Value: FEW WBC PRESENT, PREDOMINANTLY PMN     NO SQUAMOUS EPITHELIAL CELLS SEEN     NO ORGANISMS SEEN   Culture NO GROWTH 3 DAYS   Final    Report Status 04/22/2012 FINAL   Final   ANAEROBIC CULTURE     Status: Normal (Preliminary result)   Collection Time   04/19/12  2:50 PM       Component Value Range Status Comment   Specimen Description TISSUE RIGHT KNEE   Final    Special Requests NONE   Final    Gram Stain PENDING   Incomplete    Culture     Final    Value: NO ANAEROBES ISOLATED; CULTURE IN PROGRESS FOR 5 DAYS   Report Status PENDING   Incomplete   WOUND CULTURE     Status: Normal   Collection Time   04/19/12  2:53 PM      Component Value Range Status Comment   Specimen Description WOUND RIGHT KNEE   Final    Special Requests NONE   Final    Gram Stain     Final    Value: FEW WBC PRESENT, PREDOMINANTLY PMN     NO SQUAMOUS EPITHELIAL CELLS SEEN     NO ORGANISMS SEEN   Culture FEW STAPHYLOCOCCUS SPECIES (COAGULASE NEGATIVE)   Final    Report Status 04/22/2012 FINAL   Final   ANAEROBIC CULTURE     Status: Normal (Preliminary result)   Collection Time   04/19/12  2:53 PM      Component Value Range Status Comment   Specimen Description WOUND RIGHT KNEE   Final    Special Requests NONE   Final    Gram Stain PENDING   Incomplete    Culture     Final    Value: NO ANAEROBES ISOLATED; CULTURE IN PROGRESS FOR 5 DAYS   Report Status PENDING   Incomplete   BODY FLUID CULTURE     Status: Normal   Collection Time   04/19/12  2:55 PM      Component Value Range Status Comment   Specimen Description FLUID SYNOVIAL RIGHT KNEE   Final    Special Requests NONE   Final    Gram Stain     Final    Value: ABUNDANT WBC PRESENT, PREDOMINANTLY PMN     NO ORGANISMS SEEN   Culture NO GROWTH 3 DAYS   Final    Report Status 04/22/2012 FINAL   Final   ANAEROBIC CULTURE  Status: Normal (Preliminary result)   Collection Time   04/19/12  2:55 PM      Component Value Range Status Comment   Specimen Description FLUID SYNOVIAL RIGHT KNEE   Final    Special Requests NONE   Final    Gram Stain PENDING   Incomplete    Culture     Final    Value: NO ANAEROBES ISOLATED; CULTURE IN PROGRESS FOR 5 DAYS   Report Status PENDING   Incomplete     Studies/Results: No results  found.   Assessment/Plan: Septic Arthritis/Wound Infection/Osteomyelitis Would- Plan for 6 weeks total of vanco/ceftriaxone/rifampin.  Will see her back in ID clinic in 4-5 weeks.  Weekly CMP, CBC, ESR, CRP Would suggest that the coag negative staph is a contaminant since not seen in other Cx's. Discussed with Dr Dion Saucier, pt and spouse, home health.  Total days of antibiotics 4 (vanco/ceftriaxone/rifampin)         Johny Sax Infectious Diseases [phone] 04/22/2012, 2:38 PM   LOS: 3 days

## 2012-04-22 NOTE — Discharge Summary (Signed)
Physician Discharge Summary  Patient ID: Maria Holt MRN: [id number] DOB/AGE: 12-29-1960 52 y.o.  Admit date: 04/19/2012 Discharge date: 04/22/2012  Admission Diagnoses:  Infection of total right knee replacement  Discharge Diagnoses:  Principal Problem:  *Infection of total right knee replacement Active Problems:  Post-traumatic osteoarthritis of left knee  BMI 40.0-44.9, adult  Difficulty in walking  Stiffness of joint, not elsewhere classified, lower leg  Weakness of right leg  Postoperative anemia due to acute blood loss   Past Medical History  Diagnosis Date  . GERD (gastroesophageal reflux disease)   . Hypertension   . Chronic lower back pain   . Post-traumatic osteoarthritis of right knee 1989    "tore ACL; failed reconstruction" (03/19/2012)  . OSA on CPAP 2005    CPAP-rarely uses  . De Quervain's disease (tenosynovitis)     "left hand" (03/19/2012)  . Infection of total right knee replacement 04/19/2012    Surgeries: Procedure(s): IRRIGATION AND DEBRIDEMENT KNEE WITH POLY EXCHANGE on 04/19/2012   Consultants (if any): Johny Sax, infectious disease  Discharged Condition: Improved  Hospital Course: Maria Holt is an 52 y.o. female who was admitted 04/19/2012 with a diagnosis of Infection of total right knee replacement and went to the operating room on 04/19/2012 and underwent the above named procedures.    She was given perioperative antibiotics:  Anti-infectives     Start     Dose/Rate Route Frequency Ordered Stop   04/22/12 1200   vancomycin (VANCOCIN) 1,500 mg in sodium chloride 0.9 % 500 mL IVPB        1,500 mg 250 mL/hr over 120 Minutes Intravenous Every 8 hours 04/22/12 0608     04/20/12 0600   vancomycin (VANCOCIN) IVPB 1000 mg/200 mL premix  Status:  Discontinued        1,000 mg 200 mL/hr over 60 Minutes Intravenous Every 12 hours 04/19/12 1831 04/22/12 0608   04/19/12 1900   cefTRIAXone (ROCEPHIN) 1 g in dextrose 5 % 50 mL IVPB        1 g 100  mL/hr over 30 Minutes Intravenous Every 24 hours 04/19/12 1801     04/19/12 1900   rifampin (RIFADIN) capsule 300 mg        300 mg Oral Daily 04/19/12 1755     04/19/12 1845   vancomycin (VANCOCIN) IVPB 1000 mg/200 mL premix        1,000 mg 200 mL/hr over 60 Minutes Intravenous NOW 04/19/12 1831 04/20/12 1845   04/19/12 1400   vancomycin (VANCOCIN) 1,500 mg in sodium chloride 0.9 % 500 mL IVPB  Status:  Discontinued        1,500 mg 250 mL/hr over 120 Minutes Intravenous To Surgery 04/19/12 1354 04/19/12 1743        .  She was given sequential compression devices, early ambulation, and Lovenox for DVT prophylaxis. We will plan for 3 weeks and Lovenox postoperatively.  Her operative cultures, as well as the cultures that were taken in the office have continued to be negative, and we have never had a definitive culture that diagnosed infection, however her laboratory values were concerning with elevated CRP and sedimentation rate, and she did have a fair amount of fluid inside of her knee, and so we have been aggressive in the surgical management in order to minimize the risk for long-term/chronic infection. The definitive recommendations for the home IV antibiotics have not yet been made, and I will await the input from Dr. Ninetta Lights regarding this. Her  wounds were clean and dry without any significant drainage or erythema at the time of discharge.  She benefited maximally from the hospital stay and there were no complications.    Recent vital signs:  Filed Vitals:   04/22/12 0549  BP: 126/63  Pulse: 94  Temp: 98.7 F (37.1 C)  Resp: 16    Recent laboratory studies:  Lab Results  Component Value Date   HGB 8.4* 04/22/2012   HGB 8.4* 04/21/2012   HGB 9.9* 04/20/2012   Lab Results  Component Value Date   WBC 6.3 04/22/2012   PLT 213 04/22/2012   Lab Results  Component Value Date   INR 1.44 04/22/2012   Lab Results  Component Value Date   NA 137 04/20/2012   K 4.0 04/20/2012   CL 97  04/20/2012   CO2 27 04/20/2012   BUN 4* 04/20/2012   CREATININE 0.54 04/20/2012   GLUCOSE 141* 04/20/2012    Discharge Medications:     Medication List     As of 04/22/2012  8:26 AM    STOP taking these medications         warfarin 5 MG tablet   Commonly known as: COUMADIN      TAKE these medications         calcium carbonate 600 MG Tabs   Commonly known as: OS-CAL   Take 1,200 mg by mouth daily.      enoxaparin 40 MG/0.4ML injection   Commonly known as: LOVENOX   Inject 0.4 mLs (40 mg total) into the skin daily.      lansoprazole 15 MG capsule   Commonly known as: PREVACID   Take 15 mg by mouth daily.      lisinopril-hydrochlorothiazide 10-12.5 MG per tablet   Commonly known as: PRINZIDE,ZESTORETIC   Take 1 tablet by mouth daily.      methocarbamol 500 MG tablet   Commonly known as: ROBAXIN   Take 1 tablet (500 mg total) by mouth 4 (four) times daily.      multivitamin with minerals Tabs   Take 1 tablet by mouth daily.      oxyCODONE-acetaminophen 10-325 MG per tablet   Commonly known as: PERCOCET   Take 1-2 tablets by mouth every 6 (six) hours as needed for pain. MAXIMUM TOTAL ACETAMINOPHEN DOSE IS 4000 MG PER DAY      promethazine 25 MG tablet   Commonly known as: PHENERGAN   Take 1 tablet (25 mg total) by mouth every 6 (six) hours as needed for nausea.      triamcinolone cream 0.1 %   Commonly known as: KENALOG   Apply 1 application topically 2 (two) times daily as needed. For eczema        Diagnostic Studies: Ir Fluoro Guide Cv Line Right  04/18/2012  *RADIOLOGY REPORT*  Clinical Data: Postop knee replacement, infection, access for antibiotics  PICC LINE PLACEMENT WITH ULTRASOUND AND FLUOROSCOPIC  GUIDANCE  Fluoroscopy Time: 0.3 minutes.  The right arm was prepped with chlorhexidine, draped in the usual sterile fashion using maximum barrier technique (cap and mask, sterile gown, sterile gloves, large sterile sheet, hand hygiene and cutaneous antisepsis) and  infiltrated locally with 1% Lidocaine.  Ultrasound demonstrated patency of the right brachial vein, and this was documented with an image.  Under real-time ultrasound guidance, this vein was accessed with a 21 gauge micropuncture needle and image documentation was performed.  The needle was exchanged over a guidewire for a peel-away sheath through which a 5  [place] single lumen PICC trimmed to 38 cm was advanced, positioned with its tip at the lower SVC/right atrial junction.  Fluoroscopy during the procedure and fluoro spot radiograph confirms appropriate catheter position.  The catheter was flushed, secured to the skin with Prolene sutures, and covered with a sterile dressing.  Complications:  No immediate  IMPRESSION: Successful right arm PICC line placement with ultrasound and fluoroscopic guidance.  The catheter is ready for use.   Original Report Authenticated By: Judie Petit. Miles Costain, M.D.    Ir US Guide Vasc Access Right  04/18/2012  *RADIOLOGY REPORT*  Clinical Data: Postop knee replacement, infection, access for antibiotics  PICC LINE PLACEMENT WITH ULTRASOUND AND FLUOROSCOPIC  GUIDANCE  Fluoroscopy Time: 0.3 minutes.  The right arm was prepped with chlorhexidine, draped in the usual sterile fashion using maximum barrier technique (cap and mask, sterile gown, sterile gloves, large sterile sheet, hand hygiene and cutaneous antisepsis) and infiltrated locally with 1% Lidocaine.  Ultrasound demonstrated patency of the right brachial vein, and this was documented with an image.  Under real-time ultrasound guidance, this vein was accessed with a 21 gauge micropuncture needle and image documentation was performed.  The needle was exchanged over a guidewire for a peel-away sheath through which a 5 [place] single lumen PICC trimmed to 38 cm was advanced, positioned with its tip at the lower SVC/right atrial junction.  Fluoroscopy during the procedure and fluoro spot radiograph confirms appropriate catheter position.  The  catheter was flushed, secured to the skin with Prolene sutures, and covered with a sterile dressing.  Complications:  No immediate  IMPRESSION: Successful right arm PICC line placement with ultrasound and fluoroscopic guidance.  The catheter is ready for use.   Original Report Authenticated By: Judie Petit. Miles Costain, M.D.     Disposition: 06-Home-Health Care Svc      Discharge Orders    Future Orders Please Complete By Expires   Diet general      Call MD / Call 911      Comments:   If you experience chest pain or shortness of breath, CALL 911 and be transported to the hospital emergency room.  If you develope a fever above 101 F, pus (white drainage) or increased drainage or redness at the wound, or calf pain, call your surgeon's office.   Discharge instructions      Comments:   Change dressing in 3 days and reapply fresh dressing, unless you have a splint (half cast).  If you have a splint/cast, just leave in place until your follow-up appointment.    Keep wounds dry for 3 weeks.  Leave steri-strips in place on skin.  Do not apply lotion or anything to the wound.   Constipation Prevention      Comments:   Drink plenty of fluids.  Prune juice may be helpful.  You may use a stool softener, such as Colace (over the counter) 100 mg twice a day.  Use MiraLax (over the counter) for constipation as needed.      Follow-up Information    Follow up with Eulas Post, MD. Schedule an appointment as soon as possible for a visit in 2 weeks.   Contact information:   [address] [place] [address] [phone]       Follow up with [facility] . [facility] RN and Physical Therapy )    Contact information:   [phone]          Signed: Eulas Post 04/22/2012, 8:26 AM

## 2012-04-22 NOTE — Progress Notes (Signed)
Physical Therapy Treatment Patient Details Name: Maria Holt MRN: [id number] DOB: 01-Aug-1960 Today's Date: 04/22/2012 Time: 7829-5621 PT Time Calculation (min): 29 min  PT Assessment / Plan / Recommendation Comments on Treatment Session  Patient continues to make great progress. She is very motivated and able to recall therex and mobility technique from previous admission. Planning to discharge home today    Follow Up Recommendations  Outpatient PT     Does the patient have the potential to tolerate intense rehabilitation     Barriers to Discharge        Equipment Recommendations  None recommended by PT    Recommendations for Other Services    Frequency Min 3X/week   Plan Discharge plan remains appropriate;Frequency remains appropriate    Precautions / Restrictions Precautions Precautions: Knee Knee Immobilizer - Right: Discontinue post op day 2 Restrictions Weight Bearing Restrictions: Yes RLE Weight Bearing: Weight bearing as tolerated   Pertinent Vitals/Pain     Mobility  Bed Mobility Sit to Supine: 6: Modified independent (Device/Increase time) Transfers Sit to Stand: 6: Modified independent (Device/Increase time) Stand to Sit: 6: Modified independent (Device/Increase time) Ambulation/Gait Ambulation/Gait Assistance: 5: Supervision Ambulation Distance (Feet): 300 Feet Assistive device: Rolling walker Gait Pattern: Step-through pattern Stairs: Yes Stairs Assistance: 4: Min guard Stair Management Technique: Step to pattern;Sideways;With walker Number of Stairs: 2     Exercises Total Joint Exercises Quad Sets: AROM;Right;10 reps Heel Slides: AAROM;Right;10 reps Hip ABduction/ADduction: AAROM;Right;10 reps Straight Leg Raises: AAROM;Right;10 reps   PT Diagnosis:    PT Problem List:   PT Treatment Interventions:     PT Goals Acute Rehab PT Goals PT Goal: Sit to Supine/Side - Progress: Progressing toward goal PT Goal: Sit to Stand - Progress: Met PT  Goal: Stand to Sit - Progress: Met PT Goal: Ambulate - Progress: Met PT Goal: Up/Down Stairs - Progress: Progressing toward goal PT Goal: Perform Home Exercise Program - Progress: Progressing toward goal  Visit Information  Last PT Received On: 04/22/12 Assistance Needed: +1    Subjective Data      Cognition  Overall Cognitive Status: Appears within functional limits for tasks assessed/performed Arousal/Alertness: Awake/alert Orientation Level: Appears intact for tasks assessed Behavior During Session: [facility] for tasks performed    Balance     End of Session PT - End of Session Equipment Utilized During Treatment: Gait belt Activity Tolerance: Patient tolerated treatment well Patient left: in chair;with call bell/phone within reach Nurse Communication: Mobility status   GP     Fredrich Birks 04/22/2012, 8:46 AM 04/22/2012 Fredrich Birks PTA [phone] pager [phone] office

## 2012-04-22 NOTE — Progress Notes (Signed)
ANTIBIOTIC CONSULT NOTE - FOLLOW UP  Pharmacy Consult for vancomycin Indication: septic arthritis / infected TKR  Labs:  Basename 04/22/12 0458 04/21/12 0535 04/20/12 0810 04/19/12 1842 04/19/12 0957  WBC 6.3 6.4 7.6 -- --  HGB 8.4* 8.4* 9.9* -- --  PLT 213 215 260 -- --  LABCREA -- -- -- -- --  CREATININE -- -- 0.54 0.58 0.62   Estimated Creatinine Clearance: 111.5 ml/min (by C-G formula based on Cr of 0.54).  Basename 04/22/12 0458  VANCOTROUGH 6.4*  VANCOPEAK --  Drue Dun --  GENTTROUGH --  GENTPEAK --  GENTRANDOM --  TOBRATROUGH --  TOBRAPEAK --  TOBRARND --  AMIKACINPEAK --  AMIKACINTROU --  AMIKACIN --     Assessment: 52yo female subtherapeutic on vancomycin with initial dosing for infected TKR.  Goal of Therapy:  Vancomycin trough level 15-20 mcg/ml  Plan:  Will change vancomycin to 1500mg  IV Q8H for calculated trough of ~18 and continue to monitor.  Colleen Can PharmD BCPS 04/22/2012,6:08 AM

## 2012-04-22 NOTE — Progress Notes (Signed)
Utilization review completed. Reginal Wojcicki, RN, BSN. 

## 2012-04-22 NOTE — Progress Notes (Signed)
Patient discharged in stable condition via wheelchair. Discharge instructions and prescriptions were given and explained 

## 2012-04-22 NOTE — Progress Notes (Signed)
CARE MANAGEMENT NOTE 04/22/2012  Patient:  Maria Holt, Maria Holt   Account Number:  [id number]  Date Initiated:  04/20/2012  Documentation initiated by:  [facility]  Subjective/Objective Assessment:   pt s/p I & D of right infected  knee     Action/Plan:   Discharge home on IV Vanc, ceftrioxone and oral Rifampin.   Anticipated DC Date:  04/22/2012   Anticipated DC Plan:  HOME W HOME HEALTH SERVICES      DC Planning Services  CM consult      [facility] Choice  HOME HEALTH   Choice offered to / List presented to:  C-1 Patient        HH arranged  HH-1 RN  IV Antibiotics      HH agency  [facility]   Status of service:  Completed, signed off Medicare Important Message given?   (If response is "NO", the following Medicare IM given date fields will be blank) Date Medicare IM given:   Date Additional Medicare IM given:    Discharge Disposition:  HOME W HOME HEALTH SERVICES

## 2012-04-23 ENCOUNTER — Ambulatory Visit (HOSPITAL_COMMUNITY): Payer: BC Managed Care – PPO | Admitting: Physical Therapy

## 2012-04-24 LAB — ANAEROBIC CULTURE

## 2012-04-25 ENCOUNTER — Ambulatory Visit (HOSPITAL_COMMUNITY): Payer: BC Managed Care – PPO | Admitting: Physical Therapy

## 2012-05-01 ENCOUNTER — Telehealth: Payer: Self-pay | Admitting: Infectious Disease

## 2012-05-01 MED ORDER — DOXYCYCLINE HYCLATE 100 MG PO TABS: 100.0000 mg | ORAL_TABLET | Freq: Two times a day (BID) | ORAL | Status: DC

## 2012-05-01 NOTE — Telephone Encounter (Signed)
Patient apparently had an infected PICC line and IV abx stopped after PICC removed and NO NEW picc placed  Pt grew CNS from one of her intraop cultures though no sensis done  I am calling in doxycycline in the interim  Can we set up an new PICC line with IR and start her back on vancomycin via [facility]?

## 2012-05-02 ENCOUNTER — Other Ambulatory Visit: Payer: Self-pay | Admitting: Licensed Clinical Social Worker

## 2012-05-02 ENCOUNTER — Telehealth: Payer: Self-pay | Admitting: Licensed Clinical Social Worker

## 2012-05-02 DIAGNOSIS — M1990 Unspecified osteoarthritis, unspecified site: Secondary | ICD-10-CM

## 2012-05-02 NOTE — Telephone Encounter (Signed)
  I called this patient to let her know that IR can replace her picc tomorrow at 9:00am, I left her a voicemail message.

## 2012-05-03 ENCOUNTER — Ambulatory Visit (HOSPITAL_COMMUNITY)
Admission: RE | Admit: 2012-05-03 | Discharge: 2012-05-03 | Disposition: A | Discharge status: Home / Self Care | Payer: BC Managed Care – PPO | Source: Ambulatory Visit | Attending: Infectious Disease | Admitting: Infectious Disease

## 2012-05-03 ENCOUNTER — Other Ambulatory Visit: Payer: Self-pay | Admitting: Infectious Disease

## 2012-05-03 DIAGNOSIS — T8140XA Infection following a procedure, unspecified, initial encounter: Secondary | ICD-10-CM | POA: Insufficient documentation

## 2012-05-03 DIAGNOSIS — M1990 Unspecified osteoarthritis, unspecified site: Secondary | ICD-10-CM

## 2012-05-03 DIAGNOSIS — Y849 Medical procedure, unspecified as the cause of abnormal reaction of the patient, or of later complication, without mention of misadventure at the time of the procedure: Secondary | ICD-10-CM | POA: Insufficient documentation

## 2012-05-03 NOTE — Procedures (Signed)
Successful Left UE SL PICC to basilic vein  Length 44cm, tip at lower SVC/RA No complications Ready for use.  [place], [facility]

## 2012-05-04 ENCOUNTER — Telehealth: Payer: Self-pay | Admitting: Infectious Disease

## 2012-05-04 NOTE — Telephone Encounter (Signed)
Pt with prosthetic joint infection with CNS.  She is having trouble tolerating the vancomycin it seems. EVen at slow infusion rate of nearly 2 hours for her 1.7g dose she developed tingling on her scalp all over her body, chills, and then profuse diarrhea x 6 bm watery diarrhea last night. ssx only mildly better post 2 benadryl doses and sleep. She also took ceftriaxone but the ssx occurred during vanco infusion  I have asked her to resume doxycyline 100mg  po bid until we can sort out what kind of copay daptomycin or teflaro would be  Asked her to stop her ceftriaxone  If she has further loose bm of 5-6 a aday she will have me paged and we will bring in and test for CDI  Is there any space for her in clinic next week?

## 2012-05-07 NOTE — Telephone Encounter (Signed)
Yes the 28th thru 30th

## 2012-05-07 NOTE — Telephone Encounter (Signed)
I gave the pt the Monday 415 appt on the 27th  I am going to call [facility] to try to get her approved for daptomycin vs teflaro

## 2012-05-08 ENCOUNTER — Telehealth: Payer: Self-pay | Admitting: Licensed Clinical Social Worker

## 2012-05-08 NOTE — Telephone Encounter (Signed)
I spoke with RN about starting patient back on IV abx due to allergic reaction to vanc, RN was unaware that Dr. Daiva Eves spoke with the [facility] phamacy and ordered Daptomycin. The co pay issue was worked out and [employer] should cover in full.

## 2012-05-13 ENCOUNTER — Ambulatory Visit (INDEPENDENT_AMBULATORY_CARE_PROVIDER_SITE_OTHER): Payer: BC Managed Care – PPO | Admitting: Infectious Disease

## 2012-05-13 ENCOUNTER — Encounter: Payer: Self-pay | Admitting: Infectious Disease

## 2012-05-13 VITALS — HR 102 | Temp 98.5°F | Ht 66.0 in | Wt 247.0 lb

## 2012-05-13 DIAGNOSIS — R197 Diarrhea, unspecified: Secondary | ICD-10-CM

## 2012-05-13 DIAGNOSIS — T8450XA Infection and inflammatory reaction due to unspecified internal joint prosthesis, initial encounter: Secondary | ICD-10-CM

## 2012-05-13 DIAGNOSIS — M79609 Pain in unspecified limb: Secondary | ICD-10-CM

## 2012-05-13 DIAGNOSIS — R51 Headache: Secondary | ICD-10-CM

## 2012-05-13 DIAGNOSIS — R209 Unspecified disturbances of skin sensation: Secondary | ICD-10-CM

## 2012-05-13 DIAGNOSIS — R202 Paresthesia of skin: Secondary | ICD-10-CM

## 2012-05-13 DIAGNOSIS — M79601 Pain in right arm: Secondary | ICD-10-CM | POA: Insufficient documentation

## 2012-05-13 NOTE — Progress Notes (Signed)
Subjective:    Patient ID: Maria Holt, female    DOB: 10/10/1960, 52 y.o.   MRN: [id number]  HPI  52 year old lady with history of prosthetic left knee infection sp I and D and exchange on 04/19/12. She was seen by my partner Dr. Ninetta Lights and sent home on empiric regimen of Vancomycin, rocephin and oral rifampin. One of the knee cultures DID actually grow a CNS but this was not sent for sensitivity testing by the lab. She had developed an apparent PICC line associate infection and PICC was removed and I had placed her on doxycyline while she had no IV access. New PICC was placed and pt restarted on abx (vanco and ceftriaxone) and pt did not tolerate well with flushing, what sounds like red mans syndrome as well as paresthesias, numbness, shaking chills and 8 bowel movements.. She refused reinitiation of the vancomycin and she continued on doxycycline and rifampin. I was able to get her switched to IV daptomycin and she continued on this and oral rifampin.  She is without fevers or malaise and with no pain in knee at rest and minimal pain with exertion.  She has MULTIPLE other concerns today.  #1 she has insomnia that she related to her cubicin infusion which she is trying to take earlier and earlier in the day  #2 she has paresthesias in both of her feet that are worse at night with feeling of swelling in her toes.  #3 She has paresthesias and tenderness around her eyes and maxillary sinuses  #4 She has pain and paresthesias in her right hand shich she blames on bp cuff used on this side.   I spent greater than 45 minutes with the patient including greater than 50% of time in face to face counsel of the patient and in coordination of their care.     Review of Systems  Constitutional: Negative for fever, chills, diaphoresis, activity change, appetite change, fatigue and unexpected weight change.  HENT: Negative for congestion, sore throat, rhinorrhea, sneezing, trouble swallowing and sinus  pressure.   Eyes: Negative for photophobia and visual disturbance.  Respiratory: Negative for cough, chest tightness, shortness of breath, wheezing and stridor.   Cardiovascular: Positive for leg swelling. Negative for chest pain and palpitations.  Gastrointestinal: Negative for nausea, vomiting, abdominal pain, diarrhea, constipation, blood in stool, abdominal distention and anal bleeding.  Genitourinary: Negative for dysuria, hematuria, flank pain and difficulty urinating.  Musculoskeletal: Negative for myalgias, back pain, joint swelling, arthralgias and gait problem.  Skin: Negative for color change, pallor, rash and wound.  Neurological: Positive for headaches. Negative for dizziness, tremors, weakness and light-headedness.  Hematological: Negative for adenopathy. Does not bruise/bleed easily.  Psychiatric/Behavioral: Negative for behavioral problems, confusion, sleep disturbance, dysphoric mood, decreased concentration and agitation.       Objective:   Physical Exam  Constitutional: She is oriented to person, place, and time. She appears well-developed and well-nourished. No distress.  HENT:  Head: Normocephalic and atraumatic.  Mouth/Throat: Oropharynx is clear and moist. No oropharyngeal exudate.  Eyes: Conjunctivae normal and EOM are normal. No scleral icterus.  Neck: Normal range of motion. Neck supple.  Cardiovascular: Normal rate and regular rhythm.   Pulmonary/Chest: Effort normal. No respiratory distress. She exhibits no tenderness.  Abdominal: She exhibits no distension.  Musculoskeletal: She exhibits no edema and no tenderness.       Arms:      Legs: Neurological: She is alert and oriented to person, place, and time. She  exhibits normal muscle tone. Coordination normal.  Skin: Skin is warm and dry. She is not diaphoretic. No erythema. No pallor.          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 Sensis never done, culture is gone unfortunately  Paresthesias and right arm pain: doubt any significant pathology but will get doppler to exlude DVT  Paresthesias in feet and face: could be related to rifampin , asked to take drug holiday  Diarrhea: resolved

## 2012-05-13 NOTE — Patient Instructions (Addendum)
Make followup appt on February 12th at 4pm

## 2012-05-14 ENCOUNTER — Ambulatory Visit (HOSPITAL_COMMUNITY)
Admission: RE | Admit: 2012-05-14 | Discharge: 2012-05-14 | Disposition: A | Discharge status: Home / Self Care | Payer: BC Managed Care – PPO | Source: Ambulatory Visit | Attending: Infectious Disease | Admitting: Infectious Disease

## 2012-05-14 DIAGNOSIS — M79609 Pain in unspecified limb: Secondary | ICD-10-CM

## 2012-05-14 DIAGNOSIS — M79601 Pain in right arm: Secondary | ICD-10-CM

## 2012-05-14 NOTE — Progress Notes (Signed)
Right:  No evidence of DVT or superficial thrombosis.

## 2012-05-22 ENCOUNTER — Inpatient Hospital Stay: Payer: BC Managed Care – PPO | Admitting: Infectious Diseases

## 2012-05-27 ENCOUNTER — Ambulatory Visit (HOSPITAL_COMMUNITY)
Admission: RE | Admit: 2012-05-27 | Discharge: 2012-05-27 | Disposition: A | Discharge status: Home / Self Care | Payer: BC Managed Care – PPO | Source: Ambulatory Visit | Attending: Orthopedic Surgery | Admitting: Orthopedic Surgery

## 2012-05-27 DIAGNOSIS — Z96659 Presence of unspecified artificial knee joint: Secondary | ICD-10-CM | POA: Insufficient documentation

## 2012-05-27 DIAGNOSIS — R262 Difficulty in walking, not elsewhere classified: Secondary | ICD-10-CM | POA: Insufficient documentation

## 2012-05-27 DIAGNOSIS — M6281 Muscle weakness (generalized): Secondary | ICD-10-CM | POA: Insufficient documentation

## 2012-05-27 DIAGNOSIS — M25569 Pain in unspecified knee: Secondary | ICD-10-CM | POA: Insufficient documentation

## 2012-05-27 DIAGNOSIS — IMO0001 Reserved for inherently not codable concepts without codable children: Secondary | ICD-10-CM | POA: Insufficient documentation

## 2012-05-27 NOTE — Evaluation (Signed)
Physical Therapy Evaluation  Patient Details  Name: Maria Holt MRN: [id number] Date of Birth: Apr 22, 1960  Today's Date: 05/27/2012 Time: 1152-1230 PT Time Calculation (min): 38 min Charges: 1 eval, 10' TE Visit#: 1 of 8   Re-eval: 06/27/12  Re-eval:   Assessment Diagnosis: R TKR Surgical Date: 03/19/12 Next MD Visit: Dr. Dion Saucier - June 17, 2012 Prior Therapy: HHPT  Past Medical History:  Past Medical History  Diagnosis Date  . GERD (gastroesophageal reflux disease)   . Hypertension   . Chronic lower back pain   . Post-traumatic osteoarthritis of right knee 1989    "tore ACL; failed reconstruction" (03/19/2012)  . OSA on CPAP 2005    CPAP-rarely uses  . De Quervain's disease (tenosynovitis)     "left hand" (03/19/2012)  . Infection of total right knee replacement 04/19/2012   Past Surgical History:  Past Surgical History  Procedure Laterality Date  . Anterior cruciate ligament repair  1989    "right" (03/19/2012)  . Knee arthroscopy  07/2001    RIGHT  . Anterior cervical decomp/discectomy fusion  01/2001  . Total knee arthroplasty  03/19/2012    "right" (03/19/2012)  . Total knee arthroplasty  03/19/2012    Procedure: TOTAL KNEE ARTHROPLASTY;  Surgeon: Eulas Post, MD;  Location: [facility] OR;  Service: Orthopedics;  Laterality: Right;  . Back surgery      C 5-6 fusion  . I&d knee with poly exchange  04/19/2012    Procedure: IRRIGATION AND DEBRIDEMENT KNEE WITH POLY EXCHANGE;  Surgeon: Eulas Post, MD;  Location: [facility] OR;  Service: Orthopedics;  Laterality: Right;  irrigation and debridement right knee arthroplasty with poly exhange    Subjective Symptoms/Limitations Pertinent History: Pt is referred back to PT for her R TKR, she only attended her I.E. and called her MD who had her come in for an infection to her knee in which he had cleanse the knee and replace the plastic.  She was in the hospital from 04/19/12-04/22/12.  Her c/co is difficulty with AROM (difficulty getting in  and out of cars), decreased stamina and feeling down and not feeling well because of her infection.  She now has a PICC line and is pushing cubicin 1x/day 20 ml. (She was onvanco and ceftriaxone and did not tolerate well and was then switched) she feels that her toes feel swollen (pain when a sheet would touch her toes).  She recieved her TKR because back in her 20's she tore her ACL and eventaully it failed and she was bone on bone.  How long can you stand comfortably?: 10-15 minutes How long can you walk comfortably?: 1/4 mile independently 10-15 minutes Pain Assessment Currently in Pain?: Yes Pain Score: 0-No pain Pain Location: Knee Pain Orientation: Right Pain Type: Acute pain;Surgical pain Pain Onset: More than a month ago Pain Relieving Factors: pain medication (ibuprofen).  Pain range: 0-3/10.  Prior Functioning  Prior Function Vocation: Full time employment Vocation Requirements: [employer] Comments: she enjoys walking , shopping, being with her friends and family.   Cognition/Observation Observation/Other Assessments Observations: SLR 20 degrees knee flexion  Sensation/Coordination/Flexibility/Functional Tests  Functional Test: ABC: 69%  Assessment RLE AROM (degrees) Right Knee Extension: 13 Right Knee Flexion: 89 RLE PROM (degrees) Right Knee Extension: 10 Right Knee Flexion: 94 RLE Strength Right Hip Flexion: 5/5 Right Hip Extension: 4/5 Right Hip ABduction: 4/5 Right Hip ADduction: 5/5 Right Knee Flexion: 5/5 Right Knee Extension: 5/5 Palpation Palpation: pain and tenderness to poplieteal region  with maximal fascial restrictions.  Decreased patellar and fibular head mobility.   Mobility/Balance  Ambulation/Gait Ambulation/Gait: Yes Assitive Device: SPC Gait Pattern: Antalgic;Decreased hip/knee flexion - left;Left genu recurvatum;Trunk flexed   Exercise/Treatments Stretches Quad Stretch: 1 rep;30 seconds Supine Terminal Knee Extension: Right;10  reps Straight Leg Raises: 10 reps Sidelying Hip ABduction: Right;10 reps Prone  Hamstring Curl: 5 reps;Limitations Hamstring Curl Limitations: AAROM 5 sec holds Hip Extension: Right;10 reps  Physical Therapy Assessment and Plan PT Assessment and Plan Clinical Impression Statement: Pt is a 52 year old female referred to PT s/p R TKR with subsequent infection and revision of plastic prosethics who is curretnly recieving IV antibiotics for her knee infection.  She also presents with what appears to be CRPS of bilateral feet.   At this time she is severly limited by her ROM which is hindering her QOL and limiting her RTW status. Pt will benefit from skilled therapeutic intervention in order to improve on the following deficits: Decreased activity tolerance;Decreased balance;Decreased mobility;Decreased range of motion;Decreased strength;Difficulty walking;Increased edema;Pain;Abnormal gait;Impaired perceived functional ability;Increased fascial restricitons Rehab Potential: Good Clinical Impairments Affecting Rehab Potential: pt able to attend 2x/week secondary to copay PT Frequency: Min 3X/week PT Duration: 8 weeks PT Treatment/Interventions: Gait training;Therapeutic activities;Therapeutic exercise;Manual techniques;Modalities;Stair training;Functional mobility training;Patient/family education PT Plan: Bike (if pt has not completed in the morning), TM for normalized gait mechanics, functional exercise to improve AROM (squats, supine and prone TKE, prone hang) manual techniques to improve knee extension and flexion.  Consider JAS if pt ROM is not improving quickly.    Educate and demonstrate skin desensitization strategies for her feet.   Goals Home Exercise Program Pt will Perform Home Exercise Program: Independently PT Goal: Perform Home Exercise Program - Progress: Goal set today PT Short Term Goals Time to Complete Short Term Goals: 2 weeks PT Short Term Goal 1: Pt will improve L knee  AROM 5-110 degrees in order to get into and out of the car with greater ease.   PT Short Term Goal 2: Pt will improve LE strength in order to gain greater L knee AROM. PT Short Term Goal 3: Pt pain level to be decreased to no greater than a 3 to be sleeping through the night PT Long Term Goals Time to Complete Long Term Goals: 8 weeks PT Long Term Goal 1: Pt ROM to be 3 to 115 to allow normalized gait mechanics to decrease risk of secondary injury.   PT Long Term Goal 2: Pt will improve her LE flexibility and decrease fascial restrictions in order to tolerate sitting for 2 hours for her job requirements.   Long Term Goal 3: Pt will improve her LE strength in order to ambulate independently in outdoor environment for improved QOL.   Long Term Goal 4: Pt will improve activity tolerance in order to ambulate for 1 hour in order to continue with community ambulation.   PT Long Term Goal 5: Pt will improve her ABC to 82% for improved percieved functional ability.    Problem List Patient Active Problem List  Diagnosis  . Post-traumatic osteoarthritis of left knee  . BMI 40.0-44.9, adult  . Difficulty in walking  . Stiffness of joint, not elsewhere classified, lower leg  . Weakness of right leg  . Infection of total right knee replacement  . Postoperative anemia due to acute blood loss  . Right arm pain   Elysse Polidore, PT 05/27/2012, 12:41 PM  Physician Documentation Your signature is required to indicate approval of  the treatment plan as stated above.  Please sign and either send electronically or make a copy of this report for your files and return this physician signed original.   Please mark one 1.__approve of plan  2. ___approve of plan with the following conditions.   ______________________________                                                          _____________________ Physician Signature                                                                                                              Date

## 2012-05-28 ENCOUNTER — Ambulatory Visit (HOSPITAL_COMMUNITY)
Admission: RE | Admit: 2012-05-28 | Discharge: 2012-05-28 | Disposition: A | Discharge status: Home / Self Care | Payer: BC Managed Care – PPO | Source: Ambulatory Visit | Attending: Family Medicine | Admitting: Family Medicine

## 2012-05-28 DIAGNOSIS — M25669 Stiffness of unspecified knee, not elsewhere classified: Secondary | ICD-10-CM

## 2012-05-28 DIAGNOSIS — R262 Difficulty in walking, not elsewhere classified: Secondary | ICD-10-CM

## 2012-05-28 DIAGNOSIS — R29898 Other symptoms and signs involving the musculoskeletal system: Secondary | ICD-10-CM

## 2012-05-28 NOTE — Progress Notes (Signed)
Physical Therapy Treatment Patient Details  Name: Maria Holt MRN: [id number] Date of Birth: Nov 27, 1960  Today's Date: 05/28/2012 Time: 1478-2956 PT Time Calculation (min): 71 min  Visit#: 2 of 8  Re-eval: 06/27/12 Assessment Diagnosis: R TKR Surgical Date: 03/19/12 Next MD Visit: Dr. Dion Saucier - June 17, 2012 Prior Therapy: HHPT Charge: Gait 8', therex 42', ice 10', pt education x 8'  Authorization: [employer]  Authorization Time Period:    Authorization Visit#:   of     Subjective: Symptoms/Limitations Symptoms: Pt stated compliance with HEP, no questions with any exercises.  No real pain but pt has forgotten any ibuprofen prior therapy today.  Pt has been walking .25 mile every day. Pain Assessment Currently in Pain?: No/denies  Precautions/Restrictions  Precautions Precautions: None  Exercise/Treatments Aerobic Stationary Bike: 6' @ 1.0 seat 11 beginning of session for ROM Tread Mill: Gait training x 8' .65-.70  cyc/sec for improved gait mechanics, cueing for posture and to increase stride length for more normalized gait Standing Functional Squat: 10 reps;3 seconds Rocker Board: 2 minutes;Limitations Rocker Board Limitations: R/L with HHA Supine Quad Sets: 10 reps Terminal Knee Extension: Right;10 reps Patellar Mobs: Instructed Knee Extension: PROM Prone  Hip Extension: Right;10 reps Prone Knee Hang: 5 minutes;Limitations Prone Knee Hang Limitations: STM to hamstrings (medial > lateral) Other Prone Exercises: TKE 10x 10"   Modalities Modalities: Cryotherapy Manual Therapy Manual Therapy: Joint mobilization Joint Mobilization: patella mobs all directions and tib/fib to reduce adhesions and pain. Cryotherapy Number Minutes Cryotherapy: 10 Minutes Cryotherapy Location: Knee Type of Cryotherapy: Ice pack  Physical Therapy Assessment and Plan PT Assessment and Plan Clinical Impression Statement: Began PT treatment for R TKR for ROM and functional strengthening.   Pt tolerated well towards total session, able to demonstrate appropriate technique with cueing for equal weight distrubishion with squats.  Gait training on TM with cueing for equal stride length, heel to toe gait and posture.  Pt instructed scar tissue massage to incisions on R knee and different strategies for desensitization of feet and patella mobs to improve patella tracking for ROM and manual tib/fib to reduce adhesions and pain. PT Plan: Continue with current POC.  Begin with bike for ROM (if pt has not completed earlier that morning), TM for gait training and functional exercises to improve AROM, manual techniques to improve knee extension and flexion.  Consider JAS if ROM does not improve quickly.    Goals    Problem List Patient Active Problem List  Diagnosis  . Post-traumatic osteoarthritis of left knee  . BMI 40.0-44.9, adult  . Difficulty in walking  . Stiffness of joint, not elsewhere classified, lower leg  . Weakness of right leg  . Infection of total right knee replacement  . Postoperative anemia due to acute blood loss  . Right arm pain    PT - End of Session Activity Tolerance: Patient tolerated treatment well General Behavior During Session: [facility] for tasks performed Cognition: [facility] for tasks performed  GP    Juel Burrow 05/28/2012, 5:24 PM

## 2012-05-29 ENCOUNTER — Telehealth: Payer: Self-pay | Admitting: *Deleted

## 2012-05-29 ENCOUNTER — Ambulatory Visit: Payer: BC Managed Care – PPO | Admitting: Infectious Disease

## 2012-05-29 NOTE — Telephone Encounter (Signed)
Phone call from pt, unable to keep OV appt 05/30/12 due to snow.   She has enough for one dose today and tomorrow.  Wants to know what Dr. Daiva Eves wants to do.  Phone call to Dr. Daiva Eves.   Discontinue IV ABX after last dose, pull PICC by [facility] RN.  Pt to start oral doxycycline 100 mg tablets, one tablet BID w/ 5 refills.  Phone call to pt.  Pt has doxycycline tablets at home with one refill available.  Phone call to [facility] with order to d/c IV ABX and pull PICC.  [facility] pharmacist verbalized order back to this RN.

## 2012-05-30 ENCOUNTER — Ambulatory Visit: Payer: BC Managed Care – PPO | Admitting: Infectious Disease

## 2012-06-04 ENCOUNTER — Ambulatory Visit (HOSPITAL_COMMUNITY)
Admission: RE | Admit: 2012-06-04 | Discharge: 2012-06-04 | Disposition: A | Discharge status: Home / Self Care | Payer: BC Managed Care – PPO | Source: Ambulatory Visit | Attending: Physical Therapy | Admitting: Physical Therapy

## 2012-06-04 DIAGNOSIS — M25669 Stiffness of unspecified knee, not elsewhere classified: Secondary | ICD-10-CM

## 2012-06-04 DIAGNOSIS — R29898 Other symptoms and signs involving the musculoskeletal system: Secondary | ICD-10-CM

## 2012-06-04 DIAGNOSIS — R262 Difficulty in walking, not elsewhere classified: Secondary | ICD-10-CM

## 2012-06-04 NOTE — Progress Notes (Signed)
Physical Therapy Treatment Patient Details  Name: CAMBRYN CHARTERS MRN: [id number] Date of Birth: [birth year]  Today's Date: 06/04/2012 Time: 7829-5621 PT Time Calculation (min): 56 min Charges: 25' TE, 20' Manual, 1 ice Visit#: 3 of 8  Re-eval: 06/27/12    Subjective: Symptoms/Limitations Symptoms: Pt reports that she is doing well with her exercises and has been working a lot on straightening her leg.   Exercise/Treatments Stretches Gastroc Stretch: 3 reps;30 seconds Aerobic Stationary Bike: 6' @ 1.0 seat 11 beginning of session for ROM Standing Lateral Step Up: Right;10 reps;Hand Hold: 0;Step Height: 4" Forward Step Up: Right;15 reps;Hand Hold: 0;Step Height: 4" Step Down: Right;15 reps;Hand Hold: 0;Step Height: 2" Rocker Board: 2 minutes Rocker Board Limitations: R/L with HHA Supine Heel Slides: 15 reps Terminal Knee Extension: Right;10 reps (5 sec holds) Prone  Prone Knee Hang: 5 minutes Prone Knee Hang Limitations: STM to hamstrings (medial > lateral)  Modalities Modalities: Cryotherapy Manual Therapy Manual Therapy: Myofascial release Joint Mobilization: Supine: patellar and R knee grade II to improve knee flexion and extension w/PROM after to improve ROM.  Myofascial Release: PRONE: to R knee popliteal and lateral knee to decrease pain.  Cryotherapy Number Minutes Cryotherapy: 10 Minutes Cryotherapy Location: Knee Type of Cryotherapy: Ice pack  Physical Therapy Assessment and Plan PT Assessment and Plan Clinical Impression Statement: Able to achieve ROM: R knee AROM: 5-90; PROM: 3-95.  Has signficant improvement in distal quadricep activation allowing for greater knee extension.   PT Plan: Continue to address ROM limitations and functional strength w/TM, wall squats, cybex, gait activities to improve balance and gait mechanics.     Goals    Problem List Patient Active Problem List  Diagnosis  . Post-traumatic osteoarthritis of left knee  . BMI 40.0-44.9,  adult  . Difficulty in walking  . Stiffness of joint, not elsewhere classified, lower leg  . Weakness of right leg  . Infection of total right knee replacement  . Postoperative anemia due to acute blood loss  . Right arm pain    PT - End of Session Activity Tolerance: Patient tolerated treatment well General Behavior During Session: [facility] for tasks performed Cognition: [facility] for tasks performed  Walburga Hudman, PT 06/04/2012, 1:53 PM

## 2012-06-06 ENCOUNTER — Ambulatory Visit (HOSPITAL_COMMUNITY)
Admission: RE | Admit: 2012-06-06 | Discharge: 2012-06-06 | Disposition: A | Discharge status: Home / Self Care | Payer: BC Managed Care – PPO | Source: Ambulatory Visit | Attending: Orthopedic Surgery | Admitting: Orthopedic Surgery

## 2012-06-06 NOTE — Progress Notes (Signed)
Physical Therapy Treatment Patient Details  Name: Maria Holt MRN: [id number] Date of Birth: [birth year]  Today's Date: 06/06/2012 Time: 1305-1405 PT Time Calculation (min): 60 min Visit#: 4 of 8  Re-eval: 06/27/12 Charges:  Therex 35', manual 10', ice 10'   Subjective:  Pt. Reports compliance with HEP.  States she is not hurting today.     Exercise/Treatments Stretches Gastroc Stretch: 3 reps;30 seconds Aerobic Stationary Bike: 6' @ 4.0 seat 11 beginning of session for ROM Standing Lateral Step Up: 15 reps;Step Height: 4";Right;Hand Hold: 0 Forward Step Up: 15 reps;Step Height: 4";Right;Hand Hold: 0 Step Down: 15 reps;Right;Hand Hold: 2;Hand Hold: 1 Wall Squat: 10 reps;5 seconds Rocker Board: 2 minutes Rocker Board Limitations: R/L with HHA Supine Heel Slides: 15 reps Knee Extension: PROM Knee Flexion: PROM Prone  Prone Knee Hang: 5 minutes Prone Knee Hang Limitations: STM to hamstrings (medial > lateral) Other Prone Exercises: TKE 10x 10"   Modalities Modalities: Cryotherapy Manual Therapy Myofascial Release: R knee:  PRONE: to posterior knee with knee hang; supine: scar massage and MFR to anterior knee, lateral and medial Cryotherapy Number Minutes Cryotherapy: 10 Minutes Cryotherapy Location: Knee Type of Cryotherapy: Ice pack  Physical Therapy Assessment and Plan PT Assessment and Plan Clinical Impression Statement: Able to achieve 5 more degrees of flexion both AROM/PROM (95 and 100 respectively).  Pt. with alot of scar tissue and fascial restrictions in anterior knee.  Added MFR to anterior knee prior to and with PROM for flexion.  Added wallslides with VCs for form and longer hold time.   PT Plan: Continue to address ROM limitations and functional strength.  Add SLS and vector stance as well as continued manual techniques to decrease adhesions.     Problem List Patient Active Problem List  Diagnosis  . Post-traumatic osteoarthritis of left knee  . BMI  40.0-44.9, adult  . Difficulty in walking  . Stiffness of joint, not elsewhere classified, lower leg  . Weakness of right leg  . Infection of total right knee replacement  . Postoperative anemia due to acute blood loss  . Right arm pain    PT - End of Session Activity Tolerance: Patient tolerated treatment well General Behavior During Session: [facility] for tasks performed Cognition: [facility] for tasks performed   Lurena Nida, PTA/CLT 06/06/2012, 2:42 PM

## 2012-06-10 ENCOUNTER — Encounter: Payer: Self-pay | Admitting: Infectious Disease

## 2012-06-10 ENCOUNTER — Ambulatory Visit (INDEPENDENT_AMBULATORY_CARE_PROVIDER_SITE_OTHER): Payer: BC Managed Care – PPO | Admitting: Infectious Disease

## 2012-06-10 VITALS — BP 136/85 | HR 103 | Temp 98.6°F | Wt 244.0 lb

## 2012-06-10 DIAGNOSIS — R209 Unspecified disturbances of skin sensation: Secondary | ICD-10-CM

## 2012-06-10 DIAGNOSIS — K219 Gastro-esophageal reflux disease without esophagitis: Secondary | ICD-10-CM

## 2012-06-10 DIAGNOSIS — T8453XA Infection and inflammatory reaction due to internal right knee prosthesis, initial encounter: Secondary | ICD-10-CM

## 2012-06-10 DIAGNOSIS — Z96659 Presence of unspecified artificial knee joint: Secondary | ICD-10-CM

## 2012-06-10 DIAGNOSIS — T8450XA Infection and inflammatory reaction due to unspecified internal joint prosthesis, initial encounter: Secondary | ICD-10-CM

## 2012-06-10 DIAGNOSIS — B49 Unspecified mycosis: Secondary | ICD-10-CM

## 2012-06-10 DIAGNOSIS — B379 Candidiasis, unspecified: Secondary | ICD-10-CM

## 2012-06-10 DIAGNOSIS — R202 Paresthesia of skin: Secondary | ICD-10-CM

## 2012-06-10 DIAGNOSIS — G4700 Insomnia, unspecified: Secondary | ICD-10-CM

## 2012-06-10 MED ORDER — DOXYCYCLINE HYCLATE 100 MG PO TABS: 100.0000 mg | ORAL_TABLET | Freq: Two times a day (BID) | ORAL | Status: DC

## 2012-06-10 MED ORDER — FLUCONAZOLE 100 MG PO TABS: 100.0000 mg | ORAL_TABLET | Freq: Every day | ORAL | Status: DC

## 2012-06-10 NOTE — Progress Notes (Signed)
Subjective:    Patient ID: Maria Holt, female    DOB: 07-01-1960, 52 y.o.   MRN: [id number]  HPI  52 year old lady with history of prosthetic left knee infection sp I and D and exchange on 04/19/12. She was seen by my partner Dr. Ninetta Lights and sent home on empiric regimen of Vancomycin, rocephin and oral rifampin. One of the knee cultures DID actually grow a CNS but this was not sent for sensitivity testing by the lab. She had developed an apparent PICC line associate infection and PICC was removed and I had placed her on doxycyline while she had no IV access. New PICC was placed and pt restarted on abx (vanco and ceftriaxone) and pt did not tolerate well with flushing, what sounds like red mans syndrome as well as paresthesias, numbness, shaking chills and 8 bowel movements.. She refused reinitiation of the vancomycin and she continued on doxycycline and rifampin. I was able to get her switched to IV daptomycin and she continued on this and oral rifampin.  When I last saw her she was  without fevers or malaise and with no pain in knee at rest and minimal pain with exertion.  I dc'd her rifampin at that time because she had multituide of other symptoms including paresthesias, Nausea etc that I thought might be due to this. SHe has had improvement in most except persistent numbnesss in right arm (where we ruled out dvt)  She now has yeast infection outside of vagina not clearing  With topical antifungals.      Review of Systems  Constitutional: Negative for fever, chills, diaphoresis, activity change, appetite change, fatigue and unexpected weight change.  HENT: Negative for congestion, sore throat, rhinorrhea, sneezing, trouble swallowing and sinus pressure.   Eyes: Negative for photophobia and visual disturbance.  Respiratory: Negative for cough, chest tightness, shortness of breath, wheezing and stridor.   Cardiovascular: Negative for chest pain, palpitations and leg swelling.    Gastrointestinal: Negative for nausea, vomiting, abdominal pain, diarrhea, constipation, blood in stool, abdominal distention and anal bleeding.  Genitourinary: Negative for dysuria, hematuria, flank pain and difficulty urinating.  Musculoskeletal: Negative for myalgias, back pain, joint swelling, arthralgias and gait problem.  Skin: Positive for rash. Negative for color change, pallor and wound.  Neurological: Negative for dizziness, tremors, weakness and light-headedness.  Hematological: Negative for adenopathy. Does not bruise/bleed easily.  Psychiatric/Behavioral: Positive for sleep disturbance. Negative for behavioral problems, confusion, dysphoric mood, decreased concentration and agitation. The patient is nervous/anxious.        Objective:   Physical Exam  Constitutional: She is oriented to person, place, and time. She appears well-developed and well-nourished. No distress.  HENT:  Head: Normocephalic and atraumatic.  Mouth/Throat: Oropharynx is clear and moist. No oropharyngeal exudate.  Eyes: Conjunctivae and EOM are normal. Pupils are equal, round, and reactive to light. No scleral icterus.  Neck: Normal range of motion. Neck supple. No JVD present.  Cardiovascular: Normal rate, regular rhythm and normal heart sounds.  Exam reveals no gallop and no friction rub.   No murmur heard. Pulmonary/Chest: Effort normal and breath sounds normal. No respiratory distress. She has no wheezes. She has no rales. She exhibits no tenderness.  Abdominal: She exhibits no distension and no mass. There is no tenderness. There is no rebound and no guarding.  Musculoskeletal: She exhibits no edema and no tenderness.       Legs: Lymphadenopathy:    She has no cervical adenopathy.  Neurological: She is alert  and oriented to person, place, and time. She has normal reflexes. She exhibits normal muscle tone. Coordination normal.  Skin: Skin is warm and dry. She is not diaphoretic. No erythema. No pallor.   Psychiatric: She has a normal mood and affect. Her behavior is normal. Judgment and thought content normal.          Assessment & Plan:  Prosthetic joint infection: Finished IV cubicin now on doxy and try to finish 6 months total abx . Check esr and crp today  CNS: culprit we believe. Sensis never done, culture is gone unfortunately   Paresthesias and right arm pain: no dvt. Perhaps nerve hit with PICC insertion, removal?  Paresthesias in feet and face: c largely resolved  Yeast infection:  will give fluconazole for days   GERD: on pPi

## 2012-06-11 ENCOUNTER — Ambulatory Visit (HOSPITAL_COMMUNITY)
Admission: RE | Admit: 2012-06-11 | Discharge: 2012-06-11 | Disposition: A | Discharge status: Home / Self Care | Payer: BC Managed Care – PPO | Source: Ambulatory Visit | Attending: Orthopedic Surgery | Admitting: Orthopedic Surgery

## 2012-06-11 LAB — C-REACTIVE PROTEIN: CRP: 2.1 mg/dL — ABNORMAL HIGH (ref <0.60)

## 2012-06-11 LAB — SEDIMENTATION RATE: Sed Rate: 27 mm/hr — ABNORMAL HIGH (ref 0–22)

## 2012-06-11 NOTE — Evaluation (Signed)
Physical Therapy Re-evaluation  Patient Details  Name: Maria Holt MRN: [id number] Date of Birth: 11/30/1960  Today's Date: 06/11/2012 Time: 1478-2956 PT Time Calculation (min): 53 min Charges: 1 ROM, 1 MMT, 15' TE, 25' Manual Visit#: 5 of 8  Re-eval: 07/11/12  Diagnosis: R TKR Surgical Date: 03/19/12 Next MD Visit: Dr. Dion Saucier - June 17, 2012  Subjective Symptoms/Limitations Symptoms: Pt states she is tired today.  She took sleeping pills and are still working.   How long can you sit comfortably?: an hour How long can you stand comfortably?: 30-40 minutes. How long can you walk comfortably?: 1/2 mile 20 minutes (improved her pace) 30 minutes in the store./  Pain Assessment Currently in Pain?: Yes Pain Score:   1 Pain Location: Knee  Sensation/Coordination/Flexibility/Functional Tests Functional Tests Functional Tests: ABC  (was 69%)  RLE AROM (degrees) Right Knee Extension: 7 (was 20) Right Knee Flexion: 103 (prone; was 90) RLE PROM (degrees) Right Knee Extension: 5 (was 10) Right Knee Flexion: 100 (was 94)  RLE Strength Right Hip Flexion: 5/5 (was 5/5) Right Hip Extension: 5/5 (was 4/5) Right Hip ABduction: 5/5 (was 4/5) Right Hip ADduction: 5/5 (was 5/5) Right Knee Flexion: 5/5 (was 4/5) Right Knee Extension: 5/5 (was 3+/5) Right Ankle Dorsiflexion:  (was 3+/5)  Exercise/Treatments Mobility/Balance  Ambulation/Gait Gait Pattern: Antalgic;Lateral hip instability; decreased R knee flexion    Aerobic Stationary Bike: 7' 1.0 for ROM Standing Lateral Step Up: 20 reps;Hand Hold: 0;Step Height: 4";Right Forward Step Up: 15 reps;Right;Hand Hold: 0;Step Height: 6" Step Down: Right;20 reps;Hand Hold: 0;Step Height: 2" Stair Training: Gym steps 2 RT Stepping onto chair to practice balance  Modalities Modalities: Cryotherapy Manual Therapy Manual Therapy: Myofascial release Myofascial Release: Supine: patellar and R lateral knee to decrease fascial  restrictions.   PT Assessment and Plan Ms. Dowd has attended 5 OP PT vists s/p R TKR w/following findings: she continues to improve her knee AROM and strength is WNL, improved gait speed and mechanics.  She will continue to benefit from skilled OP PT in order to address remaining ROM, gait impairments and functional strength deficits.  Frequency: 2x/week Duration: 4 weeks Plan: continue with SLS and progress to vector stance.  Continue to progress functional ROM and strength.   Goals Pt will Perform Home Exercise Program: Independently: Met PT Short Term Goals: 2 weeks PT Short Term Goal 1: Pt will improve L knee AROM 5-110 degrees in order to get into and out of the car with greater ease.  (7-103) : Progressing toward goal PT Short Term Goal 2: Pt will improve LE strength in order to gain greater L knee AROM: Progressing toward goal PT Short Term Goal 3: Pt pain level to be decreased to no greater than a 3 to be sleeping through the night: Met PT Long Term Goals: 8 weeks PT Long Term Goal 1: Pt ROM to be 3 to 115 to allow normalized gait mechanics to decrease risk of secondary injury. : Progressing toward goal PT Long Term Goal 2: Pt will improve her LE flexibility and decrease fascial restrictions in order to tolerate sitting for 2 hours for her job requirements.  (sitting 60 minutes) Long Term Goal 3: Pt will improve her LE strength in order to ambulate independently in outdoor environment for improved QOL. : Met Long Term Goal 4: Pt will improve activity tolerance in order to ambulate for 1 hour in order to continue with community ambulation.  (2 laps around her neighborhood 20 minutes) PT Long Term  Goal 5: Pt will improve her ABC to 82% for improved percieved functional ability. : Met   Problem List Patient Active Problem List  Diagnosis  . Post-traumatic osteoarthritis of left knee  . BMI 40.0-44.9, adult  . Difficulty in walking  . Stiffness of joint, not elsewhere classified,  lower leg  . Weakness of right leg  . Infection of total right knee replacement  . Postoperative anemia due to acute blood loss  . Right arm pain  . Yeast infection    PT - End of Session Activity Tolerance: Patient tolerated treatment well General Behavior During Session: [facility] for tasks performed Cognition: [facility] for tasks performed  Micael Barb, PT 06/11/2012, 2:00 PM  Physician Documentation Your signature is required to indicate approval of the treatment plan as stated above.  Please sign and either send electronically or make a copy of this report for your files and return this physician signed original.   Please mark one 1.__approve of plan  2. ___approve of plan with the following conditions.   ______________________________                                                          _____________________ Physician Signature                                                                                                             Date

## 2012-06-13 ENCOUNTER — Ambulatory Visit (HOSPITAL_COMMUNITY)
Admission: RE | Admit: 2012-06-13 | Discharge: 2012-06-13 | Disposition: A | Discharge status: Home / Self Care | Payer: BC Managed Care – PPO | Source: Ambulatory Visit | Attending: Orthopedic Surgery | Admitting: Orthopedic Surgery

## 2012-06-13 DIAGNOSIS — M25669 Stiffness of unspecified knee, not elsewhere classified: Secondary | ICD-10-CM

## 2012-06-13 DIAGNOSIS — R29898 Other symptoms and signs involving the musculoskeletal system: Secondary | ICD-10-CM

## 2012-06-13 NOTE — Progress Notes (Signed)
Physical Therapy Treatment Patient Details  Name: Maria Holt MRN: [id number] Date of Birth: 23-Oct-1960  Today's Date: 06/13/2012 Time: 7829-5621 PT Time Calculation (min): 43 min  Visit#: 6 of 13  Re-eval: 07/11/12   Charge therex 30', manual 10'  Authorization: [employer]      Subjective: Symptoms/Limitations Symptoms: Pt reported riding bike and husband doing PROM earlier today.  Min pain scale 1/10 Pain Assessment Currently in Pain?: Yes Pain Score:   1 Pain Location: Knee Pain Orientation: Right  Objective   Exercise/Treatments Aerobic Stationary Bike: 8' 1.0 for ROM Standing Lateral Step Up: 20 reps;Hand Hold: 0;Step Height: 4";Right Forward Step Up: 20 reps;Hand Hold: 0;Step Height: 6" Step Down: Right;20 reps;Hand Hold: 0;Step Height: 4" Rocker Board: 2 minutes Rocker Board Limitations: R/L with intermittent HHA SLS: L32", R 7". 3x 30" with 1 finger HHA Supine Knee Extension: PROM Knee Flexion: PROM Prone  Prone Knee Hang: Limitations Prone Knee Hang Limitations: 10 minutes MFR medial and lateral hamstring insertion points to reduce adhesions Other Prone Exercises: TKE 10x 10"   Manual Therapy Manual Therapy: Myofascial release Joint Mobilization: supine: patellar and tib/fib grade i-III to improve knee flexion and extensions w/PROM after Myofascial Release: Prone MFR to medial and lateral hamstring insertion points to reduce adhesions  Physical Therapy Assessment and Plan PT Assessment and Plan Clinical Impression Statement: Began balance training to reduce risk of falls.  Pt with alot of scar tissue and fascial restrictions anterior and posterior knee, MFR complete to reduce adhesions.  Pt tolerated PROM well, offered ice but declines, encouraged to ice tonight to help with pain and edema. PT Plan: Continue to address ROM limitations and functional strength.  Continue SLS and progress to vector stance as well as continued manual techniques to decrease  adhesions.    Goals    Problem List Patient Active Problem List  Diagnosis  . Post-traumatic osteoarthritis of left knee  . BMI 40.0-44.9, adult  . Difficulty in walking  . Stiffness of joint, not elsewhere classified, lower leg  . Weakness of right leg  . Infection of total right knee replacement  . Postoperative anemia due to acute blood loss  . Right arm pain  . Yeast infection    PT - End of Session Activity Tolerance: Patient tolerated treatment well General Behavior During Session: [facility] for tasks performed Cognition: [facility] for tasks performed  GP    Juel Burrow 06/13/2012, 4:28 PM

## 2012-06-19 ENCOUNTER — Encounter: Payer: Self-pay | Admitting: Infectious Disease

## 2012-06-27 ENCOUNTER — Encounter: Payer: Self-pay | Admitting: Infectious Disease

## 2012-08-15 ENCOUNTER — Ambulatory Visit (INDEPENDENT_AMBULATORY_CARE_PROVIDER_SITE_OTHER): Payer: BC Managed Care – PPO | Admitting: Infectious Disease

## 2012-08-15 ENCOUNTER — Encounter: Payer: Self-pay | Admitting: Infectious Disease

## 2012-08-15 VITALS — BP 105/73 | HR 96 | Temp 99.1°F | Wt 247.0 lb

## 2012-08-15 DIAGNOSIS — Z5189 Encounter for other specified aftercare: Secondary | ICD-10-CM

## 2012-08-15 DIAGNOSIS — B957 Other staphylococcus as the cause of diseases classified elsewhere: Secondary | ICD-10-CM

## 2012-08-15 DIAGNOSIS — T8453XD Infection and inflammatory reaction due to internal right knee prosthesis, subsequent encounter: Secondary | ICD-10-CM

## 2012-08-15 NOTE — Progress Notes (Signed)
Subjective:    Patient ID: Maria Holt, female    DOB: [birth year], 52 y.o.   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 She was seen by my partner Dr. Ninetta Lights and sent home on empiric regimen of Vancomycin, rocephin and oral rifampin. One of the knee cultures DID actually grow a CNS but this was not sent for sensitivity testing by the lab. She had developed an apparent PICC line associate infection and PICC was removed and I had placed her on doxycyline while she had no IV access. New PICC was placed and pt restarted on abx (vanco and ceftriaxone) and pt did not tolerate well with flushing, what sounds like red mans syndrome as well as paresthesias, numbness, shaking chills and 8 bowel movements.. She refused reinitiation of the vancomycin and she continued on doxycycline and rifampin. I was able to get her switched to IV daptomycin and she continued on this and oral rifampin.  When I last saw her she was  without fevers or malaise and with no pain in knee at rest and minimal pain with exertion.  I dc'd her rifampin at that time because she had multituide of other symptoms including paresthesias, Nausea etc that I thought might be due to this.    Her ESR and CRP had also come down nicely when I last saw her in 50  Today she states that she only has pain 2-3/10 when she starts moving and walking after prolonged rest but this then improves, no pain at rest.      Review of Systems  Constitutional: Negative for fever, chills, diaphoresis, activity change, appetite change, fatigue and unexpected weight change.  HENT: Negative for congestion, sore throat, rhinorrhea, sneezing, trouble swallowing and sinus pressure.   Eyes: Negative for photophobia and visual disturbance.  Respiratory: Negative for cough, chest tightness, shortness of breath, wheezing and stridor.   Cardiovascular: Negative for chest pain,  palpitations and leg swelling.  Gastrointestinal: Negative for nausea, vomiting, abdominal pain, diarrhea, constipation, blood in stool, abdominal distention and anal bleeding.  Genitourinary: Negative for dysuria, hematuria, flank pain and difficulty urinating.  Musculoskeletal: Negative for myalgias, back pain, joint swelling, arthralgias and gait problem.  Skin: Positive for rash. Negative for color change, pallor and wound.  Neurological: Negative for dizziness, tremors, weakness and light-headedness.  Hematological: Negative for adenopathy. Does not bruise/bleed easily.  Psychiatric/Behavioral: Positive for sleep disturbance. Negative for behavioral problems, confusion, dysphoric mood, decreased concentration and agitation. The patient is nervous/anxious.        Objective:   Physical Exam  Constitutional: She is oriented to person, place, and time. She appears well-developed and well-nourished. No distress.  HENT:  Head: Normocephalic and atraumatic.  Mouth/Throat: Oropharynx is clear and moist. No oropharyngeal exudate.  Eyes: Conjunctivae and EOM are normal. Pupils are equal, round, and reactive to light. No scleral icterus.  Neck: Normal range of motion. Neck supple. No JVD present.  Cardiovascular: Normal rate, regular rhythm and normal heart sounds.  Exam reveals no gallop and no friction rub.   No murmur heard. Pulmonary/Chest: Effort normal and breath sounds normal. No respiratory distress. She has no wheezes. She has no rales. She exhibits no tenderness.  Abdominal: She exhibits no distension and no mass. There is no tenderness. There is no rebound and no guarding.  Musculoskeletal: She exhibits no edema and no tenderness.       Legs:  Lymphadenopathy:    She has no cervical adenopathy.  Neurological: She is alert and oriented to person, place, and time. She has normal reflexes. She exhibits normal muscle tone. Coordination normal.  Skin: Skin is warm and dry. She is not  diaphoretic. No erythema. No pallor.  Psychiatric: She has a normal mood and affect. Her behavior is normal. Judgment and thought content normal.          Assessment & Plan:  Prosthetic joint infection: Finished IV cubicin now on doxy and try to finish 6 months total abx . Check esr and crp today  CNS: culprit we believe. Sensis never done, culture is gone unfortunately

## 2012-08-16 LAB — C-REACTIVE PROTEIN: CRP: 4.6 mg/dL — ABNORMAL HIGH (ref <0.60)

## 2012-10-17 ENCOUNTER — Ambulatory Visit (INDEPENDENT_AMBULATORY_CARE_PROVIDER_SITE_OTHER): Payer: BC Managed Care – PPO | Admitting: Infectious Disease

## 2012-10-17 ENCOUNTER — Encounter: Payer: Self-pay | Admitting: Infectious Disease

## 2012-10-17 VITALS — BP 120/75 | HR 93 | Temp 99.1°F | Ht 66.0 in | Wt 237.0 lb

## 2012-10-17 DIAGNOSIS — Z96659 Presence of unspecified artificial knee joint: Secondary | ICD-10-CM

## 2012-10-17 DIAGNOSIS — T8453XD Infection and inflammatory reaction due to internal right knee prosthesis, subsequent encounter: Secondary | ICD-10-CM

## 2012-10-17 DIAGNOSIS — B957 Other staphylococcus as the cause of diseases classified elsewhere: Secondary | ICD-10-CM

## 2012-10-17 DIAGNOSIS — T8453XA Infection and inflammatory reaction due to internal right knee prosthesis, initial encounter: Secondary | ICD-10-CM

## 2012-10-17 DIAGNOSIS — R7881 Bacteremia: Secondary | ICD-10-CM

## 2012-10-17 DIAGNOSIS — T8450XA Infection and inflammatory reaction due to unspecified internal joint prosthesis, initial encounter: Secondary | ICD-10-CM

## 2012-10-17 DIAGNOSIS — Z5189 Encounter for other specified aftercare: Secondary | ICD-10-CM

## 2012-10-17 MED ORDER — DOXYCYCLINE HYCLATE 100 MG PO TABS: 100.0000 mg | ORAL_TABLET | Freq: Two times a day (BID) | ORAL | Status: DC

## 2012-10-17 NOTE — Progress Notes (Signed)
Subjective:    Patient ID: Maria Holt, female    DOB: 11/26/1960, 52 y.o.   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 She was seen by my partner Dr. Ninetta Lights and sent home on empiric regimen of Vancomycin, rocephin and oral rifampin. One of the knee cultures DID actually grow a CNS but this was not sent for sensitivity testing by the lab. She had developed an apparent PICC line associate infection and PICC was removed and I had placed her on doxycyline while she had no IV access. New PICC was placed and pt restarted on abx (vanco and ceftriaxone) and pt did not tolerate well with flushing, what sounds like red mans syndrome as well as paresthesias, numbness, shaking chills and 8 bowel movements.. She refused reinitiation of the vancomycin and she continued on doxycycline and rifampin. I was able to get her switched to IV daptomycin and she continued on this and oral rifampin.  When I last saw her she was  without fevers or malaise and with no pain in knee at rest and minimal pain with exertion.  I dc'd her rifampin at that time because she had multituide of other symptoms including paresthesias, Nausea etc that I thought might be due to this.    Her ESR and CRP had also come down nicely when I last saw her in February but her numbers had worsened in May of 2014.  Today she states that she only has rare  pain 2-3/10 when she starts moving and walking after prolonged rest but this then improves, no pain at rest.    Review of Systems  Constitutional: Negative for fever, chills, diaphoresis, activity change, appetite change, fatigue and unexpected weight change.  HENT: Negative for congestion, sore throat, rhinorrhea, sneezing, trouble swallowing and sinus pressure.   Eyes: Negative for photophobia and visual disturbance.  Respiratory: Negative for cough, chest tightness, shortness of breath, wheezing and stridor.     Cardiovascular: Negative for chest pain, palpitations and leg swelling.  Gastrointestinal: Negative for nausea, vomiting, abdominal pain, diarrhea, constipation, blood in stool, abdominal distention and anal bleeding.  Genitourinary: Negative for dysuria, hematuria, flank pain and difficulty urinating.  Musculoskeletal: Negative for myalgias, back pain, joint swelling, arthralgias and gait problem.  Skin: Positive for rash. Negative for color change, pallor and wound.  Neurological: Negative for dizziness, tremors, weakness and light-headedness.  Hematological: Negative for adenopathy. Does not bruise/bleed easily.  Psychiatric/Behavioral: Positive for sleep disturbance. Negative for behavioral problems, confusion, dysphoric mood, decreased concentration and agitation. The patient is nervous/anxious.        Objective:   Physical Exam  Constitutional: She is oriented to person, place, and time. She appears well-developed and well-nourished. No distress.  HENT:  Head: Normocephalic and atraumatic.  Mouth/Throat: Oropharynx is clear and moist. No oropharyngeal exudate.  Eyes: Conjunctivae and EOM are normal. Pupils are equal, round, and reactive to light. No scleral icterus.  Neck: Normal range of motion. Neck supple. No JVD present.  Cardiovascular: Normal rate, regular rhythm and normal heart sounds.  Exam reveals no gallop and no friction rub.   No murmur heard. Pulmonary/Chest: Effort normal and breath sounds normal. No respiratory distress. She has no wheezes. She has no rales. She exhibits no tenderness.  Abdominal: She exhibits no distension and no mass. There is no tenderness. There is no rebound and no guarding.  Musculoskeletal: She exhibits no edema  and no tenderness.       Legs: Lymphadenopathy:    She has no cervical adenopathy.  Neurological: She is alert and oriented to person, place, and time. She has normal reflexes. She exhibits normal muscle tone. Coordination normal.   Skin: Skin is warm and dry. She is not diaphoretic. No erythema. No pallor.  Psychiatric: She has a normal mood and affect. Her behavior is normal. Judgment and thought content normal.          Assessment & Plan:  Prosthetic joint infection: Finished IV cubicin now on doxy and try to finish 6 months total abx , continue doxycyline, check esr and crp today, she is to see Dr. Dion Saucier on the 14th. I would like to have data back and have discussion with Dr. Dion Saucier and pt with data prior to making decision re coninuing protracted abx vs stopping them  CNS: culprit we believe. Sensis never done, culture is gone unfortunately,

## 2012-11-01 ENCOUNTER — Emergency Department (HOSPITAL_COMMUNITY)
Admission: EM | Admit: 2012-11-01 | Discharge: 2012-11-01 | Disposition: A | Discharge status: Home / Self Care | Payer: BC Managed Care – PPO | Attending: Emergency Medicine | Admitting: Emergency Medicine

## 2012-11-01 ENCOUNTER — Emergency Department (HOSPITAL_COMMUNITY): Payer: BC Managed Care – PPO

## 2012-11-01 ENCOUNTER — Encounter (HOSPITAL_COMMUNITY): Payer: Self-pay | Admitting: *Deleted

## 2012-11-01 DIAGNOSIS — E669 Obesity, unspecified: Secondary | ICD-10-CM | POA: Insufficient documentation

## 2012-11-01 DIAGNOSIS — I10 Essential (primary) hypertension: Secondary | ICD-10-CM | POA: Insufficient documentation

## 2012-11-01 DIAGNOSIS — R197 Diarrhea, unspecified: Secondary | ICD-10-CM | POA: Insufficient documentation

## 2012-11-01 DIAGNOSIS — Z79899 Other long term (current) drug therapy: Secondary | ICD-10-CM | POA: Insufficient documentation

## 2012-11-01 DIAGNOSIS — R1110 Vomiting, unspecified: Secondary | ICD-10-CM | POA: Insufficient documentation

## 2012-11-01 DIAGNOSIS — Z8739 Personal history of other diseases of the musculoskeletal system and connective tissue: Secondary | ICD-10-CM | POA: Insufficient documentation

## 2012-11-01 DIAGNOSIS — G4733 Obstructive sleep apnea (adult) (pediatric): Secondary | ICD-10-CM | POA: Insufficient documentation

## 2012-11-01 DIAGNOSIS — R109 Unspecified abdominal pain: Secondary | ICD-10-CM | POA: Insufficient documentation

## 2012-11-01 DIAGNOSIS — Z8619 Personal history of other infectious and parasitic diseases: Secondary | ICD-10-CM | POA: Insufficient documentation

## 2012-11-01 DIAGNOSIS — G8929 Other chronic pain: Secondary | ICD-10-CM | POA: Insufficient documentation

## 2012-11-01 DIAGNOSIS — K219 Gastro-esophageal reflux disease without esophagitis: Secondary | ICD-10-CM | POA: Insufficient documentation

## 2012-11-01 LAB — CBC WITH DIFFERENTIAL/PLATELET
Basophils Absolute: 0 10*3/uL (ref 0.0–0.1)
HCT: 35.9 % — ABNORMAL LOW (ref 36.0–46.0)
Hemoglobin: 11.3 g/dL — ABNORMAL LOW (ref 12.0–15.0)
Lymphocytes Relative: 11 % — ABNORMAL LOW (ref 12–46)
Monocytes Absolute: 0.6 10*3/uL (ref 0.1–1.0)
Monocytes Relative: 6 % (ref 3–12)
Neutro Abs: 8.7 10*3/uL — ABNORMAL HIGH (ref 1.7–7.7)
RBC: 4.81 MIL/uL (ref 3.87–5.11)
WBC: 10.5 10*3/uL (ref 4.0–10.5)

## 2012-11-01 LAB — URINALYSIS, ROUTINE W REFLEX MICROSCOPIC
Glucose, UA: NEGATIVE mg/dL
pH: 6 (ref 5.0–8.0)

## 2012-11-01 LAB — COMPREHENSIVE METABOLIC PANEL
AST: 11 U/L (ref 0–37)
CO2: 27 mEq/L (ref 19–32)
Chloride: 98 mEq/L (ref 96–112)
Creatinine, Ser: 0.68 mg/dL (ref 0.50–1.10)
GFR calc non Af Amer: >90 mL/min (ref >90)
Total Bilirubin: 0.4 mg/dL (ref 0.3–1.2)

## 2012-11-01 LAB — URINE MICROSCOPIC-ADD ON

## 2012-11-01 LAB — LIPASE, BLOOD: Lipase: 18 U/L (ref 11–59)

## 2012-11-01 MED ORDER — SODIUM CHLORIDE 0.9 % IV SOLN
INTRAVENOUS | Status: DC
  Administered 2012-11-01: 1000 mL via INTRAVENOUS

## 2012-11-01 MED ORDER — FENTANYL CITRATE 0.05 MG/ML IJ SOLN
100.0000 ug | Freq: Once | INTRAMUSCULAR | Status: AC
  Administered 2012-11-01: 100 ug via INTRAVENOUS
  Filled 2012-11-01: qty 2

## 2012-11-01 MED ORDER — IOHEXOL 300 MG/ML  SOLN
100.0000 mL | Freq: Once | INTRAMUSCULAR | Status: AC | PRN
  Administered 2012-11-01: 100 mL via INTRAVENOUS

## 2012-11-01 MED ORDER — OXYCODONE-ACETAMINOPHEN 5-325 MG PO TABS: 1.0000 | ORAL_TABLET | ORAL | Status: DC | PRN

## 2012-11-01 MED ORDER — SODIUM CHLORIDE 0.9 % IV BOLUS (SEPSIS)
1000.0000 mL | Freq: Once | INTRAVENOUS | Status: AC
  Administered 2012-11-01: 1000 mL via INTRAVENOUS

## 2012-11-01 MED ORDER — METOCLOPRAMIDE HCL 5 MG/ML IJ SOLN
10.0000 mg | Freq: Once | INTRAMUSCULAR | Status: AC
  Administered 2012-11-01: 10 mg via INTRAVENOUS
  Filled 2012-11-01: qty 2

## 2012-11-01 MED ORDER — HYDROMORPHONE HCL PF 1 MG/ML IJ SOLN: 1.0000 mg | Freq: Once | INTRAMUSCULAR | Status: DC

## 2012-11-01 MED ORDER — HYDROMORPHONE HCL PF 1 MG/ML IJ SOLN
INTRAMUSCULAR | Status: AC
  Filled 2012-11-01: qty 1

## 2012-11-01 MED ORDER — ONDANSETRON HCL 4 MG/2ML IJ SOLN
4.0000 mg | Freq: Once | INTRAMUSCULAR | Status: AC
  Administered 2012-11-01: 4 mg via INTRAVENOUS
  Filled 2012-11-01: qty 2

## 2012-11-01 MED ORDER — IOHEXOL 300 MG/ML  SOLN
50.0000 mL | Freq: Once | INTRAMUSCULAR | Status: AC | PRN
  Administered 2012-11-01: 50 mL via ORAL

## 2012-11-01 MED ORDER — FENTANYL CITRATE 0.05 MG/ML IJ SOLN
INTRAMUSCULAR | Status: AC
  Administered 2012-11-01: 100 ug via INTRAVENOUS
  Filled 2012-11-01: qty 2

## 2012-11-01 MED ORDER — FENTANYL CITRATE 0.05 MG/ML IJ SOLN: 100.0000 ug | Freq: Once | INTRAMUSCULAR | Status: AC

## 2012-11-01 MED ORDER — FENTANYL CITRATE 0.05 MG/ML IJ SOLN
50.0000 ug | Freq: Once | INTRAMUSCULAR | Status: AC
  Administered 2012-11-01: 50 ug via INTRAVENOUS
  Filled 2012-11-01: qty 2

## 2012-11-01 MED ORDER — ALUM & MAG HYDROXIDE-SIMETH 200-200-20 MG/5ML PO SUSP
30.0000 mL | Freq: Once | ORAL | Status: AC
  Administered 2012-11-01: 30 mL via ORAL
  Filled 2012-11-01: qty 30

## 2012-11-01 MED ORDER — OXYCODONE-ACETAMINOPHEN 5-325 MG PO TABS
2.0000 | ORAL_TABLET | Freq: Once | ORAL | Status: AC
  Administered 2012-11-01: 2 via ORAL
  Filled 2012-11-01: qty 2

## 2012-11-01 NOTE — ED Notes (Signed)
Discharge instructions given and reviewed with a patient.  Prescription given for Percocet; effects and use explained.  Patient verbalized understanding to take medication as directed and to advance diet as tolerated.  Patient discharged home in good condition.

## 2012-11-01 NOTE — ED Notes (Signed)
Lower abdominal pain since afternoon of Wednesday.  Diarrhea unknown times since then.  Vomited x 1. Has been on doxycycline consistently since February for post op knee surgery.

## 2012-11-01 NOTE — ED Notes (Signed)
Family at bedside. Patient states that her pain is around a three with the help of medication.

## 2012-11-01 NOTE — ED Provider Notes (Signed)
History  This chart was scribed for Donnetta Hutching, MD, by Yevette Edwards, ED Scribe. This patient was seen in room APA08/[facility]08 and the patient's care was started at 12:38 PM.  CSN: [id number] Arrival date & time 11/01/12  1112  First MD Initiated Contact with Patient 11/01/12 1147     Chief Complaint  Patient presents with  . Abdominal Pain    The history is provided by the patient. No language interpreter was used.   HPI Comments: Maria Holt is a 52 y.o. female who presents to the Emergency Department complaining of bilateral, midsectional abdominal pain which began five days ago while she was at work. She states she has experienced abdominal cramps, diarrhea, and emesis as associated symptoms.  She reports having experienced emesis only once, multiple bouts of diarrhea, and increasingly worsening abdominal cramps. The pt denies any h/o of abdominal surgeries. She also denies any h/o of DM.  The pt has a h/o of HTN. She also has a h/o of I  & D right knee with poly exchange for which she currently takes doxycycline. She denies smoking or using alcohol.   Drs. Landau and Wayner performed the pt's knee surgery. Dr. Phillips Odor is the pt's PCP. Past Medical History  Diagnosis Date  . GERD (gastroesophageal reflux disease)   . Hypertension   . Chronic lower back pain   . Post-traumatic osteoarthritis of right knee 1989    "tore ACL; failed reconstruction" (03/19/2012)  . OSA on CPAP 2005    CPAP-rarely uses  . De Quervain's disease (tenosynovitis)     "left hand" (03/19/2012)  . 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 Anterior cruciate ligament repair  1989    "right" (03/19/2012)  . Knee arthroscopy  07/2001    RIGHT  . Anterior cervical decomp/discectomy fusion  01/2001  . Total knee arthroplasty  03/19/2012    "right" (03/19/2012)  . Total knee arthroplasty  03/19/2012    Procedure: TOTAL KNEE ARTHROPLASTY;  Surgeon:  Eulas Post, MD;  Location: [facility] OR;  Service: Orthopedics;  Laterality: Right;  . Back surgery      C 5-6 fusion  . I&d knee with poly exchange  04/19/2012    Procedure: IRRIGATION AND DEBRIDEMENT KNEE WITH POLY EXCHANGE;  Surgeon: Eulas Post, MD;  Location: [facility] OR;  Service: Orthopedics;  Laterality: Right;  irrigation and debridement right knee arthroplasty with poly exhange   Family History  Problem Relation Age of Onset  . Coronary artery disease Father    History  Substance Use Topics  . Smoking status: Never Smoker   . Smokeless tobacco: Never Used  . Alcohol Use: No   No OB history provided.  Review of Systems  A complete 10 system review of systems was obtained, and all systems were negative except where indicated in the HPI and PE.   Allergies  Dilaudid; Demerol; Morphine and related; Hibiclens; and Sulfa antibiotics  Home Medications   Current Outpatient Rx  Name  Route  Sig  Dispense  Refill  . calcium carbonate (OS-CAL) 600 MG TABS   Oral   Take 1,200 mg by mouth daily.         Marland Kitchen doxycycline (VIBRA-TABS) 100 MG tablet   Oral   Take 1 tablet (100 mg total) by mouth 2 (two) times daily.   60 tablet   4   . fluconazole (DIFLUCAN) 100 MG tablet   Oral  Take 1 tablet (100 mg total) by mouth daily.   10 tablet   5   . lansoprazole (PREVACID) 15 MG capsule   Oral   Take 15 mg by mouth daily.         Marland Kitchen lisinopril-hydrochlorothiazide (PRINZIDE,ZESTORETIC) 10-12.5 MG per tablet   Oral   Take 1 tablet by mouth daily.         . methocarbamol (ROBAXIN) 500 MG tablet   Oral   Take 1 tablet (500 mg total) by mouth 4 (four) times daily.   75 tablet   1   . Multiple Vitamin (MULTIVITAMIN WITH MINERALS) TABS   Oral   Take 1 tablet by mouth daily.         Marland Kitchen oxyCODONE-acetaminophen (PERCOCET) 10-325 MG per tablet   Oral   Take 1-2 tablets by mouth every 6 (six) hours as needed for pain. MAXIMUM TOTAL ACETAMINOPHEN DOSE IS 4000 MG PER DAY   75  tablet   0   . triamcinolone cream (KENALOG) 0.1 %   Topical   Apply 1 application topically 2 (two) times daily as needed. For eczema          Triage Vitals: BP 134/116  Pulse 98  Temp(Src) 99.7 F (37.6 C) (Oral)  Resp 19  Ht 5\' 6"  (1.676 m)  Wt 238 lb (107.956 kg)  BMI 38.43 kg/m2  SpO2 99%  LMP 10/14/2012  Physical Exam  Nursing note and vitals reviewed. Constitutional: She is oriented to person, place, and time. She appears well-developed and well-nourished.  Obese.  HENT:  Head: Normocephalic and atraumatic.  Eyes: Conjunctivae and EOM are normal. Pupils are equal, round, and reactive to light.  Neck: Normal range of motion. Neck supple.  Cardiovascular: Normal rate, regular rhythm and normal heart sounds.   Pulmonary/Chest: Effort normal and breath sounds normal.  Abdominal: Soft. Bowel sounds are normal. There is tenderness.  Mild tenderness to general mid-abdominal region.  Musculoskeletal: Normal range of motion.  Neurological: She is alert and oriented to person, place, and time.  Skin: Skin is warm and dry.  Psychiatric: She has a normal mood and affect.    ED Course  Procedures (including critical care time)  DIAGNOSTIC STUDIES:  Oxygen Saturation is 99% on room air, normal by my interpretation.    COORDINATION OF CARE:  12:40 PM- Discussed treatment plan with patient which includes labs, a urine analysis, pain medication, and IV fluids. The patient agreed to the plan.   Labs Reviewed  URINALYSIS, ROUTINE W REFLEX MICROSCOPIC - Abnormal; Notable for the following:    Color, Urine AMBER (*)    APPearance CLOUDY (*)    Specific Gravity, Urine >1.030 (*)    Hgb urine dipstick LARGE (*)    Bilirubin Urine MODERATE (*)    Ketones, ur >80 (*)    Protein, ur 30 (*)    Leukocytes, UA SMALL (*)    All other components within normal limits  CBC WITH DIFFERENTIAL - Abnormal; Notable for the following:    Hemoglobin 11.3 (*)    HCT 35.9 (*)    MCV 74.6  (*)    MCH 23.5 (*)    RDW 16.4 (*)    Neutrophils Relative % 83 (*)    Neutro Abs 8.7 (*)    Lymphocytes Relative 11 (*)    All other components within normal limits  COMPREHENSIVE METABOLIC PANEL - Abnormal; Notable for the following:    Glucose, Bld 117 (*)    Albumin 3.4 (*)  All other components within normal limits  URINE MICROSCOPIC-ADD ON - Abnormal; Notable for the following:    Squamous Epithelial / LPF FEW (*)    Bacteria, UA MANY (*)    All other components within normal limits  URINE CULTURE  LIPASE, BLOOD   No results found. No diagnosis found.  MDM  Persistent abdominal pain and cramping not improving with fluids and pain management.  CT abd/pevis pending.  Discussed with Dr Effie Shy.   I personally performed the services described in this documentation, which was scribed in my presence. The recorded information has been reviewed and is accurate.    Donnetta Hutching, MD 11/01/12 [phone]

## 2012-11-01 NOTE — ED Notes (Signed)
Patient requesting something for indigestion. Dr Effie Shy made aware, verbal order given.

## 2012-11-03 LAB — URINE CULTURE

## 2012-11-05 LAB — STOOL CULTURE

## 2012-11-28 ENCOUNTER — Other Ambulatory Visit: Payer: Self-pay | Admitting: *Deleted

## 2012-11-28 DIAGNOSIS — B379 Candidiasis, unspecified: Secondary | ICD-10-CM

## 2012-11-28 MED ORDER — FLUCONAZOLE 100 MG PO TABS: 100.0000 mg | ORAL_TABLET | Freq: Every day | ORAL | Status: DC

## 2012-12-19 ENCOUNTER — Encounter: Payer: Self-pay | Admitting: Infectious Disease

## 2012-12-19 ENCOUNTER — Ambulatory Visit (INDEPENDENT_AMBULATORY_CARE_PROVIDER_SITE_OTHER): Payer: BC Managed Care – PPO | Admitting: Infectious Disease

## 2012-12-19 VITALS — BP 119/72 | HR 97 | Temp 98.7°F | Ht 66.0 in | Wt 239.0 lb

## 2012-12-19 DIAGNOSIS — T8453XS Infection and inflammatory reaction due to internal right knee prosthesis, sequela: Secondary | ICD-10-CM

## 2012-12-19 DIAGNOSIS — Z96659 Presence of unspecified artificial knee joint: Secondary | ICD-10-CM

## 2012-12-19 DIAGNOSIS — R7881 Bacteremia: Secondary | ICD-10-CM

## 2012-12-19 DIAGNOSIS — B957 Other staphylococcus as the cause of diseases classified elsewhere: Secondary | ICD-10-CM

## 2012-12-19 DIAGNOSIS — T8450XA Infection and inflammatory reaction due to unspecified internal joint prosthesis, initial encounter: Secondary | ICD-10-CM

## 2012-12-19 DIAGNOSIS — T8453XA Infection and inflammatory reaction due to internal right knee prosthesis, initial encounter: Secondary | ICD-10-CM

## 2012-12-19 DIAGNOSIS — T889XXS Complication of surgical and medical care, unspecified, sequela: Secondary | ICD-10-CM

## 2012-12-19 MED ORDER — DOXYCYCLINE HYCLATE 100 MG PO TABS: 100.0000 mg | ORAL_TABLET | Freq: Two times a day (BID) | ORAL | Status: DC

## 2012-12-19 NOTE — Progress Notes (Signed)
Subjective:    Patient ID: Maria Holt, female    DOB: 12-03-1960, 52 y.o.   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 She was seen by my partner Dr. Ninetta Lights and sent home on empiric regimen of Vancomycin, rocephin and oral rifampin. One of the knee cultures DID actually grow a CNS but this was not sent for sensitivity testing by the lab. She had developed an apparent PICC line associate infection and PICC was removed and I had placed her on doxycyline while she had no IV access. New PICC was placed and pt restarted on abx (vanco and ceftriaxone) and pt did not tolerate well with flushing, what sounds like red mans syndrome as well as paresthesias, numbness, shaking chills and 8 bowel movements.. She refused reinitiation of the vancomycin and she continued on doxycycline and rifampin. I was able to get her switched to IV daptomycin and she continued on this and oral rifampin.  When I last saw her she was  without fevers or malaise and with no pain in knee at rest and minimal pain with exertion.  I dc'd her rifampin at that time because she had multituide of other symptoms including paresthesias, Nausea etc that I thought might be due to this.    Her ESR and CRP had also come down nicely when I last saw her in February but her numbers had worsened in May of 2014, and then again improved when last checked in July.  Today she states that she only has rare  pain 2-3/10 when she starts moving and walking after prolonged rest but this then improves, no pain at rest. She is without fevers chills nausea or malaise. She does continue to take doxycycline at present.   Review of Systems  Constitutional: Negative for fever, chills, diaphoresis, activity change, appetite change, fatigue and unexpected weight change.  HENT: Negative for congestion, sore throat, rhinorrhea, sneezing, trouble swallowing and sinus pressure.     Eyes: Negative for photophobia and visual disturbance.  Respiratory: Negative for cough, chest tightness, shortness of breath, wheezing and stridor.   Cardiovascular: Negative for chest pain, palpitations and leg swelling.  Gastrointestinal: Negative for nausea, vomiting, abdominal pain, diarrhea, constipation, blood in stool, abdominal distention and anal bleeding.  Genitourinary: Negative for dysuria, hematuria, flank pain and difficulty urinating.  Musculoskeletal: Negative for myalgias, back pain, joint swelling, arthralgias and gait problem.  Skin: Positive for rash. Negative for color change, pallor and wound.  Neurological: Negative for dizziness, tremors, weakness and light-headedness.  Hematological: Negative for adenopathy. Does not bruise/bleed easily.  Psychiatric/Behavioral: Positive for sleep disturbance. Negative for behavioral problems, confusion, dysphoric mood, decreased concentration and agitation. The patient is nervous/anxious.        Objective:   Physical Exam  Constitutional: She is oriented to person, place, and time. She appears well-developed and well-nourished. No distress.  HENT:  Head: Normocephalic and atraumatic.  Mouth/Throat: Oropharynx is clear and moist. No oropharyngeal exudate.  Eyes: Conjunctivae and EOM are normal. Pupils are equal, round, and reactive to light. No scleral icterus.  Neck: Normal range of motion. Neck supple. No JVD present.  Cardiovascular: Normal rate, regular rhythm and normal heart sounds.  Exam reveals no gallop and no friction rub.   No murmur heard. Pulmonary/Chest: Effort normal and breath sounds normal. No respiratory distress. She has no wheezes. She has no rales. She exhibits no tenderness.  Abdominal:  She exhibits no distension and no mass. There is no tenderness. There is no rebound and no guarding.  Musculoskeletal: She exhibits no edema and no tenderness.       Legs: Lymphadenopathy:    She has no cervical adenopathy.   Neurological: She is alert and oriented to person, place, and time. She has normal reflexes. She exhibits normal muscle tone. Coordination normal.  Skin: Skin is warm and dry. She is not diaphoretic. No erythema. No pallor.  Psychiatric: She has a normal mood and affect. Her behavior is normal. Judgment and thought content normal.          Assessment & Plan:  Prosthetic joint infection: Finished IV cubicin now on doxycycline having finished nearly 8 months of postoperative antibiotics. check esr and crp today, if these are encouraging and downtrending she may stop her doxycycline and followup with me in 3 months time.    CNS: culprit we believe. Sensis never done, culture is gone unfortunately,

## 2012-12-25 ENCOUNTER — Telehealth: Payer: Self-pay | Admitting: *Deleted

## 2012-12-25 NOTE — Telephone Encounter (Signed)
Pt reporting low grade fevers returning.  Wondering about Sed Rate and CRP results and whether IV antibiotics need to be continued.  MD please notify pt and home health agency.

## 2013-01-03 ENCOUNTER — Telehealth: Payer: Self-pay | Admitting: *Deleted

## 2013-01-03 NOTE — Telephone Encounter (Signed)
Patient called and advised that Dr Daiva Eves left her a message asking if she was still taking her antibiotic. She advised she is and she has an appt to see him in 03/2013 but if he needs to see her sooner we can just call her back.

## 2013-01-03 NOTE — Telephone Encounter (Signed)
I think 12 is fine, we could bring her back in November if she would prefer?

## 2013-01-20 ENCOUNTER — Telehealth: Payer: Self-pay | Admitting: *Deleted

## 2013-01-20 NOTE — Telephone Encounter (Signed)
Pt called wanting to schedule a her colonoscopy. please advise [phone].

## 2013-01-21 ENCOUNTER — Other Ambulatory Visit (HOSPITAL_COMMUNITY): Payer: Self-pay | Admitting: Family Medicine

## 2013-01-21 ENCOUNTER — Other Ambulatory Visit: Payer: Self-pay

## 2013-01-21 DIAGNOSIS — Z1211 Encounter for screening for malignant neoplasm of colon: Secondary | ICD-10-CM

## 2013-01-21 DIAGNOSIS — Z139 Encounter for screening, unspecified: Secondary | ICD-10-CM

## 2013-01-21 NOTE — Telephone Encounter (Signed)
Gastroenterology Pre-Procedure Review  Request Date: 01/20/2013 Requesting Physician: Dr. Phillips Odor  PATIENT REVIEW QUESTIONS: The patient responded to the following health history questions as indicated:    1. Diabetes Melitis: no 2. Joint replacements in the past 12 months: YES    Right knee replacement in 03/2012 / then infection and another surg 04/19/2012 3. Major health problems in the past 3 months: no 4. Has an artificial valve or MVP: no 5. Has a defibrillator: no 6. Has been advised in past to take antibiotics in advance of a procedure like teeth cleaning:  yes    MEDICATIONS & ALLERGIES:    Patient reports the following regarding taking any blood thinners:   Plavix? no Aspirin? no Coumadin? no  Patient confirms/reports the following medications:  Current Outpatient Prescriptions  Medication Sig Dispense Refill  . calcium carbonate (OS-CAL) 600 MG TABS Take 1,200 mg by mouth daily.      Marland Kitchen doxycycline (VIBRA-TABS) 100 MG tablet Take 1 tablet (100 mg total) by mouth 2 (two) times daily.  60 tablet  4  . ferrous sulfate 325 (65 FE) MG tablet Take 325 mg by mouth daily with breakfast.      . fish oil-omega-3 fatty acids 1000 MG capsule Take 1 g by mouth daily.      Marland Kitchen ibuprofen (ADVIL,MOTRIN) 200 MG tablet Take 200 mg by mouth 2 (two) times daily.      . lansoprazole (PREVACID) 15 MG capsule Take 15 mg by mouth daily.      Marland Kitchen lisinopril-hydrochlorothiazide (PRINZIDE,ZESTORETIC) 10-12.5 MG per tablet Take 1 tablet by mouth daily.      . Multiple Vitamin (MULTIVITAMIN WITH MINERALS) TABS Take 1 tablet by mouth daily.      . Probiotic Product (PROBIOTIC DAILY PO) Take 1 capsule by mouth daily.      Marland Kitchen triamcinolone cream (KENALOG) 0.1 % Apply 1 application topically as needed (Dry Skin). For eczema       No current facility-administered medications for this visit.    Patient confirms/reports the following allergies:  Allergies  Allergen Reactions  . Dilaudid [Hydromorphone Hcl]  Hives and Other (See Comments)    "I don't remember the reaction; just know it was a no no" (03/19/2012)  . Demerol [Meperidine] Hives  . Morphine And Related Hives  . Hibiclens [Chlorhexidine Gluconate]     CHG wipes make her itch  . Sulfa Antibiotics Other (See Comments)    "don't remember the reaction; Dr. just told me I was allergic" (03/19/2012)    No orders of the defined types were placed in this encounter.    AUTHORIZATION INFORMATION Primary Insurance:   ID #:   Group #:  Pre-Cert / Auth required: Pre-Cert / Auth #:   Secondary Insurance:   ID #:   Group #:  Pre-Cert / Auth required: Pre-Cert / Auth #:   SCHEDULE INFORMATION: Procedure has been scheduled as follows:  Date: 02/03/2013               Time:  11:45 AM Location: [facility] Short Stay  This Gastroenterology Pre-Precedure Review Form is being routed to the following provider(s): Jonette Eva, MD

## 2013-01-29 MED ORDER — PEG-KCL-NACL-NASULF-NA ASC-C 100 G PO SOLR: 1.0000 | ORAL | Status: DC

## 2013-01-29 NOTE — Addendum Note (Signed)
Addended by: Lavena Bullion on: 01/29/2013 09:35 AM   Modules accepted: Orders

## 2013-01-29 NOTE — Telephone Encounter (Signed)
Faxed the instructions to the pt at her work per her request at [phone]. ( her work phone is [phone]). She will call me if she does not receive it. Rx sent to the pharmacy.

## 2013-01-29 NOTE — Telephone Encounter (Signed)
MOVI PREP SPLIT DOSING, REGULAR BREAKFAST. CLEAR LIQUIDS AFTER 9 AM.  HOLD IRON FOR 7 DAYS.

## 2013-01-29 NOTE — Telephone Encounter (Signed)
I spoke to pt on 01/28/2013 and she is aware to hold iron for 7 days, and actually stopped taking it on Sat 01/25/2013.

## 2013-01-31 ENCOUNTER — Encounter (HOSPITAL_COMMUNITY): Payer: Self-pay

## 2013-02-03 ENCOUNTER — Encounter (HOSPITAL_COMMUNITY)
Admission: RE | Disposition: A | Discharge status: Home / Self Care | Payer: Self-pay | Source: Ambulatory Visit | Attending: Gastroenterology

## 2013-02-03 ENCOUNTER — Encounter (HOSPITAL_COMMUNITY): Payer: Self-pay | Admitting: *Deleted

## 2013-02-03 ENCOUNTER — Ambulatory Visit (HOSPITAL_COMMUNITY)
Admission: RE | Admit: 2013-02-03 | Discharge: 2013-02-03 | Disposition: A | Discharge status: Home / Self Care | Payer: BC Managed Care – PPO | Source: Ambulatory Visit | Attending: Gastroenterology | Admitting: Gastroenterology

## 2013-02-03 DIAGNOSIS — K648 Other hemorrhoids: Secondary | ICD-10-CM | POA: Insufficient documentation

## 2013-02-03 DIAGNOSIS — K5730 Diverticulosis of large intestine without perforation or abscess without bleeding: Secondary | ICD-10-CM

## 2013-02-03 DIAGNOSIS — I10 Essential (primary) hypertension: Secondary | ICD-10-CM | POA: Insufficient documentation

## 2013-02-03 DIAGNOSIS — Z1211 Encounter for screening for malignant neoplasm of colon: Secondary | ICD-10-CM

## 2013-02-03 HISTORY — PX: COLONOSCOPY: SHX5424

## 2013-02-03 SURGERY — COLONOSCOPY
Anesthesia: Moderate Sedation

## 2013-02-03 MED ORDER — SODIUM CHLORIDE 0.9 % IJ SOLN
INTRAMUSCULAR | Status: AC
  Filled 2013-02-03: qty 10

## 2013-02-03 MED ORDER — FENTANYL CITRATE 0.05 MG/ML IJ SOLN
INTRAMUSCULAR | Status: AC
  Filled 2013-02-03: qty 2

## 2013-02-03 MED ORDER — STERILE WATER FOR IRRIGATION IR SOLN
Status: DC | PRN
  Administered 2013-02-03: 12:00:00

## 2013-02-03 MED ORDER — PROMETHAZINE HCL 25 MG/ML IJ SOLN
INTRAMUSCULAR | Status: AC
  Filled 2013-02-03: qty 1

## 2013-02-03 MED ORDER — FENTANYL CITRATE 0.05 MG/ML IJ SOLN
INTRAMUSCULAR | Status: DC | PRN
  Administered 2013-02-03 (×3): 25 ug via INTRAVENOUS

## 2013-02-03 MED ORDER — SODIUM CHLORIDE 0.9 % IV SOLN
INTRAVENOUS | Status: DC
  Administered 2013-02-03: 11:00:00 via INTRAVENOUS

## 2013-02-03 MED ORDER — MIDAZOLAM HCL 5 MG/5ML IJ SOLN
INTRAMUSCULAR | Status: DC | PRN
  Administered 2013-02-03: 1 mg via INTRAVENOUS
  Administered 2013-02-03 (×3): 2 mg via INTRAVENOUS

## 2013-02-03 MED ORDER — MIDAZOLAM HCL 5 MG/5ML IJ SOLN
INTRAMUSCULAR | Status: AC
  Filled 2013-02-03: qty 10

## 2013-02-03 NOTE — H&P (Signed)
Primary Care Physician:  Colette Ribas, MD Primary Gastroenterologist:  Dr. Darrick Penna  Pre-Procedure History & Physical: HPI:  Maria Holt is a 52 y.o. female here for COLON CANCER SCREENING.  Past Medical History  Diagnosis Date  . GERD (gastroesophageal reflux disease)   . Hypertension   . Chronic lower back pain   . Post-traumatic osteoarthritis of right knee 1989    "tore ACL; failed reconstruction" (03/19/2012)  . OSA on CPAP 2005    CPAP-rarely uses  . De Quervain's disease (tenosynovitis)     "left hand" (03/19/2012)  . 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 Anterior cruciate ligament repair  1989    "right" (03/19/2012)  . Knee arthroscopy  07/2001    RIGHT  . Anterior cervical decomp/discectomy fusion  01/2001  . Total knee arthroplasty  03/19/2012    "right" (03/19/2012)  . Total knee arthroplasty  03/19/2012    Procedure: TOTAL KNEE ARTHROPLASTY;  Surgeon: Eulas Post, MD;  Location: [facility] OR;  Service: Orthopedics;  Laterality: Right;  . Back surgery      C 5-6 fusion  . I&d knee with poly exchange  04/19/2012    Procedure: IRRIGATION AND DEBRIDEMENT KNEE WITH POLY EXCHANGE;  Surgeon: Eulas Post, MD;  Location: [facility] OR;  Service: Orthopedics;  Laterality: Right;  irrigation and debridement right knee arthroplasty with poly exhange   Prior to Admission medications   Medication Sig Start Date End Date Taking? Authorizing Provider  calcium carbonate (OS-CAL) 600 MG TABS Take 1,200 mg by mouth daily.   Yes Historical Provider, MD  doxycycline (VIBRA-TABS) 100 MG tablet Take 1 tablet (100 mg total) by mouth 2 (two) times daily. 12/19/12  Yes Randall Hiss, MD  fish oil-omega-3 fatty acids 1000 MG capsule Take 1 g by mouth daily.   Yes Historical Provider, MD  ibuprofen (ADVIL,MOTRIN) 200 MG tablet Take 400 mg by mouth daily.    Yes Historical Provider, MD  lansoprazole (PREVACID) 15 MG capsule Take  15 mg by mouth daily.   Yes Historical Provider, MD  lisinopril-hydrochlorothiazide (PRINZIDE,ZESTORETIC) 10-12.5 MG per tablet Take 1 tablet by mouth daily.   Yes Historical Provider, MD  Multiple Vitamin (MULTIVITAMIN WITH MINERALS) TABS Take 1 tablet by mouth daily.   Yes Historical Provider, MD  peg 3350 powder (MOVIPREP) 100 G SOLR Take 1 kit (200 g total) by mouth as directed. 01/29/13  Yes West Bali, MD  Probiotic Product (PROBIOTIC DAILY PO) Take 1 capsule by mouth daily.   Yes Historical Provider, MD  triamcinolone cream (KENALOG) 0.1 % Apply 1 application topically as needed (Dry Skin). For eczema   Yes Historical Provider, MD  ferrous sulfate 325 (65 FE) MG tablet Take 325 mg by mouth daily with breakfast.    Historical Provider, MD    Allergies as of 01/21/2013 - Review Complete 01/21/2013  Allergen Reaction Noted  . Dilaudid [hydromorphone hcl] Hives and Other (See Comments) 03/06/2012  . Demerol [meperidine] Hives 03/06/2012  . Morphine and related Hives 03/06/2012  . Hibiclens [chlorhexidine gluconate]  04/19/2012  . Sulfa antibiotics Other (See Comments) 03/06/2012    Family History  Problem Relation Age of Onset  . Coronary artery disease Father     History   Social History  . Marital Status: Married    Spouse Name: N/A    Number of Children: N/A  . Years of Education: N/A   Occupational History  .  Not on file.   Social History Main Topics  . Smoking status: Never Smoker   . Smokeless tobacco: Never Used  . Alcohol Use: No  . Drug Use: No  . Sexual Activity: Yes   Other Topics Concern  . Not on file   Social History Narrative  . No narrative on file    Review of Systems: See HPI, otherwise negative ROS   Physical Exam: BP 134/85  Pulse 84  Temp(Src) 98.4 F (36.9 C) (Oral)  Resp 18  SpO2 99%  LMP 02/03/2013 General:   Alert,  pleasant and cooperative in NAD Head:  Normocephalic and atraumatic. Neck:  Supple; Lungs:  Clear throughout  to auscultation.    Heart:  Regular rate and rhythm. Abdomen:  Soft, nontender and nondistended. Normal bowel sounds, without guarding, and without rebound.   Neurologic:  Alert and  oriented x4;  grossly normal neurologically.  Impression/Plan:     SCREENING  Plan:  1. TCS TODAY

## 2013-02-03 NOTE — Op Note (Addendum)
[facility] [address] [place], [address]   COLONOSCOPY PROCEDURE REPORT  PATIENT: Maria Holt, Maria Holt  MR#: [id number] BIRTHDATE: 11-Feb-1961 , 52  yrs. old GENDER: Female ENDOSCOPIST: Jonette Eva, MD REFERRED NW:GNFA Phillips Odor, M.D. PROCEDURE DATE:  02/03/2013 PROCEDURE:   Colonoscopy, screening INDICATIONS:Average risk patient for colon cancer. MEDICATIONS: Versed 7 mg IV and Fentanyl 75 mcg IV  DESCRIPTION OF PROCEDURE:    Physical exam was performed.  Informed consent was obtained from the patient after explaining the benefits, risks, and alternatives to procedure.  The patient was connected to monitor and placed in left lateral position. Continuous oxygen was provided by nasal cannula and IV medicine administered through an indwelling cannula.  After administration of sedation and rectal exam, the patients rectum was intubated and the EC-3890Li ([id number])  colonoscope was advanced under direct visualization to the ileum.  The scope was removed slowly by carefully examining the color, texture, anatomy, and integrity mucosa on the way out.  The patient was recovered in endoscopy and discharged home in satisfactory condition.    COLON FINDINGS: The mucosa appeared normal in the terminal ileum.  , There was mild diverticulosis noted in the sigmoid colon with associated muscular hypertrophy.  , and Moderate sized internal hemorrhoids were found.  PREP QUALITY: good  CECAL W/D TIME: 13 minutes     COMPLICATIONS: None  ENDOSCOPIC IMPRESSION: 1.   Normal mucosa in the terminal ileum 2.   Mild diverticulosis in the sigmoid colon 3.   Moderate sized internal hemorrhoids  RECOMMENDATIONS: CONTINUE WEIGHT LOSS EFFORTS. Follow a HIGH FIBER/LOW FAT DIET.  AVOID ITEMS THAT CAUSE BLOATING.  USE PREPARATION H FOUR TIMES A DAY FOR 7 DAYS for RECTAL BLEEDING, PAIN, PRESSURE, OR ITCHING.  Next colonoscopy in 10  years.       _______________________________ Rosalie DoctorJonette Eva, MD 02/03/2013 5:12 PM Revised: 02/03/2013 5:12 PM

## 2013-02-05 ENCOUNTER — Encounter (HOSPITAL_COMMUNITY): Payer: Self-pay | Admitting: Gastroenterology

## 2013-02-05 ENCOUNTER — Telehealth: Payer: Self-pay | Admitting: *Deleted

## 2013-02-05 NOTE — Telephone Encounter (Signed)
I called pt and she said she did not call this morning. She is not having any problems. The phone number belongs to another pt that I had a call from this morning and I have taken care of her.

## 2013-02-05 NOTE — Telephone Encounter (Signed)
Pt called stating she is having pain down left side under rib cage, pt states Dr. Darrick Penna took some tissue on her last procedure pt is also having diarrhea, pt wants to know if that is coming from the tissue removal. Please advise [phone]

## 2013-02-20 ENCOUNTER — Other Ambulatory Visit: Payer: Self-pay

## 2013-03-03 ENCOUNTER — Ambulatory Visit (HOSPITAL_COMMUNITY)
Admission: RE | Admit: 2013-03-03 | Discharge: 2013-03-03 | Disposition: A | Discharge status: Home / Self Care | Payer: BC Managed Care – PPO | Source: Ambulatory Visit | Attending: Family Medicine | Admitting: Family Medicine

## 2013-03-03 DIAGNOSIS — Z139 Encounter for screening, unspecified: Secondary | ICD-10-CM

## 2013-03-03 DIAGNOSIS — Z1231 Encounter for screening mammogram for malignant neoplasm of breast: Secondary | ICD-10-CM | POA: Insufficient documentation

## 2013-03-20 ENCOUNTER — Encounter: Payer: Self-pay | Admitting: Infectious Disease

## 2013-03-20 ENCOUNTER — Ambulatory Visit (INDEPENDENT_AMBULATORY_CARE_PROVIDER_SITE_OTHER): Payer: BC Managed Care – PPO | Admitting: Infectious Disease

## 2013-03-20 VITALS — BP 126/82 | HR 88 | Temp 98.1°F | Wt 240.1 lb

## 2013-03-20 DIAGNOSIS — Z23 Encounter for immunization: Secondary | ICD-10-CM

## 2013-03-20 DIAGNOSIS — T8453XS Infection and inflammatory reaction due to internal right knee prosthesis, sequela: Secondary | ICD-10-CM

## 2013-03-20 DIAGNOSIS — T8453XA Infection and inflammatory reaction due to internal right knee prosthesis, initial encounter: Secondary | ICD-10-CM

## 2013-03-20 DIAGNOSIS — Z96659 Presence of unspecified artificial knee joint: Secondary | ICD-10-CM

## 2013-03-20 DIAGNOSIS — T889XXS Complication of surgical and medical care, unspecified, sequela: Secondary | ICD-10-CM

## 2013-03-20 DIAGNOSIS — T8450XA Infection and inflammatory reaction due to unspecified internal joint prosthesis, initial encounter: Secondary | ICD-10-CM

## 2013-03-20 LAB — CBC WITH DIFFERENTIAL/PLATELET
Basophils Relative: 0 % (ref 0–1)
Eosinophils Absolute: 0 10*3/uL (ref 0.0–0.7)
Eosinophils Relative: 0 % (ref 0–5)
HCT: 31.6 % — ABNORMAL LOW (ref 36.0–46.0)
Hemoglobin: 10.2 g/dL — ABNORMAL LOW (ref 12.0–15.0)
Lymphs Abs: 1.5 10*3/uL (ref 0.7–4.0)
MCH: 24.3 pg — ABNORMAL LOW (ref 26.0–34.0)
MCHC: 32.3 g/dL (ref 30.0–36.0)
MCV: 75.4 fL — ABNORMAL LOW (ref 78.0–100.0)
Monocytes Absolute: 0.6 10*3/uL (ref 0.1–1.0)
Monocytes Relative: 7 % (ref 3–12)
Neutrophils Relative %: 74 % (ref 43–77)
RBC: 4.19 MIL/uL (ref 3.87–5.11)

## 2013-03-20 LAB — BASIC METABOLIC PANEL WITH GFR
BUN: 8 mg/dL (ref 6–23)
CO2: 29 mEq/L (ref 19–32)
Creat: 0.57 mg/dL (ref 0.50–1.10)
GFR, Est African American: >89 mL/min
Glucose, Bld: 103 mg/dL — ABNORMAL HIGH (ref 70–99)
Potassium: 4.1 mEq/L (ref 3.5–5.3)
Sodium: 140 mEq/L (ref 135–145)

## 2013-03-20 MED ORDER — DOXYCYCLINE HYCLATE 100 MG PO TABS: 100.0000 mg | ORAL_TABLET | Freq: Two times a day (BID) | ORAL | Status: DC

## 2013-03-20 NOTE — Progress Notes (Signed)
Subjective:    Patient ID: Maria Holt, female    DOB: [birth year], 52 y.o.   MRN: [id number]  HPI   52 year old lady with history of prosthetic left knee infection sp I and D and exchange on 04/19/12. She was seen by my partner Dr. Ninetta Lights and sent home on empiric regimen of Vancomycin, rocephin and oral rifampin. One of the knee cultures DID actually grow a CNS but this was not sent for sensitivity testing by the lab. She had developed an apparent PICC line associate infection and PICC was removed and I had placed her on doxycyline while she had no IV access. New PICC was placed and pt restarted on abx (vanco and ceftriaxone) and pt did not tolerate well with flushing, what sounds like red mans syndrome as well as paresthesias, numbness, shaking chills and 8 bowel movements.. She refused reinitiation of the vancomycin and she continued on doxycycline and rifampin. I was able to get her switched to IV daptomycin and she continued on this and oral rifampin.  I dc'd her rifampin at that time because she had multituide of other symptoms including paresthesias, Nausea etc that I thought might be due to this.    Her ESR and CRP had also come down nicely when I last saw her in February but her numbers had worsened in May of 2014, and then again improved when last checked in July. When I saw her in September her sedimentation rate again climbed into the 40s. We therefore have maintained her on doxycycline since then. She is seeing Dr. Dion Saucier who is happy with her progress. Patient states she does have minimal pain with bearing weight and exertion but not at rest and has no fevers she is very eager to come off antibiotics  Review of Systems  Constitutional: Negative for fever, chills, diaphoresis, activity change, appetite change, fatigue and unexpected weight change.  HENT: Negative for congestion, rhinorrhea, sinus pressure, sneezing, sore throat and trouble swallowing.   Eyes: Negative for photophobia  and visual disturbance.  Respiratory: Negative for cough, chest tightness, shortness of breath, wheezing and stridor.   Cardiovascular: Negative for chest pain, palpitations and leg swelling.  Gastrointestinal: Negative for nausea, vomiting, abdominal pain, diarrhea, constipation, blood in stool, abdominal distention and anal bleeding.  Genitourinary: Negative for dysuria, hematuria, flank pain and difficulty urinating.  Musculoskeletal: Negative for arthralgias, back pain, gait problem, joint swelling and myalgias.  Skin: Negative for color change, pallor, rash and wound.  Neurological: Negative for dizziness, tremors, weakness and light-headedness.  Hematological: Negative for adenopathy. Does not bruise/bleed easily.  Psychiatric/Behavioral: Negative for behavioral problems, confusion, sleep disturbance, dysphoric mood, decreased concentration and agitation. The patient is not nervous/anxious.        Objective:   Physical Exam  Constitutional: She is oriented to person, place, and time. She appears well-developed and well-nourished. No distress.  HENT:  Head: Normocephalic and atraumatic.  Mouth/Throat: Oropharynx is clear and moist. No oropharyngeal exudate.  Eyes: Conjunctivae and EOM are normal. Pupils are equal, round, and reactive to light. No scleral icterus.  Neck: Normal range of motion. Neck supple. No JVD present.  Cardiovascular: Normal rate, regular rhythm and normal heart sounds.  Exam reveals no gallop and no friction rub.   No murmur heard. Pulmonary/Chest: Effort normal and breath sounds normal. No respiratory distress. She has no wheezes. She has no rales. She exhibits no tenderness.  Abdominal: She exhibits no distension and no mass. There is no tenderness. There is no  rebound and no guarding.  Musculoskeletal: She exhibits no edema and no tenderness.       Legs: Lymphadenopathy:    She has no cervical adenopathy.  Neurological: She is alert and oriented to person,  place, and time. She has normal reflexes. She exhibits normal muscle tone. Coordination normal.  Skin: Skin is warm and dry. She is not diaphoretic. No erythema. No pallor.  Psychiatric: She has a normal mood and affect. Her behavior is normal. Judgment and thought content normal.          Assessment & Plan:  Prosthetic joint infection: She is approximately 11 months into her course of postoperative antibiotics. check esr and crp today, if these are encouraging and downtrending she may stop her doxycycline and followup with me in 3 months time. If not I would prefer that she stay on though I am willing to negotiate with her if they are in a similar range with a were last time.    CNS: culprit we believe. Sensis never done, culture is gone unfortunately,   Need for influenza vaccination patient refused influence vaccination

## 2013-03-26 ENCOUNTER — Other Ambulatory Visit: Payer: Self-pay | Admitting: Orthopedic Surgery

## 2013-03-26 DIAGNOSIS — M25561 Pain in right knee: Secondary | ICD-10-CM

## 2013-03-28 ENCOUNTER — Ambulatory Visit
Admission: RE | Admit: 2013-03-28 | Discharge: 2013-03-28 | Disposition: A | Discharge status: Home / Self Care | Payer: BC Managed Care – PPO | Source: Ambulatory Visit | Attending: Orthopedic Surgery | Admitting: Orthopedic Surgery

## 2013-03-28 ENCOUNTER — Other Ambulatory Visit: Payer: Self-pay | Admitting: Orthopedic Surgery

## 2013-03-28 DIAGNOSIS — M25561 Pain in right knee: Secondary | ICD-10-CM

## 2013-06-11 ENCOUNTER — Ambulatory Visit (HOSPITAL_COMMUNITY)
Admission: RE | Admit: 2013-06-11 | Discharge: 2013-06-11 | Disposition: A | Discharge status: Home / Self Care | Payer: BC Managed Care – PPO | Source: Ambulatory Visit | Attending: Orthopedic Surgery | Admitting: Orthopedic Surgery

## 2013-06-11 ENCOUNTER — Encounter (HOSPITAL_COMMUNITY): Payer: Self-pay | Admitting: Pharmacy Technician

## 2013-06-11 ENCOUNTER — Encounter (HOSPITAL_COMMUNITY)
Admission: RE | Admit: 2013-06-11 | Discharge: 2013-06-11 | Disposition: A | Discharge status: Home / Self Care | Payer: BC Managed Care – PPO | Source: Ambulatory Visit | Attending: Orthopedic Surgery | Admitting: Orthopedic Surgery

## 2013-06-11 ENCOUNTER — Encounter (HOSPITAL_COMMUNITY): Payer: Self-pay

## 2013-06-11 DIAGNOSIS — Z01818 Encounter for other preprocedural examination: Secondary | ICD-10-CM | POA: Insufficient documentation

## 2013-06-11 DIAGNOSIS — Z01812 Encounter for preprocedural laboratory examination: Secondary | ICD-10-CM | POA: Diagnosis present

## 2013-06-11 DIAGNOSIS — Z01810 Encounter for preprocedural cardiovascular examination: Secondary | ICD-10-CM | POA: Diagnosis not present

## 2013-06-11 DIAGNOSIS — R9431 Abnormal electrocardiogram [ECG] [EKG]: Secondary | ICD-10-CM | POA: Diagnosis not present

## 2013-06-11 LAB — URINALYSIS, ROUTINE W REFLEX MICROSCOPIC
BILIRUBIN URINE: NEGATIVE
GLUCOSE, UA: NEGATIVE mg/dL
KETONES UR: NEGATIVE mg/dL
Leukocytes, UA: NEGATIVE
Nitrite: NEGATIVE
PROTEIN: NEGATIVE mg/dL
Specific Gravity, Urine: 1.019 (ref 1.005–1.030)
Urobilinogen, UA: 0.2 mg/dL (ref 0.0–1.0)
pH: 5.5 (ref 5.0–8.0)

## 2013-06-11 LAB — SURGICAL PCR SCREEN
MRSA, PCR: NEGATIVE
Staphylococcus aureus: NEGATIVE

## 2013-06-11 LAB — BASIC METABOLIC PANEL
BUN: 10 mg/dL (ref 6–23)
CALCIUM: 9.5 mg/dL (ref 8.4–10.5)
CHLORIDE: 99 meq/L (ref 96–112)
CO2: 26 mEq/L (ref 19–32)
CREATININE: 0.6 mg/dL (ref 0.50–1.10)
GFR calc non Af Amer: >90 mL/min (ref >90)
Glucose, Bld: 105 mg/dL — ABNORMAL HIGH (ref 70–99)
Potassium: 3.8 mEq/L (ref 3.7–5.3)
Sodium: 140 mEq/L (ref 137–147)

## 2013-06-11 LAB — PROTIME-INR
INR: 0.96 (ref 0.00–1.49)
Prothrombin Time: 12.6 seconds (ref 11.6–15.2)

## 2013-06-11 LAB — CBC
HEMATOCRIT: 36.2 % (ref 36.0–46.0)
Hemoglobin: 11.6 g/dL — ABNORMAL LOW (ref 12.0–15.0)
MCH: 25.5 pg — ABNORMAL LOW (ref 26.0–34.0)
MCHC: 32 g/dL (ref 30.0–36.0)
MCV: 79.6 fL (ref 78.0–100.0)
PLATELETS: 294 10*3/uL (ref 150–400)
RBC: 4.55 MIL/uL (ref 3.87–5.11)
RDW: 16.3 % — AB (ref 11.5–15.5)
WBC: 7.2 10*3/uL (ref 4.0–10.5)

## 2013-06-11 LAB — URINE MICROSCOPIC-ADD ON

## 2013-06-11 LAB — HCG, SERUM, QUALITATIVE: Preg, Serum: NEGATIVE

## 2013-06-11 LAB — APTT: APTT: 32 s (ref 24–37)

## 2013-06-11 NOTE — Progress Notes (Signed)
Micro, ua results faxed by epic to dr Alvan Dame

## 2013-06-11 NOTE — Patient Instructions (Addendum)
[place]  06/11/2013   Your procedure is scheduled on: Monday February 9th  Report to [facility] at 1200AM.  Call this number if you have problems the morning of surgery [phone]   Remember:  Do not eat food:After Midnight.   clear liquids midnight until 830 am, then nothing by mouth.   Take these medicines the morning of surgery with A SIP OF WATER: prevacid                                SEE [place] PREPARING FOR SURGERY SHEET             You may not have any metal on your body including hair pins and piercings  Do not wear jewelry, make-up.  Do not wear lotions, powders, or perfumes. No  Deodorant is to be worn.   Men may shave face and neck.  Do not bring valuables to the hospital. [place].  Contacts, dentures or bridgework may not be worn into surgery.  Leave suitcase in the car. After surgery it may be brought to your room.  For patients admitted to the hospital, checkout time is 11:00 AM the day of discharge.   Patients discharged the day of surgery will not be allowed to drive home.  Name and phone number of your driver:  Special Instructions: N/A  Please read over the following fact sheets that you were given: [facility] Preparing for surgery sheet, MRSA information, incentive spirometer sheet, blood fact sheet  Call Zelphia Cairo RN pre op nurse if needed [phone]    [place].  PATIENT SIGNATURE___________________________________________  NURSE SIGNATURE_____________________________________________

## 2013-06-11 NOTE — Progress Notes (Signed)
Fax received and placed on pt chart, no action ua micro reults per matt babish pa

## 2013-06-14 NOTE — H&P (Signed)
Maria Holt is an 53 y.o. female.    Proedure:   Resection of right total knee arthroplasty with implantation of antibiotic spacer  Chief Complaint: Infected right total knee arthroplasty  HPI: Pt is a 53 y.o. female complaining of a painful right total knee that was performed in December 2013. This was complicated by the requirement of an early I&D in January 2014 followed by IV antibiotics for six weeks and oral Doxycycline which she is currently on. She had been followed by Dr. Tommy Medal in Infectious Disease. She has noted elevated C reactive protein as well as sedimentation rate recalling that her most recent CRP level was 7, sedimentation rate was 51. She is here to discuss options. She has been seen by various surgeons as well as Dr. Mayer Camel.   Based on her presentation clinically, her lab work and x-rays, she needs to have her knee resected. Dr. Alvan Dame discussed this with her. 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 Dr. Alvan Dame would like for her to stop the Doxycycline two weeks before her surgery to better evaluate for bacteria based on her treatment before Vancomycin reaction Dr. Alvan Dame is recommending she do Coumadin postop. Unfortunately from a work standpoint this will put a bit of a burden on her work. We discussed the work possibilities postop. She will contact Orson Slick if she wishes for [place] to help her with care when she is able to figure out her work status. Questions were encouraged, answered and reviewed.  Risks, benefits and expectations were discussed with the patient.  Risks including but not limited to the risk of anesthesia, blood clots, nerve damage, blood vessel damage, failure of the prosthesis, infection and up to and including death.  Patient understand the risks, benefits and expectations and wishes to proceed with surgery.   PCP:  Purvis Kilts, MD  D/C Plans:  Home with HHPT  Post-op Meds:   Rx given for Robaxin, Iron, Colace and MiraLax  Tranexamic Acid:   Not to be  given  Decadron:    To be given  FYI:  PICC line  Norco post-op  Coumadin post- op (check on this)  PMH: Past Medical History  Diagnosis Date  . GERD (gastroesophageal reflux disease)   . Hypertension   . Chronic lower back pain   . Post-traumatic osteoarthritis of right knee 1989    "tore ACL; failed reconstruction" (03/19/2012)  . De Quervain's disease (tenosynovitis)     "left hand" (03/19/2012)  . 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 OSA on CPAP 2005    CPAP-has not used in years    PSH: Past Surgical History  Procedure Laterality Date  . Anterior cruciate ligament repair  1989    "right" (03/19/2012)  . Knee arthroscopy  07/2001    RIGHT  . Anterior cervical decomp/discectomy fusion  01/2001  . Total knee arthroplasty  03/19/2012    "right" (03/19/2012)  . Total knee arthroplasty  03/19/2012    Procedure: TOTAL KNEE ARTHROPLASTY;  Surgeon: Johnny Bridge, MD;  Location: [place];  Service: Orthopedics;  Laterality: Right;  . I&d knee with poly exchange  04/19/2012    Procedure: IRRIGATION AND DEBRIDEMENT KNEE WITH POLY EXCHANGE;  Surgeon: Johnny Bridge, MD;  Location: [place];  Service: Orthopedics;  Laterality: Right;  irrigation and debridement right knee arthroplasty with poly exhange  . Colonoscopy N/A 02/03/2013    Procedure: COLONOSCOPY;  Surgeon: Danie Binder, MD;  [facility];  Service: Endoscopy;  Laterality: N/A;  11:30 AM  . Back surgery      C 5-6 fusion    Social History:  reports that she has never smoked. She has never used smokeless tobacco. She reports that she does not drink alcohol or use illicit drugs.  Allergies:  Allergies  Allergen Reactions  . Dilaudid [Hydromorphone Hcl] Hives and Other (See Comments)    "I don't remember the reaction; just know it was a no no" (03/19/2012)  . Demerol [Meperidine] Hives  . Morphine And Related Hives  . Hibiclens [Chlorhexidine Gluconate]     CHG wipes make her itch, does ok with hcg  showers with liquid  . Sulfa Antibiotics Other (See Comments)    "don't remember the reaction; Dr. just told me I was allergic" (03/19/2012)    Medications: No current facility-administered medications for this encounter.   Current Outpatient Prescriptions  Medication Sig Dispense Refill  . calcium carbonate (OS-CAL) 600 MG TABS Take 1,200 mg by mouth daily.      Marland Kitchen dextromethorphan-guaiFENesin (MUCINEX DM) 30-600 MG per 12 hr tablet Take 1 tablet by mouth 2 (two) times daily.      Marland Kitchen doxycycline (VIBRA-TABS) 100 MG tablet Take 1 tablet (100 mg total) by mouth 2 (two) times daily.  60 tablet  11  . ferrous sulfate 325 (65 FE) MG tablet Take 975 mg by mouth daily with breakfast.       . fish oil-omega-3 fatty acids 1000 MG capsule Take 1 g by mouth daily.      . lansoprazole (PREVACID) 15 MG capsule Take 15 mg by mouth daily.      Marland Kitchen lisinopril-hydrochlorothiazide (PRINZIDE,ZESTORETIC) 10-12.5 MG per tablet Take 1 tablet by mouth every morning.       . Multiple Vitamin (MULTIVITAMIN WITH MINERALS) TABS Take 1 tablet by mouth daily.      . naproxen sodium (ANAPROX) 220 MG tablet Take 220 mg by mouth 2 (two) times daily with a meal.      . Probiotic Product (PROBIOTIC DAILY PO) Take 1 capsule by mouth daily.      Marland Kitchen triamcinolone cream (KENALOG) 0.1 % Apply 1 application topically as needed (Dry Skin). For eczema          Review of Systems  Constitutional: Negative.   HENT: Negative.   Eyes: Negative.   Respiratory: Negative.   Cardiovascular: Negative.   Gastrointestinal: Positive for heartburn.  Genitourinary: Positive for urgency and frequency.  Musculoskeletal: Positive for back pain and joint pain.  Skin: Positive for rash.  Neurological: Negative.   Endo/Heme/Allergies: Negative.   Psychiatric/Behavioral: The patient has insomnia.     Physical Exam  Constitutional: She is oriented to person, place, and time. She appears well-developed and well-nourished.  HENT:  Head:  Normocephalic and atraumatic.  Mouth/Throat: Oropharynx is clear and moist.  Eyes: Pupils are equal, round, and reactive to light.  Neck: Neck supple. No JVD present. No tracheal deviation present. No thyromegaly present.  Cardiovascular: Normal rate, regular rhythm, normal heart sounds and intact distal pulses.   Respiratory: Effort normal and breath sounds normal. No stridor. No respiratory distress. She has no wheezes.  GI: Soft. There is no tenderness. There is no guarding.  Musculoskeletal:       Right knee: She exhibits decreased range of motion, swelling, effusion, laceration (healed) and bony tenderness. She exhibits no ecchymosis, no deformity and no erythema. Tenderness found.  Lymphadenopathy:    She has no cervical adenopathy.  Neurological: She  is alert and oriented to person, place, and time.  Skin: Skin is warm and dry.  Psychiatric: She has a normal mood and affect.     Assessment/Plan Assessment:   Infected right total knee arthroplasty   Plan: Patient will undergo a resection of right total knee arthroplasty with implantation of antibiotic spacer on 06/23/2013 per Dr. Alvan Dame at [facility]. Risks benefits and expectations were discussed with the patient. Patient understand risks, benefits and expectations and wishes to proceed.   West Pugh Dasja Brase   PAC  06/14/2013, 12:04 PM

## 2013-06-18 ENCOUNTER — Ambulatory Visit: Payer: BC Managed Care – PPO | Admitting: Infectious Disease

## 2013-06-23 ENCOUNTER — Ambulatory Visit (HOSPITAL_COMMUNITY): Payer: BC Managed Care – PPO | Admitting: Anesthesiology

## 2013-06-23 ENCOUNTER — Inpatient Hospital Stay (HOSPITAL_COMMUNITY)
Admission: RE | Admit: 2013-06-23 | Discharge: 2013-06-25 | DRG: 464 | Disposition: A | Discharge status: Home Health | Payer: BC Managed Care – PPO | Source: Ambulatory Visit | Attending: Orthopedic Surgery | Admitting: Orthopedic Surgery

## 2013-06-23 ENCOUNTER — Encounter (HOSPITAL_COMMUNITY): Payer: Self-pay | Admitting: *Deleted

## 2013-06-23 ENCOUNTER — Encounter (HOSPITAL_COMMUNITY): Payer: BC Managed Care – PPO | Admitting: Anesthesiology

## 2013-06-23 ENCOUNTER — Encounter (HOSPITAL_COMMUNITY)
Admission: RE | Disposition: A | Discharge status: Home Health | Payer: Self-pay | Source: Ambulatory Visit | Attending: Orthopedic Surgery

## 2013-06-23 DIAGNOSIS — M545 Low back pain, unspecified: Secondary | ICD-10-CM | POA: Diagnosis present

## 2013-06-23 DIAGNOSIS — Z8249 Family history of ischemic heart disease and other diseases of the circulatory system: Secondary | ICD-10-CM | POA: Diagnosis not present

## 2013-06-23 DIAGNOSIS — K219 Gastro-esophageal reflux disease without esophagitis: Secondary | ICD-10-CM | POA: Diagnosis present

## 2013-06-23 DIAGNOSIS — D62 Acute posthemorrhagic anemia: Secondary | ICD-10-CM | POA: Diagnosis not present

## 2013-06-23 DIAGNOSIS — T8450XA Infection and inflammatory reaction due to unspecified internal joint prosthesis, initial encounter: Principal | ICD-10-CM | POA: Diagnosis present

## 2013-06-23 DIAGNOSIS — Z888 Allergy status to other drugs, medicaments and biological substances status: Secondary | ICD-10-CM

## 2013-06-23 DIAGNOSIS — Z6838 Body mass index (BMI) 38.0-38.9, adult: Secondary | ICD-10-CM

## 2013-06-23 DIAGNOSIS — E669 Obesity, unspecified: Secondary | ICD-10-CM | POA: Diagnosis present

## 2013-06-23 DIAGNOSIS — T8453XA Infection and inflammatory reaction due to internal right knee prosthesis, initial encounter: Secondary | ICD-10-CM

## 2013-06-23 DIAGNOSIS — I10 Essential (primary) hypertension: Secondary | ICD-10-CM | POA: Diagnosis present

## 2013-06-23 DIAGNOSIS — Y831 Surgical operation with implant of artificial internal device as the cause of abnormal reaction of the patient, or of later complication, without mention of misadventure at the time of the procedure: Secondary | ICD-10-CM | POA: Diagnosis present

## 2013-06-23 DIAGNOSIS — G8929 Other chronic pain: Secondary | ICD-10-CM | POA: Diagnosis present

## 2013-06-23 DIAGNOSIS — Z89529 Acquired absence of unspecified knee: Secondary | ICD-10-CM | POA: Diagnosis not present

## 2013-06-23 DIAGNOSIS — Z79899 Other long term (current) drug therapy: Secondary | ICD-10-CM | POA: Diagnosis not present

## 2013-06-23 DIAGNOSIS — G4733 Obstructive sleep apnea (adult) (pediatric): Secondary | ICD-10-CM | POA: Diagnosis present

## 2013-06-23 HISTORY — PX: EXCISIONAL TOTAL KNEE ARTHROPLASTY WITH ANTIBIOTIC SPACERS: SHX5827

## 2013-06-23 LAB — ABO/RH: ABO/RH(D): O POS

## 2013-06-23 LAB — GRAM STAIN
GRAM STAIN: NONE SEEN
Gram Stain: NONE SEEN

## 2013-06-23 LAB — TYPE AND SCREEN
ABO/RH(D): O POS
Antibody Screen: NEGATIVE

## 2013-06-23 SURGERY — REMOVAL, TOTAL ARTHROPLASTY HARDWARE, KNEE, WITH ANTIBIOTIC SPACER INSERTION
Anesthesia: General | Site: Knee | Laterality: Right

## 2013-06-23 MED ORDER — CEFAZOLIN SODIUM-DEXTROSE 2-3 GM-% IV SOLR: 2.0000 g | Freq: Four times a day (QID) | INTRAVENOUS | Status: DC

## 2013-06-23 MED ORDER — PROPOFOL 10 MG/ML IV BOLUS
INTRAVENOUS | Status: DC | PRN
  Administered 2013-06-23: 200 mg via INTRAVENOUS

## 2013-06-23 MED ORDER — LIDOCAINE HCL (PF) 2 % IJ SOLN
INTRAMUSCULAR | Status: DC | PRN
  Administered 2013-06-23: 75 mg via INTRADERMAL

## 2013-06-23 MED ORDER — ALUM & MAG HYDROXIDE-SIMETH 200-200-20 MG/5ML PO SUSP: 30.0000 mL | ORAL | Status: DC | PRN

## 2013-06-23 MED ORDER — SODIUM CHLORIDE 0.9 % IV SOLN
INTRAVENOUS | Status: DC
  Administered 2013-06-23 – 2013-06-24 (×2): via INTRAVENOUS
  Filled 2013-06-23 (×7): qty 1000

## 2013-06-23 MED ORDER — SODIUM CHLORIDE 0.9 % IR SOLN
Status: DC | PRN
  Administered 2013-06-23: 3000 mL

## 2013-06-23 MED ORDER — METOCLOPRAMIDE HCL 10 MG PO TABS: 5.0000 mg | ORAL_TABLET | Freq: Three times a day (TID) | ORAL | Status: DC | PRN

## 2013-06-23 MED ORDER — NEOSTIGMINE METHYLSULFATE 1 MG/ML IJ SOLN
INTRAMUSCULAR | Status: AC
  Filled 2013-06-23: qty 10

## 2013-06-23 MED ORDER — MIDAZOLAM HCL 5 MG/5ML IJ SOLN
INTRAMUSCULAR | Status: DC | PRN
  Administered 2013-06-23: 2 mg via INTRAVENOUS

## 2013-06-23 MED ORDER — FENTANYL CITRATE 0.05 MG/ML IJ SOLN
INTRAMUSCULAR | Status: AC
  Filled 2013-06-23: qty 5

## 2013-06-23 MED ORDER — ONDANSETRON HCL 4 MG/2ML IJ SOLN
INTRAMUSCULAR | Status: DC | PRN
  Administered 2013-06-23: 4 mg via INTRAVENOUS

## 2013-06-23 MED ORDER — METHOCARBAMOL 100 MG/ML IJ SOLN
500.0000 mg | Freq: Four times a day (QID) | INTRAVENOUS | Status: DC | PRN
  Administered 2013-06-23: 500 mg via INTRAVENOUS
  Filled 2013-06-23: qty 5

## 2013-06-23 MED ORDER — PROPOFOL 10 MG/ML IV BOLUS
INTRAVENOUS | Status: AC
  Filled 2013-06-23: qty 20

## 2013-06-23 MED ORDER — NEOSTIGMINE METHYLSULFATE 1 MG/ML IJ SOLN
INTRAMUSCULAR | Status: DC | PRN
  Administered 2013-06-23: 4 mg via INTRAVENOUS

## 2013-06-23 MED ORDER — VANCOMYCIN HCL 1000 MG IV SOLR
INTRAVENOUS | Status: AC
  Filled 2013-06-23: qty 4000

## 2013-06-23 MED ORDER — FENTANYL CITRATE 0.05 MG/ML IJ SOLN
INTRAMUSCULAR | Status: AC
  Filled 2013-06-23: qty 2

## 2013-06-23 MED ORDER — FENTANYL CITRATE 0.05 MG/ML IJ SOLN
INTRAMUSCULAR | Status: DC | PRN
  Administered 2013-06-23 (×2): 50 ug via INTRAVENOUS
  Administered 2013-06-23: 100 ug via INTRAVENOUS
  Administered 2013-06-23 (×2): 50 ug via INTRAVENOUS
  Administered 2013-06-23 (×3): 100 ug via INTRAVENOUS

## 2013-06-23 MED ORDER — SODIUM CHLORIDE 0.9 % IV SOLN
500.0000 mg | INTRAVENOUS | Status: DC
  Administered 2013-06-23 – 2013-06-24 (×2): 500 mg via INTRAVENOUS
  Filled 2013-06-23 (×3): qty 10

## 2013-06-23 MED ORDER — LACTATED RINGERS IV SOLN
INTRAVENOUS | Status: DC
  Administered 2013-06-23: 1000 mL via INTRAVENOUS
  Administered 2013-06-23: 15:00:00 via INTRAVENOUS

## 2013-06-23 MED ORDER — FLEET ENEMA 7-19 GM/118ML RE ENEM: 1.0000 | ENEMA | Freq: Once | RECTAL | Status: AC | PRN

## 2013-06-23 MED ORDER — PANTOPRAZOLE SODIUM 20 MG PO TBEC
20.0000 mg | DELAYED_RELEASE_TABLET | Freq: Every day | ORAL | Status: DC
Start: 2013-06-24 — End: 2013-06-25
  Administered 2013-06-24 – 2013-06-25 (×2): 20 mg via ORAL
  Filled 2013-06-23 (×2): qty 1

## 2013-06-23 MED ORDER — OXYCODONE HCL 5 MG PO TABS
5.0000 mg | ORAL_TABLET | ORAL | Status: DC
  Administered 2013-06-23 – 2013-06-24 (×7): 15 mg via ORAL
  Administered 2013-06-25: 10 mg via ORAL
  Administered 2013-06-25 (×2): 15 mg via ORAL
  Administered 2013-06-25: 10 mg via ORAL
  Filled 2013-06-23: qty 1
  Filled 2013-06-23: qty 2
  Filled 2013-06-23: qty 3
  Filled 2013-06-23: qty 2
  Filled 2013-06-23 (×4): qty 3
  Filled 2013-06-23: qty 2
  Filled 2013-06-23 (×2): qty 3
  Filled 2013-06-23: qty 2
  Filled 2013-06-23: qty 3

## 2013-06-23 MED ORDER — METHOCARBAMOL 500 MG PO TABS
500.0000 mg | ORAL_TABLET | Freq: Four times a day (QID) | ORAL | Status: DC | PRN
  Administered 2013-06-24: 500 mg via ORAL

## 2013-06-23 MED ORDER — ACETAMINOPHEN 10 MG/ML IV SOLN
1000.0000 mg | Freq: Three times a day (TID) | INTRAVENOUS | Status: AC
  Administered 2013-06-24 (×3): 1000 mg via INTRAVENOUS
  Filled 2013-06-23 (×3): qty 100

## 2013-06-23 MED ORDER — DIPHENHYDRAMINE HCL 25 MG PO CAPS
25.0000 mg | ORAL_CAPSULE | Freq: Four times a day (QID) | ORAL | Status: DC | PRN
  Administered 2013-06-23 – 2013-06-25 (×2): 25 mg via ORAL
  Filled 2013-06-23 (×2): qty 1

## 2013-06-23 MED ORDER — ACETAMINOPHEN 325 MG PO TABS: 650.0000 mg | ORAL_TABLET | Freq: Four times a day (QID) | ORAL | Status: DC | PRN

## 2013-06-23 MED ORDER — FERROUS SULFATE 325 (65 FE) MG PO TABS
325.0000 mg | ORAL_TABLET | Freq: Three times a day (TID) | ORAL | Status: DC
  Administered 2013-06-24 – 2013-06-25 (×4): 325 mg via ORAL
  Filled 2013-06-23 (×7): qty 1

## 2013-06-23 MED ORDER — GLYCOPYRROLATE 0.2 MG/ML IJ SOLN
INTRAMUSCULAR | Status: AC
  Filled 2013-06-23: qty 2

## 2013-06-23 MED ORDER — BISACODYL 10 MG RE SUPP: 10.0000 mg | Freq: Every day | RECTAL | Status: DC | PRN

## 2013-06-23 MED ORDER — LIDOCAINE HCL (CARDIAC) 20 MG/ML IV SOLN
INTRAVENOUS | Status: AC
  Filled 2013-06-23: qty 5

## 2013-06-23 MED ORDER — PHENOL 1.4 % MT LIQD
1.0000 | OROMUCOSAL | Status: DC | PRN
Start: 2013-06-23 — End: 2013-06-25

## 2013-06-23 MED ORDER — DOCUSATE SODIUM 100 MG PO CAPS
100.0000 mg | ORAL_CAPSULE | Freq: Two times a day (BID) | ORAL | Status: DC
  Administered 2013-06-23 – 2013-06-25 (×3): 100 mg via ORAL

## 2013-06-23 MED ORDER — POLYETHYLENE GLYCOL 3350 17 G PO PACK
17.0000 g | PACK | Freq: Two times a day (BID) | ORAL | Status: DC
  Administered 2013-06-23 – 2013-06-24 (×2): 17 g via ORAL
  Filled 2013-06-23: qty 1

## 2013-06-23 MED ORDER — GLYCOPYRROLATE 0.2 MG/ML IJ SOLN
INTRAMUSCULAR | Status: DC | PRN
  Administered 2013-06-23: 0.6 mg via INTRAVENOUS

## 2013-06-23 MED ORDER — ONDANSETRON HCL 4 MG/2ML IJ SOLN
4.0000 mg | Freq: Four times a day (QID) | INTRAMUSCULAR | Status: DC | PRN
  Administered 2013-06-25: 4 mg via INTRAVENOUS
  Filled 2013-06-23: qty 2

## 2013-06-23 MED ORDER — CEFAZOLIN SODIUM-DEXTROSE 2-3 GM-% IV SOLR
INTRAVENOUS | Status: AC
  Filled 2013-06-23: qty 50

## 2013-06-23 MED ORDER — KETOROLAC TROMETHAMINE 15 MG/ML IJ SOLN
15.0000 mg | Freq: Four times a day (QID) | INTRAMUSCULAR | Status: DC
  Administered 2013-06-23 – 2013-06-25 (×7): 15 mg via INTRAVENOUS
  Filled 2013-06-23 (×7): qty 1

## 2013-06-23 MED ORDER — CEFAZOLIN SODIUM-DEXTROSE 2-3 GM-% IV SOLR
2.0000 g | INTRAVENOUS | Status: AC
  Administered 2013-06-23: 2 g via INTRAVENOUS

## 2013-06-23 MED ORDER — BUPIVACAINE-EPINEPHRINE PF 0.25-1:200000 % IJ SOLN
INTRAMUSCULAR | Status: AC
  Filled 2013-06-23: qty 30

## 2013-06-23 MED ORDER — MIDAZOLAM HCL 2 MG/2ML IJ SOLN
INTRAMUSCULAR | Status: AC
  Filled 2013-06-23: qty 2

## 2013-06-23 MED ORDER — TOBRAMYCIN SULFATE 1.2 G IJ SOLR
INTRAMUSCULAR | Status: DC | PRN
  Administered 2013-06-23: 4

## 2013-06-23 MED ORDER — DEXAMETHASONE SODIUM PHOSPHATE 10 MG/ML IJ SOLN
INTRAMUSCULAR | Status: AC
  Filled 2013-06-23: qty 1

## 2013-06-23 MED ORDER — KETAMINE HCL 10 MG/ML IJ SOLN
INTRAMUSCULAR | Status: DC | PRN
  Administered 2013-06-23: 10 mg via INTRAVENOUS
  Administered 2013-06-23 (×2): 20 mg via INTRAVENOUS

## 2013-06-23 MED ORDER — ACETAMINOPHEN 650 MG RE SUPP: 650.0000 mg | Freq: Four times a day (QID) | RECTAL | Status: DC | PRN

## 2013-06-23 MED ORDER — TRIAMCINOLONE ACETONIDE 0.1 % EX CREA
1.0000 "application " | TOPICAL_CREAM | CUTANEOUS | Status: DC | PRN
  Filled 2013-06-23: qty 15

## 2013-06-23 MED ORDER — MENTHOL 3 MG MT LOZG
1.0000 | LOZENGE | OROMUCOSAL | Status: DC | PRN
Start: 2013-06-23 — End: 2013-06-25

## 2013-06-23 MED ORDER — ONDANSETRON HCL 4 MG/2ML IJ SOLN
INTRAMUSCULAR | Status: AC
  Filled 2013-06-23: qty 2

## 2013-06-23 MED ORDER — KETOROLAC TROMETHAMINE 30 MG/ML IJ SOLN
INTRAMUSCULAR | Status: AC
  Filled 2013-06-23: qty 1

## 2013-06-23 MED ORDER — FENTANYL CITRATE 0.05 MG/ML IJ SOLN
25.0000 ug | INTRAMUSCULAR | Status: AC | PRN
  Administered 2013-06-23 (×6): 50 ug via INTRAVENOUS

## 2013-06-23 MED ORDER — KETOROLAC TROMETHAMINE 15 MG/ML IJ SOLN
INTRAMUSCULAR | Status: AC
  Filled 2013-06-23: qty 1

## 2013-06-23 MED ORDER — FENTANYL CITRATE 0.05 MG/ML IJ SOLN: 25.0000 ug | INTRAMUSCULAR | Status: DC | PRN

## 2013-06-23 MED ORDER — POVIDONE-IODINE 7.5 % EX SOLN: Freq: Once | CUTANEOUS | Status: DC

## 2013-06-23 MED ORDER — ROCURONIUM BROMIDE 100 MG/10ML IV SOLN
INTRAVENOUS | Status: DC | PRN
  Administered 2013-06-23: 60 mg via INTRAVENOUS

## 2013-06-23 MED ORDER — SODIUM CHLORIDE 0.9 % IJ SOLN
INTRAMUSCULAR | Status: AC
  Filled 2013-06-23: qty 50

## 2013-06-23 MED ORDER — VANCOMYCIN HCL 1000 MG IV SOLR
INTRAVENOUS | Status: DC | PRN
  Administered 2013-06-23: 4 g

## 2013-06-23 MED ORDER — ASPIRIN EC 325 MG PO TBEC
325.0000 mg | DELAYED_RELEASE_TABLET | Freq: Two times a day (BID) | ORAL | Status: DC
  Administered 2013-06-24 – 2013-06-25 (×3): 325 mg via ORAL
  Filled 2013-06-23 (×5): qty 1

## 2013-06-23 MED ORDER — 0.9 % SODIUM CHLORIDE (POUR BTL) OPTIME
TOPICAL | Status: DC | PRN
  Administered 2013-06-23: 1000 mL

## 2013-06-23 MED ORDER — TOBRAMYCIN SULFATE 1.2 G IJ SOLR
INTRAMUSCULAR | Status: AC
  Filled 2013-06-23: qty 4.8

## 2013-06-23 MED ORDER — DEXAMETHASONE SODIUM PHOSPHATE 10 MG/ML IJ SOLN
10.0000 mg | Freq: Once | INTRAMUSCULAR | Status: AC
  Administered 2013-06-23: 10 mg via INTRAVENOUS

## 2013-06-23 MED ORDER — ROCURONIUM BROMIDE 100 MG/10ML IV SOLN
INTRAVENOUS | Status: AC
  Filled 2013-06-23: qty 1

## 2013-06-23 MED ORDER — LACTATED RINGERS IV SOLN
INTRAVENOUS | Status: DC
  Administered 2013-06-23: 19:00:00 via INTRAVENOUS

## 2013-06-23 MED ORDER — GLYCOPYRROLATE 0.2 MG/ML IJ SOLN
INTRAMUSCULAR | Status: AC
  Filled 2013-06-23: qty 1

## 2013-06-23 MED ORDER — ONDANSETRON HCL 4 MG PO TABS: 4.0000 mg | ORAL_TABLET | Freq: Four times a day (QID) | ORAL | Status: DC | PRN

## 2013-06-23 MED ORDER — METOCLOPRAMIDE HCL 5 MG/ML IJ SOLN: 5.0000 mg | Freq: Three times a day (TID) | INTRAMUSCULAR | Status: DC | PRN

## 2013-06-23 SURGICAL SUPPLY — 68 items
ADH SKN CLS APL DERMABOND .7 (GAUZE/BANDAGES/DRESSINGS) ×1
BAG SPEC THK2 15X12 ZIP CLS (MISCELLANEOUS) ×1
BAG ZIPLOCK 12X15 (MISCELLANEOUS) ×2 IMPLANT
BANDAGE ELASTIC 6 VELCRO ST LF (GAUZE/BANDAGES/DRESSINGS) ×2 IMPLANT
BANDAGE ESMARK 6X9 LF (GAUZE/BANDAGES/DRESSINGS) ×1 IMPLANT
BLADE SAW SGTL 13.0X1.19X90.0M (BLADE) ×2 IMPLANT
BLADE SAW SGTL 81X20 HD (BLADE) ×2 IMPLANT
BNDG CMPR 9X6 STRL LF SNTH (GAUZE/BANDAGES/DRESSINGS) ×1
BNDG ESMARK 6X9 LF (GAUZE/BANDAGES/DRESSINGS) ×2
BOWL SMART MIX CTS (DISPOSABLE) IMPLANT
CEMENT HV SMART SET (Cement) ×8 IMPLANT
CUFF TOURN SGL QUICK 34 (TOURNIQUET CUFF) ×2
CUFF TRNQT CYL 34X4X40X1 (TOURNIQUET CUFF) ×1 IMPLANT
DERMABOND ADVANCED (GAUZE/BANDAGES/DRESSINGS) ×1
DERMABOND ADVANCED .7 DNX12 (GAUZE/BANDAGES/DRESSINGS) ×1 IMPLANT
DRAPE EXTREMITY T 121X128X90 (DRAPE) ×2 IMPLANT
DRAPE POUCH INSTRU U-SHP 10X18 (DRAPES) ×2 IMPLANT
DRAPE U-SHAPE 47X51 STRL (DRAPES) ×2 IMPLANT
DRSG ADAPTIC 3X8 NADH LF (GAUZE/BANDAGES/DRESSINGS) ×2 IMPLANT
DRSG AQUACEL AG ADV 3.5X10 (GAUZE/BANDAGES/DRESSINGS) ×2 IMPLANT
DRSG TEGADERM 4X4.75 (GAUZE/BANDAGES/DRESSINGS) ×2 IMPLANT
DURAPREP 26ML APPLICATOR (WOUND CARE) ×2 IMPLANT
ELECT REM PT RETURN 9FT ADLT (ELECTROSURGICAL) ×2
ELECTRODE REM PT RTRN 9FT ADLT (ELECTROSURGICAL) ×1 IMPLANT
EVACUATOR 1/8 PVC DRAIN (DRAIN) ×2 IMPLANT
EVACUATOR 3/16  PVC DRAIN (DRAIN) ×1
EVACUATOR 3/16 PVC DRAIN (DRAIN) IMPLANT
FACESHIELD LNG OPTICON STERILE (SAFETY) ×10 IMPLANT
GAUZE SPONGE 2X2 8PLY STRL LF (GAUZE/BANDAGES/DRESSINGS) ×1 IMPLANT
GAUZE XEROFORM 5X9 LF (GAUZE/BANDAGES/DRESSINGS) ×1 IMPLANT
GLOVE BIOGEL PI IND STRL 7.5 (GLOVE) ×1 IMPLANT
GLOVE BIOGEL PI IND STRL 8 (GLOVE) ×2 IMPLANT
GLOVE BIOGEL PI INDICATOR 7.5 (GLOVE) ×1
GLOVE BIOGEL PI INDICATOR 8 (GLOVE) ×2
GLOVE ORTHO TXT STRL SZ7.5 (GLOVE) ×4 IMPLANT
GLOVE SURG ORTHO 8.0 STRL STRW (GLOVE) ×2 IMPLANT
GOWN SPEC L3 XXLG W/TWL (GOWN DISPOSABLE) ×4 IMPLANT
GOWN STRL REUS W/TWL LRG LVL3 (GOWN DISPOSABLE) ×2 IMPLANT
HANDPIECE INTERPULSE COAX TIP (DISPOSABLE) ×2
IMMOBILIZER KNEE 20 (SOFTGOODS) IMPLANT
KIT BASIN OR (CUSTOM PROCEDURE TRAY) ×2 IMPLANT
MANIFOLD NEPTUNE II (INSTRUMENTS) ×2 IMPLANT
MOLD CEMENT FEMORAL 65MM ×1 IMPLANT
MOLD CEMENT TIBIAL 75MM (Knees) ×1 IMPLANT
NDL SAFETY ECLIPSE 18X1.5 (NEEDLE) ×1 IMPLANT
NEEDLE HYPO 18GX1.5 SHARP (NEEDLE) ×2
NS IRRIG 1000ML POUR BTL (IV SOLUTION) ×2 IMPLANT
PACK TOTAL JOINT (CUSTOM PROCEDURE TRAY) ×2 IMPLANT
PAD ABD 8X10 STRL (GAUZE/BANDAGES/DRESSINGS) ×2 IMPLANT
PADDING CAST COTTON 6X4 STRL (CAST SUPPLIES) ×3 IMPLANT
POSITIONER SURGICAL ARM (MISCELLANEOUS) ×2 IMPLANT
SET HNDPC FAN SPRY TIP SCT (DISPOSABLE) ×1 IMPLANT
SET PAD KNEE POSITIONER (MISCELLANEOUS) ×2 IMPLANT
SPONGE GAUZE 2X2 STER 10/PKG (GAUZE/BANDAGES/DRESSINGS) ×1
SPONGE GAUZE 4X4 12PLY (GAUZE/BANDAGES/DRESSINGS) ×3 IMPLANT
SPONGE LAP 18X18 X RAY DECT (DISPOSABLE) ×2 IMPLANT
STAPLER VISISTAT 35W (STAPLE) IMPLANT
SUCTION FRAZIER 12FR DISP (SUCTIONS) ×2 IMPLANT
SUT PDS AB 1 CT1 27 (SUTURE) IMPLANT
SUT VIC AB 1 CT1 36 (SUTURE) ×6 IMPLANT
SUT VIC AB 2-0 CT1 27 (SUTURE) ×6
SUT VIC AB 2-0 CT1 TAPERPNT 27 (SUTURE) ×3 IMPLANT
SYR 50ML LL SCALE MARK (SYRINGE) ×2 IMPLANT
TOWEL OR 17X26 10 PK STRL BLUE (TOWEL DISPOSABLE) ×4 IMPLANT
TOWER CARTRIDGE SMART MIX (DISPOSABLE) ×2 IMPLANT
TRAY FOLEY CATH 14FRSI W/METER (CATHETERS) ×2 IMPLANT
WATER STERILE IRR 1500ML POUR (IV SOLUTION) ×2 IMPLANT
WRAP KNEE MAXI GEL POST OP (GAUZE/BANDAGES/DRESSINGS) ×2 IMPLANT

## 2013-06-23 NOTE — Anesthesia Preprocedure Evaluation (Addendum)
Anesthesia Evaluation  Patient identified by MRN, date of birth, ID band Patient awake    Reviewed: Allergy & Precautions, H&P , NPO status , Patient's Chart, lab work & pertinent test results  Airway Mallampati: III TM Distance: >3 FB Neck ROM: full    Dental no notable dental hx.    Pulmonary sleep apnea and Continuous Positive Airway Pressure Ventilation ,  breath sounds clear to auscultation  Pulmonary exam normal       Cardiovascular Exercise Tolerance: Good hypertension, Pt. on medications Rhythm:regular Rate:Normal     Neuro/Psych Back surgery negative neurological ROS  negative psych ROS   GI/Hepatic negative GI ROS, Neg liver ROS,   Endo/Other  negative endocrine ROSMorbid obesity  Renal/GU negative Renal ROS  negative genitourinary   Musculoskeletal   Abdominal (+) + obese,   Peds  Hematology negative hematology ROS (+)   Anesthesia Other Findings   Reproductive/Obstetrics negative OB ROS                          Anesthesia Physical Anesthesia Plan  ASA: III  Anesthesia Plan: General   Post-op Pain Management:    Induction: Intravenous  Airway Management Planned: Oral ETT  Additional Equipment:   Intra-op Plan:   Post-operative Plan: Extubation in OR  Informed Consent: I have reviewed the patients History and Physical, chart, labs and discussed the procedure including the risks, benefits and alternatives for the proposed anesthesia with the patient or authorized representative who has indicated his/her understanding and acceptance.   Dental Advisory Given  Plan Discussed with: CRNA and Surgeon  Anesthesia Plan Comments:         Anesthesia Quick Evaluation

## 2013-06-23 NOTE — Transfer of Care (Signed)
Immediate Anesthesia Transfer of Care Note  Patient: Maria Holt  Procedure(s) Performed: Procedure(s): RIGHT RESECTION KNEE ARTHROPLASTY  (Right)  Patient Location: PACU  Anesthesia Type:General  Level of Consciousness: awake, alert  and oriented  Airway & Oxygen Therapy: Patient Spontanous Breathing and Patient connected to face mask oxygen  Post-op Assessment: Report given to PACU RN and Post -op Vital signs reviewed and stable  Post vital signs: Reviewed and stable  Complications: No apparent anesthesia complications

## 2013-06-23 NOTE — Brief Op Note (Signed)
06/23/2013  5:18 PM  PATIENT:  Maria Holt  53 y.o. female  PRE-OPERATIVE DIAGNOSIS:  INFECTED RIGHT TOTAL KNEE  POST-OPERATIVE DIAGNOSIS:  INFECTED RIGHT TOTAL KNEE  PROCEDURE:  Procedure(s): RIGHT RESECTION KNEE ARTHROPLASTY  (Right) Placement of antibiotic spacer  SURGEON:  Surgeon(s) and Role:    * Mauri Pole, MD - Primary  PHYSICIAN ASSISTANT: Danae Orleans, PA-C  ANESTHESIA:   general  EBL:  Total I/O In: 1000 [I.V.:1000] Out: 525 [Urine:525]  BLOOD ADMINISTERED:none  DRAINS: (1 medium) Hemovact drain(s) in the right knee with  Suction Open   LOCAL MEDICATIONS USED:  NONE  SPECIMEN:  Source of Specimen:  right knee X2  DISPOSITION OF SPECIMEN:  PATHOLOGY  COUNTS:  YES  TOURNIQUET:   Total Tourniquet Time Documented: Thigh (Right) - 97 minutes Total: Thigh (Right) - 97 minutes   DICTATION: .Other Dictation: Dictation Number [id number]  PLAN OF CARE: Admit to inpatient   PATIENT DISPOSITION:  PACU - hemodynamically stable.   Delay start of Pharmacological VTE agent (>24hrs) due to surgical blood loss or risk of bleeding: no

## 2013-06-23 NOTE — Preoperative (Signed)
Beta Blockers   Reason not to administer Beta Blockers:Not Applicable 

## 2013-06-23 NOTE — Progress Notes (Signed)
ANTIBIOTIC CONSULT NOTE - INITIAL  Pharmacy Consult for Daptomycin  Indication: PJI, previous vancomycin failure  Allergies  Allergen Reactions  . Dilaudid [Hydromorphone Hcl] Hives and Other (See Comments)    "I don't remember the reaction; just know it was a no no" (03/19/2012)  . Demerol [Meperidine] Hives  . Morphine And Related Hives  . Hibiclens [Chlorhexidine Gluconate]     CHG wipes make her itch, does ok with hcg showers with liquid  . Sulfa Antibiotics Other (See Comments)    "don't remember the reaction; Dr. just told me I was allergic" (03/19/2012)    Patient Measurements: Height: 5\' 6"  (167.6 cm) Weight: 240 lb (108.863 kg) IBW/kg (Calculated) : 59.3 Adjusted Body Weight:   Vital Signs: Temp: 98.8 F (37.1 C) (03/09 1845) Temp src: Oral (03/09 1207) BP: 137/82 mmHg (03/09 1845) Pulse Rate: 67 (03/09 1845) Intake/Output from previous day:   Intake/Output from this shift:    Labs: No results found for this basename: WBC, HGB, PLT, LABCREA, CREATININE,  in the last 72 hours Estimated Creatinine Clearance: 105.1 ml/min (by C-G formula based on Cr of 0.6). No results found for this basename: VANCOTROUGH, Corlis Leak, VANCORANDOM, GENTTROUGH, GENTPEAK, GENTRANDOM, TOBRATROUGH, TOBRAPEAK, TOBRARND, AMIKACINPEAK, AMIKACINTROU, AMIKACIN,  in the last 72 hours   Microbiology: Recent Results (from the past 720 hour(s))  SURGICAL PCR SCREEN     Status: None   Collection Time    06/11/13  8:30 AM      Result Value Ref Range Status   MRSA, PCR NEGATIVE  NEGATIVE Final   Staphylococcus aureus NEGATIVE  NEGATIVE Final   Comment:            The Xpert SA Assay (FDA     approved for NASAL specimens     in patients over 34 years of age),     is one component of     a comprehensive surveillance     program.  Test performance has     been validated by [employer] for patients greater     than or equal to 64 year old.     It is not intended     to diagnose infection  nor to     guide or monitor treatment.  GRAM STAIN     Status: None   Collection Time    06/23/13  3:35 PM      Result Value Ref Range Status   Specimen Description KNEE RIGHT SUPERFICAL POCKET   Final   Special Requests NONE   Final   Gram Stain     Final   Value: NO ORGANISMS SEEN     ABUNDANT WBC PRESENT, PREDOMINANTLY PMN     Gram Stain Report Called to,Read Back By and Verified With: MOSTELLER,P AT [id number] ON [id number] BY POTEAT,S   Report Status 06/23/2013 FINAL   Final  GRAM STAIN     Status: None   Collection Time    06/23/13  3:42 PM      Result Value Ref Range Status   Specimen Description KNEE RIGHT INTRA ARTICULAR   Final   Special Requests NONE   Final   Gram Stain     Final   Value: NO ORGANISMS SEEN     ABUNDANT WBC PRESENT, PREDOMINANTLY PMN     Gram Stain Report Called to,Read Back By and Verified With: MOSTELLER,P AT [place] ON [id number] BY POTEAT,S   Report Status 06/23/2013 FINAL   Final    Medical History:  Past Medical History  Diagnosis Date  . GERD (gastroesophageal reflux disease)   . Hypertension   . Chronic lower back pain   . Post-traumatic osteoarthritis of right knee 1989    "tore ACL; failed reconstruction" (03/19/2012)  . De Quervain's disease (tenosynovitis)     "left hand" (03/19/2012)  . 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 OSA on CPAP 2005    CPAP-has not used in years   Assessment: 53 yoF with infected R TKA now s/p resection and implantation of antibiotic spacer on 06/23/13.  PMHx: Initial RTKA performed Dec 2725 complicated by early I/D Jan 2014 followed by antibiotics x 6 weeks, then chronic prophylactic doxycycline.  Doxycycline was stopped 2 weeks prior to surgery to better evaluate cultures.  Pharmacy consulted to dose daptomycin post-op as pt had previous reaction to vancomycin (flushing / red man syndrome, parasthesias, numbness, shaking, chills and diarrhea per ID note 12/'14) and refuses to reinitiate.  Antibiotic allergies:  Vancomycin (see above), sulfa (unknown).    Antibiotics: 3/9 >> Ancef preop 3/9 >> Daptomycin post-op >>  Renal: SCr 0.60, CrCl 105 ml/min (N>100) WBC: 7.2 Tm24h: 99  Microbiology:  3/9: R knee superficial pocket: gram stain - no organisms seen, abundant WBC 3/9: R knee intra articular: gram stain - no organisms seen, abundant WBC 3/9: Anaerobic and body fluid cultures collected  Goal of Therapy:  Eradication of infection  Plan:  Daptomycin 6mg /kg IV q24h (~500mg ), weekly CPK, monitor for muscle pain or weakness, new onset or worsening peripheral neuropathy.  F/u length of therapy.    Ralene Bathe, PharmD, BCPS 06/23/2013, 7:33 PM  Pager: [phone]

## 2013-06-23 NOTE — Anesthesia Postprocedure Evaluation (Signed)
  Anesthesia Post-op Note  Patient: Maria Holt  Procedure(s) Performed: Procedure(s) (LRB): RIGHT RESECTION KNEE ARTHROPLASTY  (Right)  Patient Location: PACU  Anesthesia Type: General  Level of Consciousness: awake and alert   Airway and Oxygen Therapy: Patient Spontanous Breathing  Post-op Pain: mild  Post-op Assessment: Post-op Vital signs reviewed, Patient's Cardiovascular Status Stable, Respiratory Function Stable, Patent Airway and No signs of Nausea or vomiting  Last Vitals:  Filed Vitals:   06/23/13 1730  BP:   Pulse:   Temp: 37.2 C  Resp:     Post-op Vital Signs: stable   Complications: No apparent anesthesia complications

## 2013-06-23 NOTE — Interval H&P Note (Signed)
History and Physical Interval Note:  06/23/2013 2:13 PM  Maria Holt  has presented today for surgery, with the diagnosis of INFECTED RIGHT TOTAL KNEE  The various methods of treatment have been discussed with the patient and family. After consideration of risks, benefits and other options for treatment, the patient has consented to  Procedure(s): RIGHT RESECTION KNEE ARTHROPLASTY  (Right) as a surgical intervention .  The patient's history has been reviewed, patient examined, no change in status, stable for surgery.  I have reviewed the patient's chart and labs.  Questions were answered to the patient's satisfaction.     Mauri Pole

## 2013-06-24 ENCOUNTER — Encounter (HOSPITAL_COMMUNITY): Payer: Self-pay | Admitting: Orthopedic Surgery

## 2013-06-24 DIAGNOSIS — Y831 Surgical operation with implant of artificial internal device as the cause of abnormal reaction of the patient, or of later complication, without mention of misadventure at the time of the procedure: Secondary | ICD-10-CM

## 2013-06-24 DIAGNOSIS — T8450XA Infection and inflammatory reaction due to unspecified internal joint prosthesis, initial encounter: Secondary | ICD-10-CM | POA: Diagnosis not present

## 2013-06-24 DIAGNOSIS — Z96659 Presence of unspecified artificial knee joint: Secondary | ICD-10-CM

## 2013-06-24 LAB — BASIC METABOLIC PANEL
BUN: 6 mg/dL (ref 6–23)
CO2: 26 mEq/L (ref 19–32)
Calcium: 8.4 mg/dL (ref 8.4–10.5)
Chloride: 97 mEq/L (ref 96–112)
Creatinine, Ser: 0.55 mg/dL (ref 0.50–1.10)
GFR calc Af Amer: >90 mL/min (ref >90)
GFR calc non Af Amer: >90 mL/min (ref >90)
Glucose, Bld: 141 mg/dL — ABNORMAL HIGH (ref 70–99)
Potassium: 4.1 mEq/L (ref 3.7–5.3)
Sodium: 135 mEq/L — ABNORMAL LOW (ref 137–147)

## 2013-06-24 LAB — CBC
HCT: 28.1 % — ABNORMAL LOW (ref 36.0–46.0)
Hemoglobin: 9.2 g/dL — ABNORMAL LOW (ref 12.0–15.0)
MCH: 25.8 pg — ABNORMAL LOW (ref 26.0–34.0)
MCHC: 32.7 g/dL (ref 30.0–36.0)
MCV: 78.7 fL (ref 78.0–100.0)
Platelets: 282 10*3/uL (ref 150–400)
RBC: 3.57 MIL/uL — ABNORMAL LOW (ref 3.87–5.11)
RDW: 15.6 % — ABNORMAL HIGH (ref 11.5–15.5)
WBC: 9.1 10*3/uL (ref 4.0–10.5)

## 2013-06-24 LAB — CK: CK TOTAL: 30 U/L (ref 7–177)

## 2013-06-24 LAB — SEDIMENTATION RATE: Sed Rate: 57 mm/h — ABNORMAL HIGH (ref 0–22)

## 2013-06-24 MED ORDER — SODIUM CHLORIDE 0.9 % IJ SOLN
10.0000 mL | INTRAMUSCULAR | Status: DC | PRN
  Administered 2013-06-25: 10 mL

## 2013-06-24 NOTE — Progress Notes (Signed)
   Subjective: 1 Day Post-Op Procedure(s) (LRB): RIGHT RESECTION KNEE ARTHROPLASTY  (Right)   Patient reports pain as mild, pain controlled. No events throughout the night.   Objective:   VITALS:   Filed Vitals:   06/24/13 1002  BP: 107/78  Pulse: 80  Temp: 98.7 F (37.1 C)  Resp: 18    Neurovascular intact Dorsiflexion/Plantar flexion intact Incision: dressing C/D/I No cellulitis present Compartment soft  LABS  Recent Labs  06/24/13 0453  HGB 9.2*  HCT 28.1*  WBC 9.1  PLT 282     Recent Labs  06/24/13 0453  NA 135*  K 4.1  BUN 6  CREATININE 0.55  GLUCOSE 141*     Assessment/Plan: 1 Day Post-Op Procedure(s) (LRB): RIGHT RESECTION KNEE ARTHROPLASTY  (Right) PICC line today Foley cath d/c'ed Dr. Alvan Dame will discuss with Dr. Johnnye Sima Advance diet Up with therapy D/C IV fluids Discharge home with home health eventually, when ready   West Pugh. Edyn Popoca   PAC  06/24/2013, 3:20 PM

## 2013-06-24 NOTE — Evaluation (Signed)
Physical Therapy Evaluation Patient Details Name: Maria Holt MRN: [id number] DOB: [birth year] Today's Date: 06/24/2013 Time: 1202-1224 PT Time Calculation (min): 22 min  PT Assessment / Plan / Recommendation History of Present Illness  s/p R TKA resection and antibiotic spacer placement  Clinical Impression  Pt is s/p R TKA resulting in the deficits listed below (see PT Problem List).  Pt will benefit from skilled PT to increase their independence and safety with mobility to allow discharge to the venue listed below.  Pt mobilizing well and educated on KI and TDWB status.  Pt concerned about performing stairs into home, states she has a few entrances so may need to attempt various ways to assist pt into home prior to d/c.     PT Assessment  Patient needs continued PT services    Follow Up Recommendations  Home health PT (pending ability to do stairs otherwise will need HHPT after spacer removal)    Does the patient have the potential to tolerate intense rehabilitation      Barriers to Discharge        Equipment Recommendations  Crutches (possibly crutches)    Recommendations for Other Services     Frequency 7X/week    Precautions / Restrictions Precautions Precautions: Knee Precaution Comments: no R knee ROM Required Braces or Orthoses: Knee Immobilizer - Right Knee Immobilizer - Right: On at all times Restrictions Weight Bearing Restrictions: Yes RLE Weight Bearing: Touchdown weight bearing   Pertinent Vitals/Pain Pt reports good pain control, just received pain meds, ice packs applied end of session      Mobility  Bed Mobility General bed mobility comments: pt up in recliner on arrival Transfers Overall transfer level: Needs assistance Equipment used: Rolling walker (2 wheeled) Transfers: Sit to/from Stand Sit to Stand: Min guard General transfer comment: verbal cues for R LE forward otherwise pt able to recall safe technique from previous  TKR Ambulation/Gait Ambulation/Gait assistance: Min guard Ambulation Distance (Feet): 40 Feet Assistive device: Rolling walker (2 wheeled) Gait Pattern/deviations: Step-to pattern General Gait Details: maintains TDWB status very well, pt reports UEs fatigue quickly limiting distance    Exercises Other Exercises Other Exercises: discussed ankle pumps, quad sets and glut sets x10 each   PT Diagnosis: Difficulty walking  PT Problem List: Decreased strength;Decreased activity tolerance;Decreased mobility;Decreased knowledge of precautions;Decreased knowledge of use of DME;Pain PT Treatment Interventions: Gait training;DME instruction;Stair training;Functional mobility training;Therapeutic activities;Therapeutic exercise;Patient/family education     PT Goals(Current goals can be found in the care plan section) Acute Rehab PT Goals PT Goal Formulation: With patient Time For Goal Achievement: 06/28/13 Potential to Achieve Goals: Good  Visit Information  Last PT Received On: 06/24/13 Assistance Needed: +1 History of Present Illness: s/p R TKA resection and antibiotic spacer placement       Prior [place] expects to be discharged to:: Private residence Living Arrangements: Spouse/significant other Available Help at Discharge: Family;Available 24 hours/day Type of Home: House Home Access: Stairs to enter [employer] of Steps: 2 Entrance Stairs-Rails: None Home Layout: One level Home Equipment: Environmental consultant - 2 wheels Prior Function Level of Independence: Independent Communication Communication: No difficulties    Cognition  Cognition Arousal/Alertness: Awake/alert Behavior During Therapy: WFL for tasks assessed/performed Overall Cognitive Status: Within Functional Limits for tasks assessed    Extremity/Trunk Assessment Lower Extremity Assessment Lower Extremity Assessment: RLE deficits/detail RLE Deficits / Details: pt tends to self  assist R LE for placement during transfers, donned and maintained KI, good ankle  pumps   Balance    End of Session PT - End of Session Equipment Utilized During Treatment: Right knee immobilizer Activity Tolerance: Patient limited by fatigue Patient left: in chair;with call bell/phone within reach  GP     [facility] E 06/24/2013, 1:41 PM Carmelia Bake, PT, DPT 06/24/2013 Pager: [phone]

## 2013-06-24 NOTE — Progress Notes (Signed)
OT Cancellation Note  Patient Details Name: NAOMIA LENDERMAN MRN: [id number] DOB: 09/15/1960   Cancelled Treatment:    Reason Eval/Treat Not Completed: OT screened, no needs identified, will sign off. Pt has BSC and assist at home as needed.  Observed moving in room, following tdwb with supervision/min guard  Eisley Barber 06/24/2013, 2:47 PM Lesle Chris, OTR/L [phone] 06/24/2013

## 2013-06-24 NOTE — Progress Notes (Signed)
Peripherally Inserted Central Catheter/Midline Placement  The IV Nurse has discussed with the patient and/or persons authorized to consent for the patient, the purpose of this procedure and the potential benefits and risks involved with this procedure.  The benefits include less needle sticks, lab draws from the catheter and patient may be discharged home with the catheter.  Risks include, but not limited to, infection, bleeding, blood clot (thrombus formation), and puncture of an artery; nerve damage and irregular heat beat.  Alternatives to this procedure were also discussed.  PICC/Midline Placement Documentation        Maria Holt 06/24/2013, 10:04 AM

## 2013-06-24 NOTE — Progress Notes (Signed)
   CARE MANAGEMENT NOTE 06/24/2013  Patient:  Maria Holt, Maria Holt   Account Number:  [id number]  Date Initiated:  06/24/2013  Documentation initiated by:  [facility]  Subjective/Objective Assessment:   RIGHT RESECTION KNEE ARTHROPLASTY     Action/Plan:   [place]   Anticipated DC Date:  06/25/2013   Anticipated DC Plan:  [place]  CM consult      [facility] Choice  HOME HEALTH   Choice offered to / List presented to:  C-1 Patient        [place] arranged  HH-1 RN  [place].   Status of service:  Completed, signed off Medicare Important Message given?   (If response is "NO", the following Medicare IM given date fields will be blank) Date Medicare IM given:   Date Additional Medicare IM given:    Discharge Disposition:  [place]  Per UR Regulation:    If discussed at Long Length of Stay Meetings, dates discussed:    Comments:  06/24/2013 1200 NCM spoke to pt and offered choice for [facility]. Pt states she had [facility] in the past. She has RW and 3n1 at home. Waiting for final recommendations for home, if pt will need IV abx or po abx for home. Notified [facility] for [place]. Jonnie Finner  RN CCM Case Mgmt phone [phone]

## 2013-06-24 NOTE — Progress Notes (Signed)
[name] for Infectious Disease  Date of Admission:  06/23/2013  Date of Consult:  06/24/2013  Reason for Consult: infected R TKR Referring Physician: Alvan Dame  Impression/Recommendation Infected R TKR R TKR resection 06-23-13  Would: Recheck her ESR and CRP Await her Cx Continue daptomycin Consider adding GNR coverage (invanz?) if her Cx are (-).  Have her f/u with Dr Tommy Medal in 2-3 weeks post d/c.   Thank you so much for this interesting consult,   Bobby Rumpf (pager) [phone] [email or web address]  Maria Holt is an 53 y.o. female.  HPI: 53 yo F with hx of R TKR December 2013 then I &D 04-2012 and post-op and was started on ceftriaxone/vanco/rifampin. She had one of her knee Cx that did grow CNS (sensitivities not done by lab). Her course was complicated by a red-man like reaction and she was changed to doxy/rifampin. Due to nausea, paresthesias, she was taken off rifampin and left on doxy alone.  Despite having been on anbx for nearly a year, her ESR and CRP have remained elevated. Her doxy was stopped ~ 3 weeks ago and since then she developed a pustule/abscess on her lateral R knee. This broke open and drained on 3-8.  She was admitted on 3-9 and underwent resection of R TKR on that day. She has since been started on daptomycin.  Gram stain abundant WBC, no organisms. Cx pending.   Past Medical History  Diagnosis Date  . GERD (gastroesophageal reflux disease)   . Hypertension   . Chronic lower back pain   . Post-traumatic osteoarthritis of right knee 1989    "tore ACL; failed reconstruction" (03/19/2012)  . De Quervain's disease (tenosynovitis)     "left hand" (03/19/2012)  . 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 OSA on CPAP 2005    CPAP-has not used in years    Past Surgical History  Procedure Laterality Date  . Anterior cruciate ligament repair  1989    "right" (03/19/2012)  . Knee arthroscopy  07/2001    RIGHT  . Anterior cervical  decomp/discectomy fusion  01/2001  . Total knee arthroplasty  03/19/2012    "right" (03/19/2012)  . Total knee arthroplasty  03/19/2012    Procedure: TOTAL KNEE ARTHROPLASTY;  Surgeon: Johnny Bridge, MD;  Location: [facility];  Service: Orthopedics;  Laterality: Right;  . I&d knee with poly exchange  04/19/2012    Procedure: IRRIGATION AND DEBRIDEMENT KNEE WITH POLY EXCHANGE;  Surgeon: Johnny Bridge, MD;  Location: [facility];  Service: Orthopedics;  Laterality: Right;  irrigation and debridement right knee arthroplasty with poly exhange  . Colonoscopy N/A 02/03/2013    Procedure: COLONOSCOPY;  Surgeon: Danie Binder, MD;  [facility]: [facility];  Service: Endoscopy;  Laterality: N/A;  11:30 AM  . Back surgery      C 5-6 fusion  . Excisional total knee arthroplasty with antibiotic spacers Right 06/23/2013    Procedure: RIGHT RESECTION KNEE ARTHROPLASTY ;  Surgeon: Mauri Pole, MD;  Location: [facility];  Service: Orthopedics;  Laterality: Right;     Allergies  Allergen Reactions  . Dilaudid [Hydromorphone Hcl] Hives and Other (See Comments)    "I don't remember the reaction; just know it was a no no" (03/19/2012)  . Demerol [Meperidine] Hives  . Morphine And Related Hives  . Hibiclens [Chlorhexidine Gluconate]     CHG wipes make her itch, does ok with hcg showers with liquid  . Sulfa Antibiotics Other (See  Comments)    "don't remember the reaction; Dr. just told me I was allergic" (03/19/2012)    Medications:  Scheduled: . acetaminophen  1,000 mg Intravenous TID  . aspirin EC  325 mg Oral BID  . DAPTOmycin (CUBICIN)  IV  500 mg Intravenous Q24H  . docusate sodium  100 mg Oral BID  . ferrous sulfate  325 mg Oral TID PC  . ketorolac  15 mg Intravenous 4 times per day  . oxyCODONE  5-15 mg Oral Q4H  . pantoprazole  20 mg Oral Daily  . polyethylene glycol  17 g Oral BID    Abtx:  Anti-infectives   Start     Dose/Rate Route Frequency Ordered Stop   06/23/13 2100  DAPTOmycin (CUBICIN) 500 mg  in sodium chloride 0.9 % IVPB     500 mg 220 mL/hr over 30 Minutes Intravenous Every 24 hours 06/23/13 1956     06/23/13 1945  ceFAZolin (ANCEF) IVPB 2 g/50 mL premix  Status:  Discontinued     2 g 100 mL/hr over 30 Minutes Intravenous Every 6 hours 06/23/13 1928 06/23/13 1954   06/23/13 1552  vancomycin (VANCOCIN) powder  Status:  Discontinued       As needed 06/23/13 1552 06/23/13 1726   06/23/13 1552  tobramycin (NEBCIN) powder  Status:  Discontinued       As needed 06/23/13 1553 06/23/13 1726   06/23/13 1230  ceFAZolin (ANCEF) IVPB 2 g/50 mL premix     2 g 100 mL/hr over 30 Minutes Intravenous On call to O.R. 06/23/13 1211 06/23/13 1500      Total days of antibiotics 1 (daptomycin)          Social History:  reports that she has never smoked. She has never used smokeless tobacco. She reports that she does not drink alcohol or use illicit drugs.  Family History  Problem Relation Age of Onset  . Coronary artery disease Father     General ROS: no fever or chills, normal apetite, normal BM, normal urination, no proximal erythema, see HPI.   Blood pressure 107/78, pulse 80, temperature 98.7 F (37.1 C), temperature source Oral, resp. rate 18, height 5' 6"  (1.676 m), weight 108.863 kg (240 lb), last menstrual period 02/03/2013, SpO2 100.00%. General appearance: alert, cooperative and no distress Eyes: negative findings: conjunctivae and sclerae normal and pupils equal, round, reactive to light and accomodation Throat: lips, mucosa, and tongue normal; teeth and gums normal Neck: no adenopathy and supple, symmetrical, trachea midline Lungs: clear to auscultation bilaterally Heart: regular rate and rhythm Abdomen: normal findings: bowel sounds normal and soft, non-tender Extremities: RUE PIC is clean. RLE is wrapped. there is no LE edema.    Results for orders placed during the hospital encounter of 06/23/13 (from the past 48 hour(s))  TYPE AND SCREEN     Status: None   Collection  Time    06/23/13 12:30 PM      Result Value Ref Range   ABO/RH(D) O POS     Antibody Screen NEG     Sample Expiration 06/26/2013    ABO/RH     Status: None   Collection Time    06/23/13 12:33 PM      Result Value Ref Range   ABO/RH(D) O POS    ANAEROBIC CULTURE     Status: None   Collection Time    06/23/13  3:35 PM      Result Value Ref Range   Specimen Description KNEE RIGHT  SUPERFICAL POCKET     Special Requests NONE     Gram Stain       Value: ABUNDANT WBC PRESENT,BOTH PMN AND MONONUCLEAR     NO ORGANISMS SEEN     Performed at [facility]   Culture       Value: NO ANAEROBES ISOLATED; CULTURE IN PROGRESS FOR 5 DAYS     Performed at [facility]   Report Status PENDING    BODY FLUID CULTURE     Status: None   Collection Time    06/23/13  3:35 PM      Result Value Ref Range   Specimen Description KNEE RIGHT SUPERFICAL POCKET     Special Requests NONE     Gram Stain       Value: ABUNDANT WBC PRESENT,BOTH PMN AND MONONUCLEAR     NO ORGANISMS SEEN     Performed at [facility]   Culture       Value: NO GROWTH     Performed at [facility]   Report Status PENDING    GRAM STAIN     Status: None   Collection Time    06/23/13  3:35 PM      Result Value Ref Range   Specimen Description KNEE RIGHT SUPERFICAL POCKET     Special Requests NONE     Gram Stain       Value: NO ORGANISMS SEEN     ABUNDANT WBC PRESENT, PREDOMINANTLY PMN     Gram Stain Report Called to,Read Back By and Verified With: MOSTELLER,P AT [place] [id number] BY POTEAT,S   Report Status 06/23/2013 FINAL    ANAEROBIC CULTURE     Status: None   Collection Time    06/23/13  3:42 PM      Result Value Ref Range   Specimen Description KNEE RIGHT INTRA ARTICULAR     Special Requests NONE     Gram Stain       Value: NO WBC SEEN     NO ORGANISMS SEEN     Performed at [facility]   Culture       Value: NO ANAEROBES ISOLATED; CULTURE IN PROGRESS FOR 5 DAYS     Performed  at [facility]   Report Status PENDING    BODY FLUID CULTURE     Status: None   Collection Time    06/23/13  3:42 PM      Result Value Ref Range   Specimen Description KNEE RIGHT INTRA ARTICULAR     Special Requests NONE     Gram Stain       Value: NO WBC SEEN     NO ORGANISMS SEEN     Performed at [facility]   Culture PENDING     Report Status PENDING    GRAM STAIN     Status: None   Collection Time    06/23/13  3:42 PM      Result Value Ref Range   Specimen Description KNEE RIGHT INTRA ARTICULAR     Special Requests NONE     Gram Stain       Value: NO ORGANISMS SEEN     ABUNDANT WBC PRESENT, PREDOMINANTLY PMN     Gram Stain Report Called to,Read Back By and Verified With: MOSTELLER,P AT [id number] ON [id number] BY POTEAT,S   Report Status 06/23/2013 FINAL    CBC     Status: Abnormal   Collection Time  06/24/13  4:53 AM      Result Value Ref Range   WBC 9.1  4.0 - 10.5 K/uL   RBC 3.57 (*) 3.87 - 5.11 MIL/uL   Hemoglobin 9.2 (*) 12.0 - 15.0 g/dL   HCT 28.1 (*) 36.0 - 46.0 %   MCV 78.7  78.0 - 100.0 fL   MCH 25.8 (*) 26.0 - 34.0 pg   MCHC 32.7  30.0 - 36.0 g/dL   RDW 15.6 (*) 11.5 - 15.5 %   Platelets 282  150 - 400 K/uL  BASIC METABOLIC PANEL     Status: Abnormal   Collection Time    06/24/13  4:53 AM      Result Value Ref Range   Sodium 135 (*) 137 - 147 mEq/L   Potassium 4.1  3.7 - 5.3 mEq/L   Chloride 97  96 - 112 mEq/L   CO2 26  19 - 32 mEq/L   Glucose, Bld 141 (*) 70 - 99 mg/dL   BUN 6  6 - 23 mg/dL   Creatinine, Ser 0.55  0.50 - 1.10 mg/dL   Calcium 8.4  8.4 - 10.5 mg/dL   GFR calc non Af Amer >90  >90 mL/min   GFR calc Af Amer >90  >90 mL/min   Comment: (NOTE)     The eGFR has been calculated using the CKD EPI equation.     This calculation has not been validated in all clinical situations.     eGFR's persistently <90 mL/min signify possible Chronic Kidney     Disease.  CK     Status: None   Collection Time    06/24/13  4:53 AM      Result  Value Ref Range   Total CK 30  7 - 177 U/L      Component Value Date/Time   SDES KNEE RIGHT INTRA ARTICULAR 06/23/2013 1542   SDES KNEE RIGHT INTRA ARTICULAR 06/23/2013 1542   SDES KNEE RIGHT INTRA ARTICULAR 06/23/2013 1542   SPECREQUEST NONE 06/23/2013 1542   SPECREQUEST NONE 06/23/2013 1542   SPECREQUEST NONE 06/23/2013 1542   CULT  Value: NO ANAEROBES ISOLATED; CULTURE IN PROGRESS FOR 5 DAYS Performed at [facility] 06/23/2013 1542   CULT PENDING 06/23/2013 1542   REPTSTATUS PENDING 06/23/2013 1542   REPTSTATUS PENDING 06/23/2013 1542   REPTSTATUS 06/23/2013 FINAL 06/23/2013 1542   No results found. Recent Results (from the past 240 hour(s))  ANAEROBIC CULTURE     Status: None   Collection Time    06/23/13  3:35 PM      Result Value Ref Range Status   Specimen Description KNEE RIGHT SUPERFICAL POCKET   Final   Special Requests NONE   Final   Gram Stain     Final   Value: ABUNDANT WBC PRESENT,BOTH PMN AND MONONUCLEAR     NO ORGANISMS SEEN     Performed at [facility]   Culture     Final   Value: NO ANAEROBES ISOLATED; CULTURE IN PROGRESS FOR 5 DAYS     Performed at [facility]   Report Status PENDING   Incomplete  BODY FLUID CULTURE     Status: None   Collection Time    06/23/13  3:35 PM      Result Value Ref Range Status   Specimen Description KNEE RIGHT SUPERFICAL POCKET   Final   Special Requests NONE   Final   Gram Stain     Final   Value:  ABUNDANT WBC PRESENT,BOTH PMN AND MONONUCLEAR     NO ORGANISMS SEEN     Performed at [facility]   Culture     Final   Value: NO GROWTH     Performed at [facility]   Report Status PENDING   Incomplete  GRAM STAIN     Status: None   Collection Time    06/23/13  3:35 PM      Result Value Ref Range Status   Specimen Description KNEE RIGHT SUPERFICAL POCKET   Final   Special Requests NONE   Final   Gram Stain     Final   Value: NO ORGANISMS SEEN     ABUNDANT WBC PRESENT, PREDOMINANTLY PMN     Gram  Stain Report Called to,Read Back By and Verified With: MOSTELLER,P AT [place] [id number] BY POTEAT,S   Report Status 06/23/2013 FINAL   Final  ANAEROBIC CULTURE     Status: None   Collection Time    06/23/13  3:42 PM      Result Value Ref Range Status   Specimen Description KNEE RIGHT INTRA ARTICULAR   Final   Special Requests NONE   Final   Gram Stain     Final   Value: NO WBC SEEN     NO ORGANISMS SEEN     Performed at [facility]   Culture     Final   Value: NO ANAEROBES ISOLATED; CULTURE IN PROGRESS FOR 5 DAYS     Performed at [facility]   Report Status PENDING   Incomplete  BODY FLUID CULTURE     Status: None   Collection Time    06/23/13  3:42 PM      Result Value Ref Range Status   Specimen Description KNEE RIGHT INTRA ARTICULAR   Final   Special Requests NONE   Final   Gram Stain     Final   Value: NO WBC SEEN     NO ORGANISMS SEEN     Performed at [facility]   Culture PENDING   Incomplete   Report Status PENDING   Incomplete  GRAM STAIN     Status: None   Collection Time    06/23/13  3:42 PM      Result Value Ref Range Status   Specimen Description KNEE RIGHT INTRA ARTICULAR   Final   Special Requests NONE   Final   Gram Stain     Final   Value: NO ORGANISMS SEEN     ABUNDANT WBC PRESENT, PREDOMINANTLY PMN     Gram Stain Report Called to,Read Back By and Verified With: MOSTELLER,P AT 5701 ON [id number] BY POTEAT,S   Report Status 06/23/2013 FINAL   Final      06/24/2013, 2:10 PM     LOS: 1 day

## 2013-06-24 NOTE — Progress Notes (Signed)
UR completed. Ileah Falkenstein RN CCM Case Mgmt phone [phone] 

## 2013-06-24 NOTE — Progress Notes (Signed)
Notified at 2014 of labs to be drawn which were ordered for 1500.  Wynelle Cleveland RN VAST.

## 2013-06-24 NOTE — Op Note (Signed)
NAMEADAMARYS, SHALL                 ACCOUNT NO.:  [id number]  MEDICAL RECORD NO.:  [id number]  LOCATION:  9509                         FACILITY:  [facility]  PHYSICIAN:  Pietro Cassis. Alvan Dame, M.D.  DATE OF BIRTH:  Sep 10, 1960  DATE OF PROCEDURE:  06/23/2013 DATE OF DISCHARGE:                              OPERATIVE REPORT   PREOPERATIVE DIAGNOSIS:  Infected right total knee arthroplasty.  POSTOPERATIVE DIAGNOSIS:  Infected right total knee arthroplasty, right lateral knee abscess.  PROCEDURE: 1. Resection of right total knee arthroplasty. 2. Placement of antibiotic spacer. 3. Excisional and nonexcisional debridement of right lateral knee     abscess.  SURGEON:  Pietro Cassis. Alvan Dame, MD  ASSISTANT:  Danae Orleans, PA-C.  Note that Mr. Guinevere Scarlet was present for the entirety of the case from preoperative position to perioperative management of the operative extremity, to general facilitation of the case, and primary wound closure.  ANESTHESIA:  General.  SPECIMENS:  Two fluid samples were taken from the knee with swabs both at the lateral abscess as well as intra-articularly.  DRAINS:  One medium Hemovac.  TOURNIQUET TIME:  Ninety eight minutes at 250 mmHg.  BLOOD LOSS:  Less than 50 mL, the tourniquet was utilized the entire time.  COMPLICATION:  None apparent.  INDICATIONS FOR PROCEDURE:  Ms. Greenlaw is a 53 year old female with a right total knee arthroplasty performed relatively recently with progressive right knee pain.  She had been noted to have some complication in the postoperative period with treatment with antibiotics and failed conservative treatment including an I and D, poly exchange.  She presented to the office for evaluation with persistent pain despite being on oral antibiotics.  She had elevated C. reactive protein and sedimentation rate.  Given the radiographic findings and the cyst in the lateral proximal tibia, elevated labs, it was clear that she had persistent  infection.  It is recommended from our standpoint for resection, arthroplasty, and treatment two-stage technique, risk, benefits, necessitated this type of approach were reviewed and discussed at length.  Consent was obtained for benefit of management of this infectious process.  PROCEDURE IN DETAIL:  The patient was brought to the operative theater. Once adequate anesthesia, preoperative antibiotics, Ancef administered, she was positioned supine with the right thigh tourniquet placed.  Right lower extremity was then prepped and draped in a sterile fashion including this lateral abscess which had a small perforation through the skin.  The leg was exsanguinated, tourniquet elevated to 300 mmHg.  Time- out was performed identifying the patient, planned procedure, and extremity.  The patient's old incision was identified and excised, soft tissue planes created, soft tissue planes were a bit difficult to determine based on some of the adipose tissue and scarring, nonetheless, the extensor mechanism and capsular tissue were identified.  As I was dissecting out this lateral side, we encountered this abscess laterally and took cultures at this time.  At this point, a median arthrotomy was made.  The first part of the case was directed towards exposure and synovectomy as well as debridement of the entire knee, thus eventually allowed for exposure of the knee.  At that point, I was able to remove the  polyethylene insert.  I then used a thin ACL saw and undermined the bone-cement interface on the tibia and the femur.  Both components removed without difficulty or significant bone loss.  All cement was removed off the distal femur and proximal tibia.  She had this large cyst in the proximal lateral tibia which was identified easily and debrided as well.  Once the components and cement were removed, I irrigated the canals with pulse lavage irrigator using the canal brush irrigator.  I then  irrigated the knee with another 4 L of normal saline solution pulse lavage.  Once the components were removed, I had measured them and templated them for Biomet cement molds. On the back table, Babish mixed the cement, and we placed 2 batches of cement with 2 g of vancomycin and 2.4 tobramycin for the femoral component.  The femoral mold and then a single batch of cement with 1 g of vancomycin and tobramycin for the tibial mold measured to be a size 10.  We then mixed another 2 batches of cement with 1 g of vancomycin and 1.2 of Tobra to mix the final components.  Once the final cement molds were ready and the knee had been fully irrigated and debrided, the final components were cemented onto the tibia and the distal femur.  The knee brought to extension.  While the knee was in extension, I evaluated this lateral leg wound.  I was hoping that the preoperative assessment to not have to do anything with this and allowed to heal on its own, however, the tissues were so affected by this infection, and then I decided to go ahead and lift off the affected area.  Thus, a 5-6 cm area was sharply debrided with a scalpel including the skin, subcutaneous and any of the nonviable fibrous tissue.  This had direct communication with joint.  There was a direct communication through the lateral proximal tibia to the outside portion of the joint which is the source of this.  This abscess was directly related to the infection in her knee.  Once the cement had fully cured, and then the wound was irrigated with another 2 L of normal saline solution, I reapproximated the knee with about 30 degrees of flexion with #1 PDS sutures through the entire extensor mechanism.  The remainder of the wound was now closed with 2-0 Vicryl and then staples on the skin.  This lateral 5-6 cm area was reapproximated with 2-0 Vicryl and 2-0 nylon.  The tourniquet was let down after 96 minutes.  The wound was dressed with  Xeroform, anteriorly as well as laterally and wrapped in a bulky sterile wrap.  Medium Hemovac drain had been placed deep and was dressed separately.  The patient was then brought to the recovery room in stable condition tolerating the procedure well.  Infectious Disease will be consulted. She will be on IV Cubicin for 6 weeks, augmented by whatever Infectious Disease feels is adequate.  Given the failure of vancomycin, I think that we will restart with Cubicin and await Infectious Disease consult.  She will be on IV antibiotics for at least 6 weeks.  We will monitor sedimentation rate, C-reactive protein, to make certain that they returned to normal prior to proceeding with surgery in 8-9 weeks now.     Pietro Cassis Alvan Dame, M.D.     MDO/MEDQ  D:  06/23/2013  T:  06/24/2013  Job:  [id number]

## 2013-06-25 DIAGNOSIS — E669 Obesity, unspecified: Secondary | ICD-10-CM | POA: Diagnosis present

## 2013-06-25 DIAGNOSIS — D62 Acute posthemorrhagic anemia: Secondary | ICD-10-CM

## 2013-06-25 LAB — BASIC METABOLIC PANEL
BUN: 8 mg/dL (ref 6–23)
CHLORIDE: 101 meq/L (ref 96–112)
CO2: 28 meq/L (ref 19–32)
Calcium: 8.4 mg/dL (ref 8.4–10.5)
Creatinine, Ser: 0.6 mg/dL (ref 0.50–1.10)
GFR calc Af Amer: >90 mL/min (ref >90)
GFR calc non Af Amer: >90 mL/min (ref >90)
Glucose, Bld: 106 mg/dL — ABNORMAL HIGH (ref 70–99)
POTASSIUM: 3.6 meq/L — AB (ref 3.7–5.3)
Sodium: 140 mEq/L (ref 137–147)

## 2013-06-25 LAB — CBC
HCT: 25 % — ABNORMAL LOW (ref 36.0–46.0)
Hemoglobin: 7.8 g/dL — ABNORMAL LOW (ref 12.0–15.0)
MCH: 25.2 pg — ABNORMAL LOW (ref 26.0–34.0)
MCHC: 31.2 g/dL (ref 30.0–36.0)
MCV: 80.9 fL (ref 78.0–100.0)
PLATELETS: 213 10*3/uL (ref 150–400)
RBC: 3.09 MIL/uL — AB (ref 3.87–5.11)
RDW: 16.1 % — ABNORMAL HIGH (ref 11.5–15.5)
WBC: 7.5 10*3/uL (ref 4.0–10.5)

## 2013-06-25 LAB — C-REACTIVE PROTEIN: CRP: 5.9 mg/dL — AB (ref <0.60)

## 2013-06-25 MED ORDER — OXYCODONE HCL 5 MG PO TABS: 5.0000 mg | ORAL_TABLET | ORAL | Status: DC | PRN

## 2013-06-25 MED ORDER — ASPIRIN 325 MG PO TBEC: 325.0000 mg | DELAYED_RELEASE_TABLET | Freq: Two times a day (BID) | ORAL | Status: AC

## 2013-06-25 MED ORDER — RISAQUAD PO CAPS
1.0000 | ORAL_CAPSULE | Freq: Every day | ORAL | Status: DC
  Administered 2013-06-25: 1 via ORAL
  Filled 2013-06-25: qty 1

## 2013-06-25 MED ORDER — FERROUS SULFATE 325 (65 FE) MG PO TABS: 325.0000 mg | ORAL_TABLET | Freq: Three times a day (TID) | ORAL | Status: DC

## 2013-06-25 MED ORDER — METHOCARBAMOL 500 MG PO TABS: 500.0000 mg | ORAL_TABLET | Freq: Four times a day (QID) | ORAL | Status: DC | PRN

## 2013-06-25 MED ORDER — SODIUM CHLORIDE 0.9 % IV SOLN: 500.0000 mg | INTRAVENOUS | Status: DC

## 2013-06-25 MED ORDER — DSS 100 MG PO CAPS: 100.0000 mg | ORAL_CAPSULE | Freq: Two times a day (BID) | ORAL | Status: DC

## 2013-06-25 MED ORDER — HEPARIN SOD (PORK) LOCK FLUSH 100 UNIT/ML IV SOLN
250.0000 [IU] | INTRAVENOUS | Status: AC | PRN
  Administered 2013-06-25: 250 [IU]

## 2013-06-25 MED ORDER — POLYETHYLENE GLYCOL 3350 17 G PO PACK: 17.0000 g | PACK | Freq: Two times a day (BID) | ORAL | Status: DC

## 2013-06-25 NOTE — Progress Notes (Signed)
   Subjective: 2 Days Post-Op Procedure(s) (LRB): RIGHT RESECTION KNEE ARTHROPLASTY  (Right)   Patient reports pain as mild, pain controlled. No events throughout the night. Feels that the incision looks good, after we removed the current dressing. Discussed her low H&H, she is asymptomatic and we have discussed the use of iron in the post-op period.  Objective:   VITALS:   Filed Vitals:   06/25/13  BP: 125/72  Pulse: 80  Temp: 98.6 F (37 C)   Resp: 18    Neurovascular intact Dorsiflexion/Plantar flexion intact Incision: dressing C/D/I No cellulitis present Compartment soft  LABS  Recent Labs  06/24/13 0453 06/25/13 0645  HGB 9.2* 7.8*  HCT 28.1* 25.0*  WBC 9.1 7.5  PLT 282 213     Recent Labs  06/24/13 0453 06/25/13 0645  NA 135* 140  K 4.1 3.6*  BUN 6 8  CREATININE 0.55 0.60  GLUCOSE 141* 106*     Assessment/Plan: 2 Days Post-Op Procedure(s) (LRB): RIGHT RESECTION KNEE ARTHROPLASTY  (Right) Dressing removed and replaced with Aquacel dressing x2 Up with therapy Discharge home with home health Follow up in 2 weeks at [facility]. Follow up with OLIN,Mostafa Yuan D in 2 weeks.  Contact information:  [facility] [address], [address] [place] [phone]    ABLA  Treated with iron and will observe  Obese (BMI 30-39.9) Estimated body mass index is 38.76 kg/(m^2) as calculated from the following:   Height as of this encounter: 5\' 6"  (1.676 m).   Weight as of this encounter: 108.863 kg (240 lb). Patient also counseled that weight may inhibit the healing process Patient counseled that losing weight will help with future health issues      [place]. Ladislao Cohenour   PAC  06/25/2013, 9:38 AM

## 2013-06-25 NOTE — Progress Notes (Signed)
RN reviewed discharge instructions with patient and family.   All questions answered. Prescriptions and paperwork given.   NT wheeled patient down to personal vehicle with all belongings.

## 2013-06-25 NOTE — Progress Notes (Signed)
Physical Therapy Treatment Patient Details Name: Maria Holt MRN: [id number] DOB: 06-14-1960 Today's Date: 06/25/2013 Time: 4132-4401 PT Time Calculation (min): 25 min  PT Assessment / Plan / Recommendation  History of Present Illness s/p R TKA resection and antibiotic spacer placement   PT Comments   Pt practiced safe stair technique and ambulated today.  Pt feels more comfortable with stairs and practiced 2 ways to get into her home.  Pt no longer wishes for crutches and feels better with stairs so no HHPT recommended as she will likely need more therapy upon removal of spacer.   Follow Up Recommendations  No PT follow up     Does the patient have the potential to tolerate intense rehabilitation     Barriers to Discharge        Equipment Recommendations  None recommended by PT    Recommendations for Other Services    Frequency 7X/week   Progress towards PT Goals Progress towards PT goals: Progressing toward goals  Plan Current plan remains appropriate    Precautions / Restrictions Precautions Precautions: Knee Precaution Comments: no R knee ROM Required Braces or Orthoses: Knee Immobilizer - Right Knee Immobilizer - Right: On at all times Restrictions Weight Bearing Restrictions: Yes RLE Weight Bearing: Touchdown weight bearing   Pertinent Vitals/Pain Min R knee pain, repositioned back in bed, RN in room end of session    Mobility  Bed Mobility Overal bed mobility: Needs Assistance Bed Mobility: Sit to Supine Sit to supine: Supervision General bed mobility comments: pt able to self assist R LE onto bed using UEs Transfers Overall transfer level: Needs assistance Equipment used: Rolling walker (2 wheeled) Transfers: Sit to/from Stand Sit to Stand: Supervision General transfer comment: only supervision for IV line Ambulation/Gait Ambulation/Gait assistance: Min guard;Supervision Ambulation Distance (Feet): 60 Feet Assistive device: Rolling walker (2  wheeled) Gait Pattern/deviations: Step-to pattern General Gait Details: maintains TDWB status very well, pt reports UEs fatigue quickly limiting distance Stairs: Yes Stairs assistance: Min guard Stair Management: Step to pattern;Backwards;With walker Number of Stairs: 2 (x2) General stair comments: required increased time due to pt anxiety, had pt practice hopping prior to performing, pt then given verbal cues for step to pattern backwards with RW up one step however felt unable to perform 2nd step due to RW location so used seat at top of steps and pt sat down onto seat, pt and spouse educated on safety of this technique and also completing backwards 2 steps with RW, pt was able to build her confidence to perform 2 steps however adjusted RW position but pt aware to have someone hold RW    Exercises     PT Diagnosis:    PT Problem List:   PT Treatment Interventions:     PT Goals (current goals can now be found in the care plan section)    Visit Information  Last PT Received On: 06/25/13 Assistance Needed: +1 History of Present Illness: s/p R TKA resection and antibiotic spacer placement    Subjective Data      Cognition  Cognition Arousal/Alertness: Awake/alert Behavior During Therapy: WFL for tasks assessed/performed Overall Cognitive Status: Within Functional Limits for tasks assessed    Balance     End of Session PT - End of Session Equipment Utilized During Treatment: Right knee immobilizer Activity Tolerance: Patient limited by fatigue Patient left: with call bell/phone within reach;in bed;with family/visitor present   GP     Maria Holt,Maria Holt 06/25/2013, 12:42 PM Maria Holt, PT, DPT  06/25/2013 Pager: [phone]

## 2013-06-25 NOTE — Plan of Care (Signed)
Problem: Consults Goal: Diagnosis- Total Joint Replacement Outcome: Completed/Met Date Met:  06/25/13 Revision Total Knee with Abx spacer.

## 2013-06-26 NOTE — Discharge Summary (Signed)
Physician Discharge Summary  Patient ID: Maria Holt MRN: [id number] DOB/AGE: [birth year] 53 y.o.  Admit date: 06/23/2013 Discharge date: 06/25/2013   Procedures:  Procedure(s) (LRB): RIGHT RESECTION KNEE ARTHROPLASTY  (Right)  Attending Physician:  Dr. Paralee Cancel   Admission Diagnoses:   Infected right total knee arthroplasty.  Discharge Diagnoses:  Principal Problem:   S/P Right knee abx spacer / infection Active Problems:   Acute blood loss anemia   Obese  Past Medical History  Diagnosis Date  . GERD (gastroesophageal reflux disease)   . Hypertension   . Chronic lower back pain   . Post-traumatic osteoarthritis of right knee 1989    "tore ACL; failed reconstruction" (03/19/2012)  . De Quervain's disease (tenosynovitis)     "left hand" (03/19/2012)  . 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 OSA on CPAP 2005    CPAP-has not used in years    HPI: Pt is a 53 y.o. female complaining of a painful right total knee that was performed in December 2013. This was complicated by the requirement of an early I&D in January 2014 followed by IV antibiotics for six weeks and oral Doxycycline which she is currently on. She had been followed by Dr. Tommy Medal in Infectious Disease. She has noted elevated C reactive protein as well as sedimentation rate recalling that her most recent CRP level was 7, sedimentation rate was 51. She is here to discuss options. She has been seen by various surgeons as well as Dr. Mayer Camel. Based on her presentation clinically, her lab work and x-rays, she needs to have her knee resected. Dr. Alvan Dame discussed this with her. 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 Dr. Alvan Dame would like for her to stop the Doxycycline two weeks before her surgery to better evaluate for bacteria based on her treatment before Vancomycin reaction Dr. Alvan Dame is recommending she do Coumadin postop. Unfortunately from a work standpoint this will put a bit of a burden on her work. We  discussed the work possibilities postop. She will contact [facility] S[facility] if she wishes for [place] to help her with care when she is able to figure out her work status. Questions were encouraged, answered and reviewed. Risks, benefits and expectations were discussed with the patient. Risks including but not limited to the risk of anesthesia, blood clots, nerve damage, blood vessel damage, failure of the prosthesis, infection and up to and including death. Patient understand the risks, benefits and expectations and wishes to proceed with surgery.   PCP: Purvis Kilts, MD   Discharged Condition: good  Hospital Course:  Patient underwent the above stated procedure on 06/23/2013. Patient tolerated the procedure well and brought to the recovery room in good condition and subsequently to the floor.  POD #1 BP: 107/78 ; Pulse: 80 ; Temp: 98.7 F (37.1 C) ; Resp: 18  Patient reports pain as mild, pain controlled. No events throughout the night. PICC line placed and ID consulted. Neurovascular intact, dorsiflexion/plantar flexion intact, incision: dressing C/D/I, no cellulitis present and compartment soft.   LABS  Basename    HGB  9.2  HCT  28.1   POD #2  BP: 125/72 ; Pulse: 80 ; Temp: 98.6 F (37 C) ; Resp: 18  Patient reports pain as mild, pain controlled. No events throughout the night. Feels that the incision looks good, after we removed the current dressing. Discussed her low H&H, she is asymptomatic and we have discussed the use of iron in the post-op period.  Ready to be discharged home. Neurovascular intact, dorsiflexion/plantar flexion intact, incision: dressing C/D/I, no cellulitis present and compartment soft.   LABS  Basename    HGB  7.8  HCT  25.0    Discharge Exam: General appearance: alert, cooperative and no distress Extremities: Homans sign is negative, no sign of DVT, no edema, redness or tenderness in the calves or thighs and no ulcers, gangrene or trophic  changes  Disposition:    Home-Health Care Svc with follow up in 2 weeks   Follow-up Information   Follow up with [facility] and RN)    Contact information:   [address] [place]  [address] [phone]       Follow up with Mauri Pole, MD. Schedule an appointment as soon as possible for a visit in 2 weeks.   Specialty:  Orthopedic Surgery   Contact information:   [address] [place] [address] [phone]       Follow up with Alcide Evener, MD. Schedule an appointment as soon as possible for a visit in 2 weeks.   Specialty:  Infectious Diseases   Contact information:   [address] [place]  [address] [phone]       Discharge Orders   Future Appointments Provider Department Dept Phone   07/09/2013 1:45 PM Truman Hayward, MD [facility] for Infectious Disease [phone]   Future Orders Complete By Expires   Call MD / Call 911  As directed    Comments:     If you experience chest pain or shortness of breath, CALL 911 and be transported to the hospital emergency room.  If you develope a fever above 101 F, pus (white drainage) or increased drainage or redness at the wound, or calf pain, call your surgeon's office.   Change dressing  As directed    Comments:     Maintain surgical dressing for 10-14 days, or until follow up in the clinic.   Constipation Prevention  As directed    Comments:     Drink plenty of fluids.  Prune juice may be helpful.  You may use a stool softener, such as Colace (over the counter) 100 mg twice a day.  Use MiraLax (over the counter) for constipation as needed.   Diet - low sodium heart healthy  As directed    Discharge instructions  As directed    Comments:     Maintain surgical dressing for 10-14 days, or until follow up in the clinic. Follow up in 2 weeks at [facility]. Call with any  questions or concerns.   Driving restrictions  As directed    Comments:     No driving for 4 weeks   TED hose  As directed    Comments:     Use stockings (TED hose) for 2 weeks on both leg(s).  You may remove them at night for sleeping.   Touch down weight bearing  As directed    Questions:     Laterality:  right   Extremity:  Lower        Medication List    STOP taking these medications       doxycycline 100 MG tablet  Commonly known as:  VIBRA-TABS     naproxen sodium 220 MG tablet  Commonly known as:  ANAPROX      TAKE these medications       acetaminophen 325  MG tablet  Commonly known as:  TYLENOL  Take 650 mg by mouth every 6 (six) hours as needed.     aspirin 325 MG EC tablet  Take 1 tablet (325 mg total) by mouth 2 (two) times daily.     calcium carbonate 600 MG Tabs tablet  Commonly known as:  OS-CAL  Take 1,200 mg by mouth daily.     dextromethorphan-guaiFENesin 30-600 MG per 12 hr tablet  Commonly known as:  MUCINEX DM  Take 1 tablet by mouth 2 (two) times daily.     DSS 100 MG Caps  Take 100 mg by mouth 2 (two) times daily.     ferrous sulfate 325 (65 FE) MG tablet  Take 1 tablet (325 mg total) by mouth 3 (three) times daily after meals.     fish oil-omega-3 fatty acids 1000 MG capsule  Take 1 g by mouth daily.     lansoprazole 15 MG capsule  Commonly known as:  PREVACID  Take 15 mg by mouth daily.     lisinopril-hydrochlorothiazide 10-12.5 MG per tablet  Commonly known as:  PRINZIDE,ZESTORETIC  Take 1 tablet by mouth every morning.     methocarbamol 500 MG tablet  Commonly known as:  ROBAXIN  Take 1 tablet (500 mg total) by mouth every 6 (six) hours as needed for muscle spasms.     multivitamin with minerals Tabs tablet  Take 1 tablet by mouth daily.     oxyCODONE 5 MG immediate release tablet  Commonly known as:  Oxy IR/ROXICODONE  Take 1-3 tablets (5-15 mg total) by mouth every 4 (four) hours as needed for severe pain.      polyethylene glycol packet  Commonly known as:  MIRALAX / GLYCOLAX  Take 17 g by mouth 2 (two) times daily.     PROBIOTIC DAILY PO  Take 1 capsule by mouth daily.     sodium chloride 0.9 % SOLN 100 mL with DAPTOmycin 500 MG SOLR 500 mg  Inject 500 mg into the vein daily.     triamcinolone cream 0.1 %  Commonly known as:  KENALOG  Apply 1 application topically as needed (Dry Skin). For eczema         Signed: [place]. Jolinda Pinkstaff   PAC  06/26/2013, 8:27 AM

## 2013-06-27 LAB — BODY FLUID CULTURE
Culture: NO GROWTH
Culture: NO GROWTH
Gram Stain: NONE SEEN

## 2013-06-29 LAB — ANAEROBIC CULTURE: Gram Stain: NONE SEEN

## 2013-07-09 ENCOUNTER — Ambulatory Visit (INDEPENDENT_AMBULATORY_CARE_PROVIDER_SITE_OTHER): Payer: BC Managed Care – PPO | Admitting: Infectious Disease

## 2013-07-09 ENCOUNTER — Encounter: Payer: Self-pay | Admitting: Infectious Disease

## 2013-07-09 VITALS — BP 127/73 | HR 92 | Temp 98.9°F | Wt 233.0 lb

## 2013-07-09 DIAGNOSIS — Z96659 Presence of unspecified artificial knee joint: Secondary | ICD-10-CM

## 2013-07-09 DIAGNOSIS — T8450XA Infection and inflammatory reaction due to unspecified internal joint prosthesis, initial encounter: Secondary | ICD-10-CM

## 2013-07-09 DIAGNOSIS — T8453XA Infection and inflammatory reaction due to internal right knee prosthesis, initial encounter: Secondary | ICD-10-CM

## 2013-07-09 NOTE — Progress Notes (Signed)
Subjective:    Patient ID: Maria Holt, female    DOB: December 25, 1960, 53 y.o.   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 She was seen by my partner Dr. Johnnye Sima and sent home on empiric regimen of Vancomycin, rocephin and oral rifampin. One of the knee cultures DID actually grow a CNS but this was not sent for sensitivity testing by the lab. She had developed an apparent PICC line associate infection and PICC was removed and I had placed her on doxycyline while she had no IV access. New PICC was placed and pt restarted on abx (vanco and ceftriaxone) and pt did not tolerate well with flushing, what sounds like red mans syndrome as well as paresthesias, numbness, shaking chills and 8 bowel movements.. She refused reinitiation of the vancomycin and she continued on doxycycline and rifampin. I was able to get her switched to IV daptomycin and she continued on this and oral rifampin.  I dc'd her rifampin at that time because she had multituide of other symptoms including paresthesias, Nausea etc that I thought might be due to this.    Her ESR and CRP had also come down nicely when I last saw her in February but her numbers had worsened in May of 2014, and then again improved when last checked in July. When I saw her in September her sedimentation rate again climbed into the 40s. We therefore  maintained her on doxycycline since then. She ultimately however developed worsening pain and her ESR and CRP were still up in the winter.   Dr Alvan Dame and I discussed her case and we took her off doxycyline which she remained off for nearly 3 weeks. Off doxy she developed an abscess -like swelling above her incision. She was taken back to the OR and had I and D and removal of her prosthetic joint with intraoperative cutlures taken which again DID NOT grow any organisms.   She was placed on daptomycin postoperatively and is feeling better.  She is to see Dr Alvan Dame shortly for followup. Dr Baxter Flattery had seen at [facility] and had suggested addition of carbapenem but pt never was started on this and so far appears better 2 weeks into daptomycin.  Review of Systems  Constitutional: Negative for fever, chills, diaphoresis, activity change, appetite change, fatigue and unexpected weight change.  HENT: Negative for congestion, rhinorrhea, sinus pressure, sneezing, sore throat and trouble swallowing.   Eyes: Negative for photophobia and visual disturbance.  Respiratory: Negative for cough, chest tightness, shortness of breath, wheezing and stridor.   Cardiovascular: Negative for chest pain, palpitations and leg swelling.  Gastrointestinal: Negative for nausea, vomiting, abdominal pain, diarrhea, constipation, blood in stool, abdominal distention and anal bleeding.  Genitourinary: Negative for dysuria, hematuria, flank pain and difficulty urinating.  Musculoskeletal: Positive for arthralgias. Negative for back pain, gait problem, joint swelling and myalgias.  Skin: Positive for wound. Negative for color change, pallor and rash.  Neurological: Negative for dizziness, tremors, weakness and light-headedness.  Hematological: Negative for adenopathy. Does not bruise/bleed easily.  Psychiatric/Behavioral: Negative for behavioral problems, confusion, sleep disturbance, dysphoric mood, decreased concentration and agitation. The patient is not nervous/anxious.        Objective:   Physical Exam  Constitutional: She is oriented to person, place, and time. She appears well-developed and well-nourished. No distress.  HENT:  Head: Normocephalic and atraumatic.  Mouth/Throat: Oropharynx is clear and moist.  No oropharyngeal exudate.  Eyes: Conjunctivae and EOM are normal. Pupils are equal, round, and reactive to light. No scleral icterus.  Neck: Normal range of motion. Neck supple. No JVD present.  Cardiovascular: Normal rate, regular rhythm and normal heart  sounds.  Exam reveals no gallop and no friction rub.   No murmur heard. Pulmonary/Chest: Effort normal and breath sounds normal. No respiratory distress. She has no wheezes. She has no rales. She exhibits no tenderness.  Abdominal: She exhibits no distension and no mass. There is no tenderness. There is no rebound and no guarding.  Musculoskeletal: She exhibits no edema and no tenderness.       Legs: Lymphadenopathy:    She has no cervical adenopathy.  Neurological: She is alert and oriented to person, place, and time. She has normal reflexes. She exhibits normal muscle tone. Coordination normal.  Skin: Skin is warm and dry. She is not diaphoretic. No erythema. No pallor.     Psychiatric: She has a normal mood and affect. Her behavior is normal. Judgment and thought content normal.            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 I think likley a Gram positive organsism such as Coag NEg , or Nutirionally deficient strep, possibly diptheroid  --continue dapto for 6 weeks --rtc before finishing her therapy and recheck ESR and CRP at that time    

## 2013-07-28 ENCOUNTER — Telehealth: Payer: Self-pay | Admitting: *Deleted

## 2013-07-28 NOTE — Telephone Encounter (Signed)
Maria Holt with [facility] called to advise the patient is done with Dapto IV 08/03/13 and does not have a follow up appt until 08/14/13 which would put her PICC with no medicine for 11 days. Advised her will change the appt to her 07/29/13 at 9 am.

## 2013-07-29 ENCOUNTER — Ambulatory Visit (INDEPENDENT_AMBULATORY_CARE_PROVIDER_SITE_OTHER): Payer: BC Managed Care – PPO | Admitting: Internal Medicine

## 2013-07-29 ENCOUNTER — Encounter: Payer: Self-pay | Admitting: Internal Medicine

## 2013-07-29 VITALS — BP 137/78 | HR 87 | Temp 98.8°F | Wt 232.0 lb

## 2013-07-29 DIAGNOSIS — T8459XA Infection and inflammatory reaction due to other internal joint prosthesis, initial encounter: Secondary | ICD-10-CM

## 2013-07-29 DIAGNOSIS — T8450XA Infection and inflammatory reaction due to unspecified internal joint prosthesis, initial encounter: Secondary | ICD-10-CM

## 2013-07-29 DIAGNOSIS — Z96659 Presence of unspecified artificial knee joint: Secondary | ICD-10-CM

## 2013-07-29 LAB — SEDIMENTATION RATE: Sed Rate: 15 mm/hr (ref 0–22)

## 2013-07-29 LAB — C-REACTIVE PROTEIN: CRP: 1.5 mg/dL — ABNORMAL HIGH (ref <0.60)

## 2013-07-29 NOTE — Progress Notes (Signed)
Subjective:    Patient ID: Maria Holt, female    DOB: 1960-12-09, 54 y.o.   MRN: [id number]  HPI Maria Holt is a  53 year old Female with history of prosthetic left knee infection sp I and D and exchange on 04/19/12. Treated with empiric regimen of Vancomycin, rocephin and oral rifampin. One of the knee cultures did isolated CoNS but no sensis available. Her course was complicated by PICC line associate infection and PICC was removed and was briefly on doxycyline while she had no IV access. New PICC was placed and pt restarted on vanco and ceftriaxone and pt did not tolerate SE of redman syndrome, as well as  as paresthesias, numbness, shaking chills and diarrhea. She stopped IV antibiotics and transitioned to doxycycline and rifampin. She was switched to IV daptomycin and she continued on this and oral rifampin to finish out course. She had been doing well up until Fall 2014 when sed rate and crp started to increase. She had worsening pain noted in addn to elevated inflammatory markers in winter 2014. Decision was made to take her off doxy to see if anything would culminate,she developed an abscess -like swelling above her incision. She eventually had repeat I and D with hard removal of her prosthetic joint with intraoperative cutlures showing NGTD. She was discharged on daptomycin which she finishes her course of therapy.  She does report episode of Chills/rigors x 2 days but no overt fever. Otherwise doing well. No pain at picc line site. She sees dr. Alvan Dame soon to discuss further surgery for new implant Current Outpatient Prescriptions on File Prior to Visit  Medication Sig Dispense Refill  . calcium carbonate (OS-CAL) 600 MG TABS Take 1,200 mg by mouth daily.      . ferrous sulfate 325 (65 FE) MG tablet Take 1 tablet (325 mg total) by mouth 3 (three) times daily after meals.    3  . lansoprazole (PREVACID) 15 MG capsule Take 15 mg by mouth daily.      Marland Kitchen lisinopril-hydrochlorothiazide  (PRINZIDE,ZESTORETIC) 10-12.5 MG per tablet Take 1 tablet by mouth every morning.       . Probiotic Product (PROBIOTIC DAILY PO) Take 1 capsule by mouth daily.      Marland Kitchen triamcinolone cream (KENALOG) 0.1 % Apply 1 application topically as needed (Dry Skin). For eczema      . acetaminophen (TYLENOL) 500 MG tablet Take 2 tablets (1,000 mg total) by mouth every 8 (eight) hours.  30 tablet  0  . aspirin EC 325 MG EC tablet Take 1 tablet (325 mg total) by mouth 2 (two) times daily.  30 tablet  0  . diphenhydrAMINE (BENADRYL) 25 MG tablet Take 1 tablet (25 mg total) by mouth every 6 (six) hours as needed.  30 tablet  0  . docusate sodium 100 MG CAPS Take 100 mg by mouth 2 (two) times daily.  30 capsule  0  . methocarbamol (ROBAXIN) 500 MG tablet Take 1 tablet (500 mg total) by mouth every 6 (six) hours as needed for muscle spasms.  60 tablet  0  . oxyCODONE (OXY IR/ROXICODONE) 5 MG immediate release tablet Take 1-3 tablets (5-15 mg total) by mouth every 4 (four) hours.  120 tablet  0  . polyethylene glycol (MIRALAX / GLYCOLAX) packet Take 17 g by mouth daily as needed for mild constipation.  14 each  0   No current facility-administered medications on file prior to visit.   Active Ambulatory Problems  Diagnosis Date Noted  . Post-traumatic osteoarthritis of left knee 03/19/2012  . BMI 40.0-44.9, adult 03/19/2012  . Difficulty in walking 04/16/2012  . Stiffness of joint, not elsewhere classified, lower leg 04/16/2012  . Weakness of right leg 04/16/2012  . 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 Postoperative anemia due to acute blood loss 04/21/2012  . Right arm pain 05/13/2012  . Yeast infection 06/10/2012  . S/P Right knee abx spacer / infection 06/23/2013  . Acute blood loss anemia 06/25/2013  . Obese 06/25/2013  . Status post revision of total knee replacement 08/18/2013   Resolved Ambulatory Problems    Diagnosis Date Noted  . No Resolved Ambulatory Problems   Past  Medical History  Diagnosis Date  . GERD (gastroesophageal reflux disease)   . Hypertension   . Chronic lower back pain   . Post-traumatic osteoarthritis of right knee 1989  . De Quervain's disease (tenosynovitis)   . OSA on CPAP 2005  . Anemia       Review of Systems 10 point ros is negative except what is mentioned in hpi    Objective:   Physical Exam BP 137/78  Pulse 87  Temp(Src) 98.8 F (37.1 C) (Oral)  Wt 232 lb (105.235 kg)  LMP 07/09/2013 No exam      Assessment & Plan:  Prosthetic joint infection = nearly finished out with course of daptomycin. [facility] will [facility] on [facility] to see if pull line on Monday. Anticipate she has received full course of therapy for next stage of surgery. Instructed to call if she has fevers or chills after picc line is pulled to see if need to be concern for picc line associated bacteremia

## 2013-07-30 ENCOUNTER — Telehealth: Payer: Self-pay | Admitting: *Deleted

## 2013-07-30 NOTE — Telephone Encounter (Signed)
Message copied by Reggy Eye on Wed Jul 30, 2013  9:41 AM ------      Message from: Reggy Eye      Created: Tue Jul 29, 2013  9:48 AM             Remind Dr Baxter Flattery to check labs and call [facility] if she wants to extend IV Antibiotics ------

## 2013-07-30 NOTE — Telephone Encounter (Signed)
Can pull line on Monday, finish rest of antibiotics until Sunday is her last dose

## 2013-07-30 NOTE — Telephone Encounter (Signed)
Dr Baxter Flattery please check the patient CRP and ESR and advise of stop date the [facility] called this morning to get it advised them will call back once you give order.

## 2013-08-01 NOTE — Telephone Encounter (Signed)
Called the pharmacy at [facility] and was advised to to D/C patient PICC on Monday 08/04/13. Also called patient and made her aware of the orders.

## 2013-08-04 LAB — CULTURE, BLOOD (SINGLE)
ORGANISM ID, BACTERIA: NO GROWTH
Organism ID, Bacteria: NO GROWTH

## 2013-08-04 NOTE — Patient Instructions (Signed)
BERRY GALLACHER  08/04/2013   Your procedure is scheduled on:  08/18/2013   110pm-410pm  Report to [facility] at     1010  AM.  Call this number if you have problems the morning of surgery: [phone]   Remember:   Do not eat food after midnite.               May have clear liquids until 0630am then npo.    Take these medicines the morning of surgery with A SIP OF WATER:    Do not wear jewelry, make-up or nail polish.  Do not wear lotions, powders, or perfumes.   Do not shave 48 hours prior to surgery.   Do not bring valuables to the hospital.  Contacts, dentures or bridgework may not be worn into surgery.             Bring suitcase and leave in car.  After surgery someone can go to car and then bring to room.               Day of discharge check out time is approximately 1100am.            Please read over the following fact sheets that you were given: MRSA Information.    CLEAR LIQUID DIET   Foods Allowed                                                                     Foods Excluded  Coffee and tea, regular and decaf                             liquids that you cannot  Plain Jell-O in any flavor                                             see through such as: Fruit ices (not with fruit pulp)                                     milk, soups, orange juice  Iced Popsicles                                    All solid food Carbonated beverages, regular and diet                                    Cranberry, grape and apple juices Sports drinks like Gatorade Lightly seasoned clear broth or consume(fat free) Sugar, honey syrup  Sample Menu Breakfast                                Lunch  Supper Cranberry juice                    Beef broth                            Chicken broth Jell-O                                     Grape juice                           Apple juice Coffee or tea                        Jell-O                                       Popsicle                                                Coffee or tea                        Coffee or teaC[facility] - Preparing for Surgery Before surgery, you can play an important role.  Because skin is not sterile, your skin needs to be as free of germs as possible.  You can reduce the number of germs on your skin by washing with CHG (chlorahexidine gluconate) soap before surgery.  CHG is an antiseptic cleaner which kills germs and bonds with the skin to continue killing germs even after washing. Please DO NOT use if you have an allergy to CHG or antibacterial soaps.  If your skin becomes reddened/irritated stop using the CHG and inform your nurse when you arrive at Short Stay. Do not shave (including legs and underarms) for at least 48 hours prior to the first CHG shower.  You may shave your face. Please follow these instructions carefully:  1.  Shower with CHG Soap the night before surgery and the  morning of Surgery.  2.  If you choose to wash your hair, wash your hair first as usual with your  normal  shampoo.  3.  After you shampoo, rinse your hair and body thoroughly to remove the  shampoo.                           4.  Use CHG as you would any other liquid soap.  You can apply chg directly  to the skin and wash                       Gently with a scrungie or clean washcloth.  5.  Apply the CHG Soap to your body ONLY FROM THE NECK DOWN.   Do not use on open                           Wound or open sores. Avoid contact with eyes, ears mouth and genitals (private parts).  Genitals (private parts) with your normal soap.             6.  Wash thoroughly, paying special attention to the area where your surgery  will be performed.  7.  Thoroughly rinse your body with warm water from the neck down.  8.  DO NOT shower/wash with your normal soap after using and rinsing off  the CHG Soap.                9.  Pat yourself dry with a clean towel.             10.  Wear clean pajamas.            11.  Place clean sheets on your bed the night of your first shower and do not  sleep with pets. Day of Surgery : Do not apply any lotions/deodorants the morning of surgery.  Please wear clean clothes to the hospital/surgery center.  FAILURE TO FOLLOW THESE INSTRUCTIONS MAY RESULT IN THE CANCELLATION OF YOUR SURGERY PATIENT SIGNATURE_________________________________  NURSE SIGNATURE__________________________________  WHAT IS A BLOOD TRANSFUSION? Blood Transfusion Information  A transfusion is the replacement of blood or some of its parts. Blood is made up of multiple cells which provide different functions.  Red blood cells carry oxygen and are used for blood loss replacement.  White blood cells fight against infection.  Platelets control bleeding.  Plasma helps clot blood.  Other blood products are available for specialized needs, such as hemophilia or other clotting disorders. BEFORE THE TRANSFUSION  Who gives blood for transfusions?   Healthy volunteers who are fully evaluated to make sure their blood is safe. This is blood bank blood. Transfusion therapy is the safest it has ever been in the practice of medicine. Before blood is taken from a donor, a complete history is taken to make sure that person has no history of diseases nor engages in risky social behavior (examples are intravenous drug use or sexual activity with multiple partners). The donor's travel history is screened to minimize risk of transmitting infections, such as malaria. The donated blood is tested for signs of infectious diseases, such as HIV and hepatitis. The blood is then tested to be sure it is compatible with you in order to minimize the chance of a transfusion reaction. If you or a relative donates blood, this is often done in anticipation of surgery and is not appropriate for emergency situations. It takes many days to process the donated blood. RISKS AND  COMPLICATIONS Although transfusion therapy is very safe and saves many lives, the main dangers of transfusion include:   Getting an infectious disease.  Developing a transfusion reaction. This is an allergic reaction to something in the blood you were given. Every precaution is taken to prevent this. The decision to have a blood transfusion has been considered carefully by your caregiver before blood is given. Blood is not given unless the benefits outweigh the risks. AFTER THE TRANSFUSION  Right after receiving a blood transfusion, you will usually feel much better and more energetic. This is especially true if your red blood cells have gotten low (anemic). The transfusion raises the level of the red blood cells which carry oxygen, and this usually causes an energy increase.  The nurse administering the transfusion will monitor you carefully for complications. HOME CARE INSTRUCTIONS  No special instructions are needed after a transfusion. You may find your energy is better. Speak with your caregiver about any limitations on activity for underlying  diseases you may have. SEEK MEDICAL CARE IF:   Your condition is not improving after your transfusion.  You develop redness or irritation at the intravenous (IV) site. SEEK IMMEDIATE MEDICAL CARE IF:  Any of the following symptoms occur over the next 12 hours:  Shaking chills.  You have a temperature by mouth above 102 F (38.9 C), not controlled by medicine.  Chest, back, or muscle pain.  People around you feel you are not acting correctly or are confused.  Shortness of breath or difficulty breathing.  Dizziness and fainting.  You get a rash or develop hives.  You have a decrease in urine output.  Your urine turns a dark color or changes to pink, red, or brown. Any of the following symptoms occur over the next 10 days:  You have a temperature by mouth above 102 F (38.9 C), not controlled by medicine.  Shortness of  breath.  Weakness after normal activity.  The white part of the eye turns yellow (jaundice).  You have a decrease in the amount of urine or are urinating less often.  Your urine turns a dark color or changes to pink, red, or brown. Document Released: 03/31/2000 Document Revised: 06/26/2011 Document Reviewed: 11/18/2007 [facility] Patient Information 2014 [place].    Incentive Spirometer  An incentive spirometer is a tool that can help keep your lungs clear and active. This tool measures how well you are filling your lungs with each breath. Taking long deep breaths may help reverse or decrease the chance of developing breathing (pulmonary) problems (especially infection) following:  A long period of time when you are unable to move or be active. BEFORE THE PROCEDURE   If the spirometer includes an indicator to show your best effort, your nurse or respiratory therapist will set it to a desired goal.  If possible, sit up straight or lean slightly forward. Try not to slouch.  Hold the incentive spirometer in an upright position. INSTRUCTIONS FOR USE  1. Sit on the edge of your bed if possible, or sit up as far as you can in bed or on a chair. 2. Hold the incentive spirometer in an upright position. 3. Breathe out normally. 4. Place the mouthpiece in your mouth and seal your lips tightly around it. 5. Breathe in slowly and as deeply as possible, raising the piston or the ball toward the top of the column. 6. Hold your breath for 3-5 seconds or for as long as possible. Allow the piston or ball to fall to the bottom of the column. 7. Remove the mouthpiece from your mouth and breathe out normally. 8. Rest for a few seconds and repeat Steps 1 through 7 at least 10 times every 1-2 hours when you are awake. Take your time and take a few normal breaths between deep breaths. 9. The spirometer may include an indicator to show your best effort. Use the indicator as a goal to work toward  during each repetition. 10. After each set of 10 deep breaths, practice coughing to be sure your lungs are clear. If you have an incision (the cut made at the time of surgery), support your incision when coughing by placing a pillow or rolled up towels firmly against it. Once you are able to get out of bed, walk around indoors and cough well. You may stop using the incentive spirometer when instructed by your caregiver.  RISKS AND COMPLICATIONS  Take your time so you do not get dizzy or light-headed.  If you  are in pain, you may need to take or ask for pain medication before doing incentive spirometry. It is harder to take a deep breath if you are having pain. AFTER USE  Rest and breathe slowly and easily.  It can be helpful to keep track of a log of your progress. Your caregiver can provide you with a simple table to help with this. If you are using the spirometer at home, follow these instructions: [place] IF:   You are having difficultly using the spirometer.  You have trouble using the spirometer as often as instructed.  Your pain medication is not giving enough relief while using the spirometer.  You develop fever of 100.5 F (38.1 C) or higher. SEEK IMMEDIATE MEDICAL CARE IF:   You cough up bloody sputum that had not been present before.  You develop fever of 102 F (38.9 C) or greater.  You develop worsening pain at or near the incision site. MAKE SURE YOU:   Understand these instructions.  Will watch your condition.  Will get help right away if you are not doing well or get worse. Document Released: 08/14/2006 Document Revised: 06/26/2011 Document Reviewed: 10/15/2006 [facility] Patient Information 2014 [place], [place].

## 2013-08-05 ENCOUNTER — Encounter (HOSPITAL_COMMUNITY): Payer: Self-pay | Admitting: Pharmacy Technician

## 2013-08-06 ENCOUNTER — Encounter (HOSPITAL_COMMUNITY)
Admission: RE | Admit: 2013-08-06 | Discharge: 2013-08-06 | Disposition: A | Discharge status: Home / Self Care | Payer: BC Managed Care – PPO | Source: Ambulatory Visit | Attending: Orthopedic Surgery | Admitting: Orthopedic Surgery

## 2013-08-06 ENCOUNTER — Encounter (HOSPITAL_COMMUNITY): Payer: Self-pay

## 2013-08-06 DIAGNOSIS — Z96659 Presence of unspecified artificial knee joint: Secondary | ICD-10-CM | POA: Insufficient documentation

## 2013-08-06 DIAGNOSIS — Y831 Surgical operation with implant of artificial internal device as the cause of abnormal reaction of the patient, or of later complication, without mention of misadventure at the time of the procedure: Secondary | ICD-10-CM | POA: Insufficient documentation

## 2013-08-06 DIAGNOSIS — Z01812 Encounter for preprocedural laboratory examination: Secondary | ICD-10-CM | POA: Insufficient documentation

## 2013-08-06 DIAGNOSIS — T8450XA Infection and inflammatory reaction due to unspecified internal joint prosthesis, initial encounter: Secondary | ICD-10-CM | POA: Insufficient documentation

## 2013-08-06 HISTORY — DX: Anemia, unspecified: D64.9

## 2013-08-06 LAB — CBC
HEMATOCRIT: 39.1 % (ref 36.0–46.0)
Hemoglobin: 13 g/dL (ref 12.0–15.0)
MCH: 27.9 pg (ref 26.0–34.0)
MCHC: 33.2 g/dL (ref 30.0–36.0)
MCV: 83.9 fL (ref 78.0–100.0)
PLATELETS: 244 10*3/uL (ref 150–400)
RBC: 4.66 MIL/uL (ref 3.87–5.11)
RDW: 16 % — ABNORMAL HIGH (ref 11.5–15.5)
WBC: 7.6 10*3/uL (ref 4.0–10.5)

## 2013-08-06 LAB — URINALYSIS, ROUTINE W REFLEX MICROSCOPIC
BILIRUBIN URINE: NEGATIVE
GLUCOSE, UA: NEGATIVE mg/dL
KETONES UR: NEGATIVE mg/dL
Leukocytes, UA: NEGATIVE
Nitrite: NEGATIVE
PH: 5.5 (ref 5.0–8.0)
PROTEIN: NEGATIVE mg/dL
Specific Gravity, Urine: 1.022 (ref 1.005–1.030)
Urobilinogen, UA: 0.2 mg/dL (ref 0.0–1.0)

## 2013-08-06 LAB — BASIC METABOLIC PANEL
BUN: 9 mg/dL (ref 6–23)
CHLORIDE: 98 meq/L (ref 96–112)
CO2: 26 meq/L (ref 19–32)
CREATININE: 0.66 mg/dL (ref 0.50–1.10)
Calcium: 9.6 mg/dL (ref 8.4–10.5)
GFR calc Af Amer: >90 mL/min (ref >90)
GFR calc non Af Amer: >90 mL/min (ref >90)
Glucose, Bld: 115 mg/dL — ABNORMAL HIGH (ref 70–99)
Potassium: 3.5 mEq/L — ABNORMAL LOW (ref 3.7–5.3)
Sodium: 138 mEq/L (ref 137–147)

## 2013-08-06 LAB — URINE MICROSCOPIC-ADD ON

## 2013-08-06 LAB — SURGICAL PCR SCREEN
MRSA, PCR: NEGATIVE
STAPHYLOCOCCUS AUREUS: NEGATIVE

## 2013-08-06 LAB — APTT: APTT: 31 s (ref 24–37)

## 2013-08-06 LAB — PROTIME-INR
INR: 1.01 (ref 0.00–1.49)
Prothrombin Time: 13.1 seconds (ref 11.6–15.2)

## 2013-08-06 NOTE — Progress Notes (Signed)
Urinalysis and micro results faxed via EPIC to Dr Olin.   

## 2013-08-06 NOTE — H&P (Signed)
TOTAL KNEE REVISION ADMISSION H&P  Patient is being admitted for removal of the antibiotic spacer of the right knee and reimplantation of total knee arthroplasty.  Subjective:  Chief Complaint:      S/P infected right TKA and placement of antibiotic spacer.  HPI:    Pt is a 53 y.o. female who was complaining of a painful right total knee that was performed in December 2013. This was complicated by the requirement of an early I&D in January 2014 followed by IV antibiotics for six weeks and oral doxycycline. She had been followed by Dr. Tommy Medal in Infectious Disease. She had noted elevated C reactive protein as well as sedimentation rate when she saw Dr. Alvan Dame prior to having a resection.  She has been seen by various surgeons as well as Dr. Mayer Camel. Based on her presentation clinically, her lab work and x-rays, she needed to have her right TKA resection on 06/23/2013 and a placement of an antibiotic spacer.  Since that time she has been followed by ID and receiving antibiotic via a PICC line. She states that during her last visit with ID that her sed rate and CRP are the lowest they have been in a long time. She would like to proceed with a resection of the antibiotic spacer and reimplantation of the right total knee arthroplasty.  Risks, benefits and expectations were discussed with the patient.  Risks including but not limited to the risk of anesthesia, blood clots, nerve damage, blood vessel damage, failure of the prosthesis, infection and up to and including death.  Also discussed the possibility that if there is evidence of continued infection that the spacer would be removed and another spacer placed.  Patient understand the risks, benefits and expectations and wishes to proceed with surgery.   D/C Plans:   Home with HHPT   Post-op Meds:    No Rx given   Tranexamic Acid:   To be given  Decadron:    To be given  FYI:    ASA post-op  Oxy post-op    Patient Active Problem List   Diagnosis Date  Noted  . Acute blood loss anemia 06/25/2013  . Obese 06/25/2013  . S/P Right knee abx spacer / infection 06/23/2013  . Yeast infection 06/10/2012  . Right arm pain 05/13/2012  . Postoperative anemia due to acute blood loss 04/21/2012  . 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 Difficulty in walking 04/16/2012  . Stiffness of joint, not elsewhere classified, lower leg 04/16/2012  . Weakness of right leg 04/16/2012  . Post-traumatic osteoarthritis of left knee 03/19/2012  . BMI 40.0-44.9, adult 03/19/2012   Past Medical History  Diagnosis Date  . GERD (gastroesophageal reflux disease)   . Hypertension   . Chronic lower back pain   . Post-traumatic osteoarthritis of right knee 1989    "tore ACL; failed reconstruction" (03/19/2012)  . De Quervain's disease (tenosynovitis)     "left hand" (03/19/2012)  . 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 OSA on CPAP 2005    CPAP-has not used in years  . Anemia     Past Surgical History  Procedure Laterality Date  . Anterior cruciate ligament repair  1989    "right" (03/19/2012)  . Knee arthroscopy  07/2001    RIGHT  . Anterior cervical decomp/discectomy fusion  01/2001  . Total knee arthroplasty  03/19/2012    "right" (03/19/2012)  . Total knee arthroplasty  03/19/2012    Procedure: TOTAL  KNEE ARTHROPLASTY;  Surgeon: Johnny Bridge, MD;  Location: [place];  Service: Orthopedics;  Laterality: Right;  . I&d knee with poly exchange  04/19/2012    Procedure: IRRIGATION AND DEBRIDEMENT KNEE WITH POLY EXCHANGE;  Surgeon: Johnny Bridge, MD;  Location: [place];  Service: Orthopedics;  Laterality: Right;  irrigation and debridement right knee arthroplasty with poly exhange  . Colonoscopy N/A 02/03/2013    Procedure: COLONOSCOPY;  Surgeon: Danie Binder, MD;  [facility]: [facility];  Service: Endoscopy;  Laterality: N/A;  11:30 AM  . Back surgery      C 5-6 fusion  . Excisional total knee arthroplasty with antibiotic spacers  Right 06/23/2013    Procedure: RIGHT RESECTION KNEE ARTHROPLASTY ;  Surgeon: Mauri Pole, MD;  Location: [facility];  Service: Orthopedics;  Laterality: Right;    No prescriptions prior to admission   Allergies  Allergen Reactions  . Dilaudid [Hydromorphone Hcl] Hives and Other (See Comments)    "I don't remember the reaction; just know it was a no no" (03/19/2012)  . Vancomycin Other (See Comments)    Severe red-man-like rxn.  Pt reportedly declines further vancomycin therapy.  . Demerol [Meperidine] Hives  . Morphine And Related Hives  . Hibiclens [Chlorhexidine Gluconate]     CHG wipes make her itch, does ok with hcg showers with liquid  . Sulfa Antibiotics Other (See Comments)    "don't remember the reaction; Dr. just told me I was allergic" (03/19/2012)    History  Substance Use Topics  . Smoking status: Never Smoker   . Smokeless tobacco: Never Used  . Alcohol Use: No    Family History  Problem Relation Age of Onset  . Coronary artery disease Father      Review of Systems  Constitutional: Negative.   HENT: Negative.   Eyes: Negative.   Respiratory: Negative.   Cardiovascular: Negative.   Gastrointestinal: Negative.   Genitourinary: Negative.   Musculoskeletal: Positive for joint pain.  Skin: Negative.   Neurological: Negative.   Endo/Heme/Allergies: Negative.   Psychiatric/Behavioral: Negative.      Objective:  Physical Exam  Constitutional: She is oriented to person, place, and time. She appears well-developed and well-nourished.  HENT:  Head: Normocephalic and atraumatic.  Mouth/Throat: Oropharynx is clear and moist.  Eyes: Pupils are equal, round, and reactive to light.  Neck: Neck supple. No JVD present. No tracheal deviation present. No thyromegaly present.  Cardiovascular: Normal rate, regular rhythm, normal heart sounds and intact distal pulses.   Respiratory: Effort normal and breath sounds normal. No stridor. No respiratory distress. She has no wheezes.   GI: Soft. There is no tenderness. There is no guarding.  Musculoskeletal:       Right knee: She exhibits decreased range of motion, laceration (healed) and bony tenderness. She exhibits no ecchymosis, no deformity and no erythema. Tenderness found.  Lymphadenopathy:    She has no cervical adenopathy.  Neurological: She is alert and oriented to person, place, and time.  Skin: Skin is warm and dry.  Psychiatric: She has a normal mood and affect.    Vital signs in last 24 hours: Temp:  [97.5 F (36.4 C)] 97.5 F (36.4 C) (04/22 1157) Pulse Rate:  [86] 86 (04/22 1157) Resp:  [16] 16 (04/22 1157) BP: (126)/(74) 126/74 mmHg (04/22 1157) SpO2:  [97 %] 97 % (04/22 1157) Weight:  [106.595 kg (235 lb)] 106.595 kg (235 lb) (04/22 1157)  Labs:  Estimated body mass index is 37.63  kg/(m^2) as calculated from the following:   Height as of 06/23/13: 5\' 6"  (1.676 m).   Weight as of 07/09/13: 105.688 kg (233 lb).   Assessment/Plan:  S/P infected right total knee arthroplasty with resection of the TKA and antibiotic spacer   The patient history, physical examination, and clinical judgment of the provider are consistent with previous infected total knee arthroplasty of the right knee. Removal of the antibiotic spacer and reimplantation of the right total knee arthroplasty is deemed medically necessary. The treatment options including medical management, injection therapy, arthroscopy and revision arthroplasty were discussed at length. The risks and benefits of revision total knee arthroplasty were presented and reviewed. The risks due to aseptic loosening, infection, stiffness, patella tracking problems, thromboembolic complications and other imponderables were discussed. The patient acknowledged the explanation, agreed to proceed with the plan and consent was signed. Patient is being admitted for inpatient treatment for surgery, pain control, PT, OT, prophylactic antibiotics, VTE prophylaxis, progressive  ambulation and ADL's and discharge planning.The patient is planning to be discharged home with home health services.     [place] P[place] Shanik [place]   PAC  08/06/2013, 4:01 PM

## 2013-08-14 ENCOUNTER — Ambulatory Visit: Payer: BC Managed Care – PPO | Admitting: Internal Medicine

## 2013-08-18 ENCOUNTER — Ambulatory Visit (HOSPITAL_COMMUNITY): Payer: BC Managed Care – PPO | Admitting: Anesthesiology

## 2013-08-18 ENCOUNTER — Encounter (HOSPITAL_COMMUNITY)
Admission: RE | Disposition: A | Discharge status: Home Health | Payer: Self-pay | Source: Ambulatory Visit | Attending: Orthopedic Surgery

## 2013-08-18 ENCOUNTER — Encounter (HOSPITAL_COMMUNITY): Payer: BC Managed Care – PPO | Admitting: Anesthesiology

## 2013-08-18 ENCOUNTER — Encounter (HOSPITAL_COMMUNITY): Payer: Self-pay | Admitting: *Deleted

## 2013-08-18 ENCOUNTER — Inpatient Hospital Stay (HOSPITAL_COMMUNITY)
Admission: RE | Admit: 2013-08-18 | Discharge: 2013-08-19 | DRG: 468 | Disposition: A | Discharge status: Home Health | Payer: BC Managed Care – PPO | Source: Ambulatory Visit | Attending: Orthopedic Surgery | Admitting: Orthopedic Surgery

## 2013-08-18 DIAGNOSIS — Z6837 Body mass index (BMI) 37.0-37.9, adult: Secondary | ICD-10-CM | POA: Diagnosis not present

## 2013-08-18 DIAGNOSIS — K219 Gastro-esophageal reflux disease without esophagitis: Secondary | ICD-10-CM | POA: Diagnosis present

## 2013-08-18 DIAGNOSIS — Z881 Allergy status to other antibiotic agents status: Secondary | ICD-10-CM | POA: Diagnosis not present

## 2013-08-18 DIAGNOSIS — Y831 Surgical operation with implant of artificial internal device as the cause of abnormal reaction of the patient, or of later complication, without mention of misadventure at the time of the procedure: Secondary | ICD-10-CM | POA: Diagnosis present

## 2013-08-18 DIAGNOSIS — Z01812 Encounter for preprocedural laboratory examination: Secondary | ICD-10-CM | POA: Diagnosis not present

## 2013-08-18 DIAGNOSIS — M25569 Pain in unspecified knee: Secondary | ICD-10-CM | POA: Diagnosis present

## 2013-08-18 DIAGNOSIS — M545 Low back pain, unspecified: Secondary | ICD-10-CM | POA: Diagnosis present

## 2013-08-18 DIAGNOSIS — Z883 Allergy status to other anti-infective agents status: Secondary | ICD-10-CM | POA: Diagnosis not present

## 2013-08-18 DIAGNOSIS — T8450XA Infection and inflammatory reaction due to unspecified internal joint prosthesis, initial encounter: Principal | ICD-10-CM | POA: Diagnosis present

## 2013-08-18 DIAGNOSIS — M1710 Unilateral primary osteoarthritis, unspecified knee: Secondary | ICD-10-CM | POA: Diagnosis present

## 2013-08-18 DIAGNOSIS — Z96659 Presence of unspecified artificial knee joint: Secondary | ICD-10-CM

## 2013-08-18 DIAGNOSIS — Z8249 Family history of ischemic heart disease and other diseases of the circulatory system: Secondary | ICD-10-CM | POA: Diagnosis not present

## 2013-08-18 DIAGNOSIS — Z885 Allergy status to narcotic agent status: Secondary | ICD-10-CM

## 2013-08-18 DIAGNOSIS — I10 Essential (primary) hypertension: Secondary | ICD-10-CM | POA: Diagnosis present

## 2013-08-18 DIAGNOSIS — G8929 Other chronic pain: Secondary | ICD-10-CM | POA: Diagnosis present

## 2013-08-18 DIAGNOSIS — G4733 Obstructive sleep apnea (adult) (pediatric): Secondary | ICD-10-CM | POA: Diagnosis present

## 2013-08-18 HISTORY — PX: REIMPLANTATION OF TOTAL KNEE: SHX6052

## 2013-08-18 LAB — TYPE AND SCREEN
ABO/RH(D): O POS
Antibody Screen: NEGATIVE

## 2013-08-18 LAB — HCG, SERUM, QUALITATIVE: PREG SERUM: NEGATIVE

## 2013-08-18 SURGERY — REVISION, TOTAL ARTHROPLASTY, KNEE
Anesthesia: General | Site: Knee | Laterality: Right

## 2013-08-18 MED ORDER — POLYETHYLENE GLYCOL 3350 17 G PO PACK: 17.0000 g | PACK | Freq: Every day | ORAL | Status: DC | PRN

## 2013-08-18 MED ORDER — ASPIRIN EC 325 MG PO TBEC
325.0000 mg | DELAYED_RELEASE_TABLET | Freq: Two times a day (BID) | ORAL | Status: DC
  Administered 2013-08-19: 325 mg via ORAL
  Filled 2013-08-18 (×3): qty 1

## 2013-08-18 MED ORDER — TRIAMCINOLONE ACETONIDE 0.1 % EX CREA
1.0000 "application " | TOPICAL_CREAM | CUTANEOUS | Status: DC | PRN
  Filled 2013-08-18: qty 15

## 2013-08-18 MED ORDER — PHENOL 1.4 % MT LIQD: 1.0000 | OROMUCOSAL | Status: DC | PRN

## 2013-08-18 MED ORDER — BUPIVACAINE-EPINEPHRINE (PF) 0.25% -1:200000 IJ SOLN
INTRAMUSCULAR | Status: AC
Start: 2013-08-18 — End: 2013-08-18
  Filled 2013-08-18: qty 30

## 2013-08-18 MED ORDER — FENTANYL CITRATE 0.05 MG/ML IJ SOLN
25.0000 ug | INTRAMUSCULAR | Status: DC | PRN
  Administered 2013-08-18: 25 ug via INTRAVENOUS
  Filled 2013-08-18: qty 2

## 2013-08-18 MED ORDER — FENTANYL CITRATE 0.05 MG/ML IJ SOLN
INTRAMUSCULAR | Status: AC
  Filled 2013-08-18: qty 2

## 2013-08-18 MED ORDER — FENTANYL CITRATE 0.05 MG/ML IJ SOLN
INTRAMUSCULAR | Status: AC
  Filled 2013-08-18: qty 5

## 2013-08-18 MED ORDER — ALUM & MAG HYDROXIDE-SIMETH 200-200-20 MG/5ML PO SUSP: 30.0000 mL | ORAL | Status: DC | PRN

## 2013-08-18 MED ORDER — LACTATED RINGERS IV SOLN: INTRAVENOUS | Status: DC

## 2013-08-18 MED ORDER — SUCCINYLCHOLINE CHLORIDE 20 MG/ML IJ SOLN
INTRAMUSCULAR | Status: DC | PRN
  Administered 2013-08-18: 100 mg via INTRAVENOUS

## 2013-08-18 MED ORDER — TRANEXAMIC ACID 100 MG/ML IV SOLN
1000.0000 mg | Freq: Once | INTRAVENOUS | Status: AC
  Administered 2013-08-18: 1000 mg via INTRAVENOUS
  Filled 2013-08-18: qty 10

## 2013-08-18 MED ORDER — SODIUM CHLORIDE 0.9 % IR SOLN
Status: DC | PRN
  Administered 2013-08-18: 2000 mL

## 2013-08-18 MED ORDER — SENNA 8.6 MG PO TABS
1.0000 | ORAL_TABLET | Freq: Two times a day (BID) | ORAL | Status: DC
Start: 2013-08-18 — End: 2013-08-19
  Administered 2013-08-18 – 2013-08-19 (×2): 8.6 mg via ORAL

## 2013-08-18 MED ORDER — SODIUM CHLORIDE 0.9 % IJ SOLN
INTRAMUSCULAR | Status: AC
Start: 2013-08-18 — End: 2013-08-18
  Filled 2013-08-18: qty 20

## 2013-08-18 MED ORDER — DIPHENHYDRAMINE HCL 12.5 MG/5ML PO ELIX
25.0000 mg | ORAL_SOLUTION | Freq: Four times a day (QID) | ORAL | Status: DC | PRN
  Administered 2013-08-18 – 2013-08-19 (×2): 25 mg via ORAL
  Filled 2013-08-18 (×2): qty 10

## 2013-08-18 MED ORDER — NEOSTIGMINE METHYLSULFATE 10 MG/10ML IV SOLN
INTRAVENOUS | Status: AC
  Filled 2013-08-18: qty 1

## 2013-08-18 MED ORDER — CEFAZOLIN SODIUM-DEXTROSE 2-3 GM-% IV SOLR
2.0000 g | Freq: Four times a day (QID) | INTRAVENOUS | Status: AC
  Administered 2013-08-18 – 2013-08-19 (×2): 2 g via INTRAVENOUS
  Filled 2013-08-18 (×2): qty 50

## 2013-08-18 MED ORDER — ONDANSETRON HCL 4 MG/2ML IJ SOLN: 4.0000 mg | Freq: Four times a day (QID) | INTRAMUSCULAR | Status: DC | PRN

## 2013-08-18 MED ORDER — SODIUM CHLORIDE 0.9 % IV SOLN
INTRAVENOUS | Status: DC
  Administered 2013-08-18 – 2013-08-19 (×2): via INTRAVENOUS
  Filled 2013-08-18 (×4): qty 1000

## 2013-08-18 MED ORDER — GLYCOPYRROLATE 0.2 MG/ML IJ SOLN
INTRAMUSCULAR | Status: AC
  Filled 2013-08-18: qty 4

## 2013-08-18 MED ORDER — OXYCODONE HCL 5 MG PO TABS
ORAL_TABLET | ORAL | Status: AC
  Filled 2013-08-18: qty 2

## 2013-08-18 MED ORDER — GLYCOPYRROLATE 0.2 MG/ML IJ SOLN
INTRAMUSCULAR | Status: DC | PRN
Start: 2013-08-18 — End: 2013-08-18
  Administered 2013-08-18: .6 mg via INTRAVENOUS

## 2013-08-18 MED ORDER — NEOSTIGMINE METHYLSULFATE 10 MG/10ML IV SOLN
INTRAVENOUS | Status: DC | PRN
  Administered 2013-08-18: 4 mg via INTRAVENOUS

## 2013-08-18 MED ORDER — MIDAZOLAM HCL 5 MG/5ML IJ SOLN
INTRAMUSCULAR | Status: DC | PRN
  Administered 2013-08-18: 2 mg via INTRAVENOUS

## 2013-08-18 MED ORDER — LIDOCAINE HCL (CARDIAC) 20 MG/ML IV SOLN
INTRAVENOUS | Status: DC | PRN
  Administered 2013-08-18: 50 mg via INTRAVENOUS

## 2013-08-18 MED ORDER — ROCURONIUM BROMIDE 100 MG/10ML IV SOLN
INTRAVENOUS | Status: AC
  Filled 2013-08-18: qty 1

## 2013-08-18 MED ORDER — DIPHENHYDRAMINE HCL 50 MG/ML IJ SOLN
12.5000 mg | Freq: Four times a day (QID) | INTRAMUSCULAR | Status: DC | PRN
  Administered 2013-08-18: 12.5 mg via INTRAVENOUS

## 2013-08-18 MED ORDER — METHOCARBAMOL 1000 MG/10ML IJ SOLN
500.0000 mg | Freq: Four times a day (QID) | INTRAMUSCULAR | Status: DC | PRN
  Administered 2013-08-18: 500 mg via INTRAVENOUS
  Filled 2013-08-18: qty 5

## 2013-08-18 MED ORDER — ACETAMINOPHEN 500 MG PO TABS
1000.0000 mg | ORAL_TABLET | Freq: Three times a day (TID) | ORAL | Status: DC
  Administered 2013-08-18 – 2013-08-19 (×2): 1000 mg via ORAL
  Filled 2013-08-18 (×3): qty 2

## 2013-08-18 MED ORDER — ROCURONIUM BROMIDE 100 MG/10ML IV SOLN
INTRAVENOUS | Status: DC | PRN
  Administered 2013-08-18: 35 mg via INTRAVENOUS
  Administered 2013-08-18: 5 mg via INTRAVENOUS

## 2013-08-18 MED ORDER — KETOROLAC TROMETHAMINE 30 MG/ML IJ SOLN
INTRAMUSCULAR | Status: DC | PRN
  Administered 2013-08-18: 30 mg

## 2013-08-18 MED ORDER — PANTOPRAZOLE SODIUM 20 MG PO TBEC
20.0000 mg | DELAYED_RELEASE_TABLET | Freq: Every day | ORAL | Status: DC
  Administered 2013-08-19: 20 mg via ORAL
  Filled 2013-08-18: qty 1

## 2013-08-18 MED ORDER — PROPOFOL 10 MG/ML IV BOLUS
INTRAVENOUS | Status: AC
  Filled 2013-08-18: qty 40

## 2013-08-18 MED ORDER — ONDANSETRON HCL 4 MG/2ML IJ SOLN
INTRAMUSCULAR | Status: DC | PRN
  Administered 2013-08-18: 4 mg via INTRAVENOUS

## 2013-08-18 MED ORDER — CEFAZOLIN SODIUM-DEXTROSE 2-3 GM-% IV SOLR
2.0000 g | INTRAVENOUS | Status: AC
  Administered 2013-08-18: 2 g via INTRAVENOUS

## 2013-08-18 MED ORDER — BUPIVACAINE-EPINEPHRINE (PF) 0.25% -1:200000 IJ SOLN
INTRAMUSCULAR | Status: DC | PRN
  Administered 2013-08-18: 25 mL

## 2013-08-18 MED ORDER — METOCLOPRAMIDE HCL 5 MG/ML IJ SOLN: 5.0000 mg | Freq: Three times a day (TID) | INTRAMUSCULAR | Status: DC | PRN

## 2013-08-18 MED ORDER — METHOCARBAMOL 500 MG PO TABS
500.0000 mg | ORAL_TABLET | Freq: Four times a day (QID) | ORAL | Status: DC | PRN
  Administered 2013-08-19: 500 mg via ORAL
  Filled 2013-08-18 (×2): qty 1

## 2013-08-18 MED ORDER — MIDAZOLAM HCL 2 MG/2ML IJ SOLN
INTRAMUSCULAR | Status: AC
  Filled 2013-08-18: qty 2

## 2013-08-18 MED ORDER — ONDANSETRON HCL 4 MG/2ML IJ SOLN
INTRAMUSCULAR | Status: AC
  Filled 2013-08-18: qty 2

## 2013-08-18 MED ORDER — KETOROLAC TROMETHAMINE 30 MG/ML IJ SOLN
INTRAMUSCULAR | Status: AC
  Filled 2013-08-18: qty 1

## 2013-08-18 MED ORDER — FENTANYL CITRATE 0.05 MG/ML IJ SOLN
25.0000 ug | INTRAMUSCULAR | Status: DC | PRN
  Administered 2013-08-18 (×2): 50 ug via INTRAVENOUS

## 2013-08-18 MED ORDER — CEFAZOLIN SODIUM-DEXTROSE 2-3 GM-% IV SOLR
INTRAVENOUS | Status: AC
  Filled 2013-08-18: qty 50

## 2013-08-18 MED ORDER — POVIDONE-IODINE 7.5 % EX SOLN: Freq: Once | CUTANEOUS | Status: DC

## 2013-08-18 MED ORDER — DOCUSATE SODIUM 100 MG PO CAPS
100.0000 mg | ORAL_CAPSULE | Freq: Two times a day (BID) | ORAL | Status: DC
  Administered 2013-08-18 – 2013-08-19 (×2): 100 mg via ORAL

## 2013-08-18 MED ORDER — METOCLOPRAMIDE HCL 10 MG PO TABS: 5.0000 mg | ORAL_TABLET | Freq: Three times a day (TID) | ORAL | Status: DC | PRN

## 2013-08-18 MED ORDER — MENTHOL 3 MG MT LOZG
1.0000 | LOZENGE | OROMUCOSAL | Status: DC | PRN
  Filled 2013-08-18: qty 9

## 2013-08-18 MED ORDER — SODIUM CHLORIDE 0.9 % IJ SOLN
INTRAMUSCULAR | Status: DC | PRN
  Administered 2013-08-18: 14 mL

## 2013-08-18 MED ORDER — PROPOFOL 10 MG/ML IV BOLUS
INTRAVENOUS | Status: DC | PRN
  Administered 2013-08-18: 225 mg via INTRAVENOUS

## 2013-08-18 MED ORDER — FENTANYL CITRATE 0.05 MG/ML IJ SOLN
INTRAMUSCULAR | Status: DC | PRN
  Administered 2013-08-18: 100 ug via INTRAVENOUS
  Administered 2013-08-18: 50 ug via INTRAVENOUS
  Administered 2013-08-18: 100 ug via INTRAVENOUS
  Administered 2013-08-18 (×4): 50 ug via INTRAVENOUS

## 2013-08-18 MED ORDER — ONDANSETRON HCL 4 MG PO TABS: 4.0000 mg | ORAL_TABLET | Freq: Four times a day (QID) | ORAL | Status: DC | PRN

## 2013-08-18 MED ORDER — OXYCODONE HCL 5 MG PO TABS
5.0000 mg | ORAL_TABLET | ORAL | Status: DC
  Administered 2013-08-18 (×2): 15 mg via ORAL
  Administered 2013-08-18 – 2013-08-19 (×3): 10 mg via ORAL
  Administered 2013-08-19: 15 mg via ORAL
  Filled 2013-08-18 (×2): qty 3
  Filled 2013-08-18: qty 2
  Filled 2013-08-18 (×2): qty 3

## 2013-08-18 MED ORDER — DIPHENHYDRAMINE HCL 50 MG/ML IJ SOLN
INTRAMUSCULAR | Status: AC
  Filled 2013-08-18: qty 1

## 2013-08-18 MED ORDER — LACTATED RINGERS IV SOLN
INTRAVENOUS | Status: DC
  Administered 2013-08-18: 15:00:00 via INTRAVENOUS
  Administered 2013-08-18: 1000 mL via INTRAVENOUS

## 2013-08-18 MED ORDER — LIDOCAINE HCL (CARDIAC) 20 MG/ML IV SOLN
INTRAVENOUS | Status: AC
  Filled 2013-08-18: qty 5

## 2013-08-18 MED ORDER — DEXAMETHASONE SODIUM PHOSPHATE 10 MG/ML IJ SOLN
INTRAMUSCULAR | Status: AC
  Filled 2013-08-18: qty 1

## 2013-08-18 MED ORDER — BUPIVACAINE LIPOSOME 1.3 % IJ SUSP
20.0000 mL | Freq: Once | INTRAMUSCULAR | Status: AC
  Administered 2013-08-18: 20 mL
  Filled 2013-08-18: qty 20

## 2013-08-18 MED ORDER — FERROUS SULFATE 325 (65 FE) MG PO TABS
325.0000 mg | ORAL_TABLET | Freq: Three times a day (TID) | ORAL | Status: DC
  Administered 2013-08-19: 325 mg via ORAL
  Filled 2013-08-18 (×4): qty 1

## 2013-08-18 MED ORDER — DEXAMETHASONE SODIUM PHOSPHATE 10 MG/ML IJ SOLN
10.0000 mg | Freq: Once | INTRAMUSCULAR | Status: AC
  Administered 2013-08-18: 10 mg via INTRAVENOUS

## 2013-08-18 SURGICAL SUPPLY — 80 items
ADAPTER BOLT FEMORAL +2/-2 (Knees) ×2 IMPLANT
ADH SKN CLS APL DERMABOND .7 (GAUZE/BANDAGES/DRESSINGS) ×1
ADPR FEM +2/-2 OFST BOLT (Knees) ×1 IMPLANT
ADPR FEM 5D STRL KN PFC SGM (Orthopedic Implant) ×1 IMPLANT
AUG TIB SZ3 5 REV STP WDG STRL (Knees) ×2 IMPLANT
BAG SPEC THK2 15X12 ZIP CLS (MISCELLANEOUS) ×1
BAG ZIPLOCK 12X15 (MISCELLANEOUS) ×3 IMPLANT
BANDAGE ELASTIC 6 VELCRO ST LF (GAUZE/BANDAGES/DRESSINGS) ×3 IMPLANT
BANDAGE ESMARK 6X9 LF (GAUZE/BANDAGES/DRESSINGS) ×1 IMPLANT
BLADE SAW SGTL 13.0X1.19X90.0M (BLADE) ×3 IMPLANT
BLADE SAW SGTL 81X20 HD (BLADE) ×3 IMPLANT
BNDG CMPR 9X6 STRL LF SNTH (GAUZE/BANDAGES/DRESSINGS) ×1
BNDG ESMARK 6X9 LF (GAUZE/BANDAGES/DRESSINGS) ×3
BONE CEMENT GENTAMICIN (Cement) ×9 IMPLANT
BRUSH FEMORAL CANAL (MISCELLANEOUS) ×2 IMPLANT
CEMENT BONE GENTAMICIN 40 (Cement) ×3 IMPLANT
CEMENT RESTRICTOR DEPUY SZ 4 (Cement) ×2 IMPLANT
CUFF TOURN SGL QUICK 34 (TOURNIQUET CUFF) ×3
CUFF TRNQT CYL 34X4X40X1 (TOURNIQUET CUFF) ×1 IMPLANT
DERMABOND ADVANCED (GAUZE/BANDAGES/DRESSINGS) ×2
DERMABOND ADVANCED .7 DNX12 (GAUZE/BANDAGES/DRESSINGS) IMPLANT
DRAPE EXTREMITY T 121X128X90 (DRAPE) ×3 IMPLANT
DRAPE POUCH INSTRU U-SHP 10X18 (DRAPES) ×3 IMPLANT
DRAPE U-SHAPE 47X51 STRL (DRAPES) ×3 IMPLANT
DRSG AQUACEL AG ADV 3.5X14 (GAUZE/BANDAGES/DRESSINGS) ×2 IMPLANT
DRSG PAD ABDOMINAL 8X10 ST (GAUZE/BANDAGES/DRESSINGS) ×3 IMPLANT
DURAPREP 26ML APPLICATOR (WOUND CARE) ×3 IMPLANT
ELECT REM PT RETURN 9FT ADLT (ELECTROSURGICAL) ×3
ELECTRODE REM PT RTRN 9FT ADLT (ELECTROSURGICAL) ×1 IMPLANT
EVACUATOR 1/8 PVC DRAIN (DRAIN) ×1 IMPLANT
FACESHIELD WRAPAROUND (MASK) ×15 IMPLANT
FACESHIELD WRAPAROUND OR TEAM (MASK) ×5 IMPLANT
FEMORAL ADAPTER (Orthopedic Implant) ×2 IMPLANT
FEMUR SIGMA PS SZ 3.0 R (Femur) ×2 IMPLANT
GAUZE XEROFORM 5X9 LF (GAUZE/BANDAGES/DRESSINGS) ×3 IMPLANT
GLOVE BIOGEL PI IND STRL 7.5 (GLOVE) ×1 IMPLANT
GLOVE BIOGEL PI IND STRL 8 (GLOVE) ×1 IMPLANT
GLOVE BIOGEL PI INDICATOR 7.5 (GLOVE) ×6
GLOVE BIOGEL PI INDICATOR 8 (GLOVE) ×4
GLOVE ECLIPSE 8.0 STRL XLNG CF (GLOVE) ×5 IMPLANT
GLOVE ORTHO TXT STRL SZ7.5 (GLOVE) ×6 IMPLANT
GLOVE SURG SS PI 7.5 STRL IVOR (GLOVE) ×4 IMPLANT
GOWN SPEC L3 XXLG W/TWL (GOWN DISPOSABLE) ×5 IMPLANT
GOWN STRL REUS W/TWL 2XL LVL3 (GOWN DISPOSABLE) ×2 IMPLANT
GOWN STRL REUS W/TWL LRG LVL3 (GOWN DISPOSABLE) ×3 IMPLANT
HANDPIECE INTERPULSE COAX TIP (DISPOSABLE) ×3
IMMOBILIZER KNEE 20 (SOFTGOODS) ×2 IMPLANT
INSERT PFC SIG STB SZ3 15.0MM (Knees) ×2 IMPLANT
IV NS 1000ML (IV SOLUTION) ×6
IV NS 1000ML BAXH (IV SOLUTION) IMPLANT
KIT BASIN OR (CUSTOM PROCEDURE TRAY) ×3 IMPLANT
MANIFOLD NEPTUNE II (INSTRUMENTS) ×3 IMPLANT
NDL SAFETY ECLIPSE 18X1.5 (NEEDLE) ×1 IMPLANT
NEEDLE HYPO 18GX1.5 SHARP (NEEDLE) ×3
NS IRRIG 1000ML POUR BTL (IV SOLUTION) ×3 IMPLANT
PACK TOTAL JOINT (CUSTOM PROCEDURE TRAY) ×3 IMPLANT
PADDING CAST COTTON 6X4 STRL (CAST SUPPLIES) ×6 IMPLANT
PATELLA DOME PFC 41MM (Knees) ×2 IMPLANT
POSITIONER SURGICAL ARM (MISCELLANEOUS) ×3 IMPLANT
RESTRICTOR CEMENT SZ 5 C-STEM (Cement) ×2 IMPLANT
SET HNDPC FAN SPRY TIP SCT (DISPOSABLE) ×1 IMPLANT
SET PAD KNEE POSITIONER (MISCELLANEOUS) ×3 IMPLANT
SPONGE GAUZE 4X4 12PLY (GAUZE/BANDAGES/DRESSINGS) ×6 IMPLANT
SPONGE LAP 18X18 X RAY DECT (DISPOSABLE) ×3 IMPLANT
STAPLER VISISTAT 35W (STAPLE) IMPLANT
STEM TIBIA PFC 13X30MM (Stem) ×2 IMPLANT
SUCTION FRAZIER 12FR DISP (SUCTIONS) ×3 IMPLANT
SUT MNCRL AB 3-0 PS2 18 (SUTURE) ×2 IMPLANT
SUT VIC AB 1 CT1 36 (SUTURE) ×9 IMPLANT
SUT VIC AB 2-0 CT1 27 (SUTURE) ×12
SUT VIC AB 2-0 CT1 TAPERPNT 27 (SUTURE) ×3 IMPLANT
SYR 50ML LL SCALE MARK (SYRINGE) ×3 IMPLANT
TOWEL OR 17X26 10 PK STRL BLUE (TOWEL DISPOSABLE) ×3 IMPLANT
TOWER CARTRIDGE SMART MIX (DISPOSABLE) ×3 IMPLANT
TRAY FOLEY CATH 14FRSI W/METER (CATHETERS) ×3 IMPLANT
TRAY REVISION SZ 3 (Knees) ×2 IMPLANT
TRAY SLEEVE CEM ML (Knees) ×2 IMPLANT
WATER STERILE IRR 1500ML POUR (IV SOLUTION) ×4 IMPLANT
WEDGE SIZE 3 5MM (Knees) ×4 IMPLANT
WRAP KNEE MAXI GEL POST OP (GAUZE/BANDAGES/DRESSINGS) ×3 IMPLANT

## 2013-08-18 NOTE — Interval H&P Note (Signed)
History and Physical Interval Note:  08/18/2013 12:30 PM  Maria Holt  has presented today for surgery, with the diagnosis of infected right total knee  The various methods of treatment have been discussed with the patient and family. After consideration of risks, benefits and other options for treatment, the patient has consented to  Procedure(s): RIGHT TOTAL KNEE REIMPLANTATION AND REMOVAL OF ANTIBIOTIC SPACER (Right) as a surgical intervention .  The patient's history has been reviewed, patient examined, no change in status, stable for surgery.  I have reviewed the patient's chart and labs.  Questions were answered to the patient's satisfaction.     Mauri Pole

## 2013-08-18 NOTE — Anesthesia Preprocedure Evaluation (Addendum)
Anesthesia Evaluation  Patient identified by MRN, date of birth, ID band Patient awake    Reviewed: Allergy & Precautions, H&P , NPO status , Patient's Chart, lab work & pertinent test results  Airway Mallampati: III TM Distance: >3 FB Neck ROM: Full    Dental no notable dental hx. (+) Teeth Intact, Dental Advisory Given   Pulmonary sleep apnea ,  breath sounds clear to auscultation  Pulmonary exam normal       Cardiovascular hypertension, Pt. on medications Rhythm:Regular Rate:Normal     Neuro/Psych negative neurological ROS  negative psych ROS   GI/Hepatic negative GI ROS, Neg liver ROS, GERD-  Medicated and Controlled,  Endo/Other  negative endocrine ROSMorbid obesity  Renal/GU negative Renal ROS  negative genitourinary   Musculoskeletal negative musculoskeletal ROS (+)   Abdominal   Peds negative pediatric ROS (+)  Hematology negative hematology ROS (+)   Anesthesia Other Findings   Reproductive/Obstetrics negative OB ROS                        Anesthesia Physical Anesthesia Plan  ASA: III  Anesthesia Plan: General   Post-op Pain Management:    Induction: Intravenous  Airway Management Planned: Oral ETT  Additional Equipment:   Intra-op Plan:   Post-operative Plan: Extubation in OR  Informed Consent:   Plan Discussed with: Surgeon  Anesthesia Plan Comments:        Anesthesia Quick Evaluation

## 2013-08-18 NOTE — Anesthesia Postprocedure Evaluation (Signed)
  Anesthesia Post-op Note  Patient: Maria Holt  Procedure(s) Performed: Procedure(s) (LRB): RIGHT TOTAL KNEE REIMPLANTATION AND REMOVAL OF ANTIBIOTIC SPACER (Right)  Patient Location: PACU  Anesthesia Type: General  Level of Consciousness: awake and alert   Airway and Oxygen Therapy: Patient Spontanous Breathing  Post-op Pain: mild  Post-op Assessment: Post-op Vital signs reviewed, Patient's Cardiovascular Status Stable, Respiratory Function Stable, Patent Airway and No signs of Nausea or vomiting  Last Vitals:  Filed Vitals:   08/18/13 1630  BP: 122/67  Pulse: 70  Temp: 37.3 C  Resp: 10    Post-op Vital Signs: stable   Complications: No apparent anesthesia complications

## 2013-08-18 NOTE — Progress Notes (Signed)
Vaginal discharge Possible Yeast infection.

## 2013-08-18 NOTE — Brief Op Note (Signed)
08/18/2013  4:28 PM  PATIENT:  Maria Holt  53 y.o. female  PRE-OPERATIVE DIAGNOSIS:  Status post resection of an infected right total knee replacement with placement of antibiotic spacer  POST-OPERATIVE DIAGNOSIS:  Status post resection of an infected right total knee replacement with placement of antibiotic spacer  PROCEDURE:  Procedure(s): RIGHT TOTAL KNEE REIMPLANTATION AND REMOVAL OF ANTIBIOTIC SPACER (Right)  SURGEON:  Surgeon(s) and Role:    * Mauri Pole, MD - Primary  PHYSICIAN ASSISTANT: Molli Barrows, PA-C  ASSISTANTS: None    ANESTHESIA:   general  EBL:  Total I/O In: 2000 [I.V.:2000] Out: 525 [Urine:500; Blood:25]  BLOOD ADMINISTERED:none  DRAINS: none   LOCAL MEDICATIONS USED:  Exparel.marcaine combination  SPECIMEN:  No Specimen  DISPOSITION OF SPECIMEN:  N/A  COUNTS:  YES  TOURNIQUET:   Total Tourniquet Time Documented: Thigh (Right) - 83 minutes Total: Thigh (Right) - 83 minutes   DICTATION: .Other Dictation: Dictation Number [id number]  PLAN OF CARE: Admit to inpatient   PATIENT DISPOSITION:  PACU - hemodynamically stable.   Delay start of Pharmacological VTE agent (>24hrs) due to surgical blood loss or risk of bleeding: no

## 2013-08-18 NOTE — Transfer of Care (Signed)
Immediate Anesthesia Transfer of Care Note  Patient: Maria Holt  Procedure(s) Performed: Procedure(s): RIGHT TOTAL KNEE REIMPLANTATION AND REMOVAL OF ANTIBIOTIC SPACER (Right)  Patient Location: PACU  Anesthesia Type:General  Level of Consciousness: awake, alert , oriented and patient cooperative  Airway & Oxygen Therapy: Patient Spontanous Breathing and Patient connected to face mask oxygen  Post-op Assessment: Report given to PACU RN, Post -op Vital signs reviewed and stable and Patient moving all extremities  Post vital signs: Reviewed and stable  Complications: No apparent anesthesia complications

## 2013-08-19 ENCOUNTER — Encounter (HOSPITAL_COMMUNITY): Payer: Self-pay | Admitting: Orthopedic Surgery

## 2013-08-19 LAB — BASIC METABOLIC PANEL
BUN: 7 mg/dL (ref 6–23)
CO2: 27 meq/L (ref 19–32)
Calcium: 8.6 mg/dL (ref 8.4–10.5)
Chloride: 101 mEq/L (ref 96–112)
Creatinine, Ser: 0.66 mg/dL (ref 0.50–1.10)
GFR calc Af Amer: >90 mL/min (ref >90)
GFR calc non Af Amer: >90 mL/min (ref >90)
GLUCOSE: 133 mg/dL — AB (ref 70–99)
POTASSIUM: 4.5 meq/L (ref 3.7–5.3)
SODIUM: 139 meq/L (ref 137–147)

## 2013-08-19 LAB — CBC
HCT: 32 % — ABNORMAL LOW (ref 36.0–46.0)
HEMOGLOBIN: 10.7 g/dL — AB (ref 12.0–15.0)
MCH: 28.2 pg (ref 26.0–34.0)
MCHC: 33.4 g/dL (ref 30.0–36.0)
MCV: 84.2 fL (ref 78.0–100.0)
Platelets: 242 10*3/uL (ref 150–400)
RBC: 3.8 MIL/uL — AB (ref 3.87–5.11)
RDW: 15.2 % (ref 11.5–15.5)
WBC: 11.4 10*3/uL — ABNORMAL HIGH (ref 4.0–10.5)

## 2013-08-19 MED ORDER — POLYETHYLENE GLYCOL 3350 17 G PO PACK: 17.0000 g | PACK | Freq: Every day | ORAL | Status: DC | PRN

## 2013-08-19 MED ORDER — ACETAMINOPHEN 500 MG PO TABS: 1000.0000 mg | ORAL_TABLET | Freq: Three times a day (TID) | ORAL | Status: AC

## 2013-08-19 MED ORDER — DIPHENHYDRAMINE HCL 25 MG PO TABS: 25.0000 mg | ORAL_TABLET | Freq: Four times a day (QID) | ORAL | Status: DC | PRN

## 2013-08-19 MED ORDER — METHOCARBAMOL 500 MG PO TABS: 500.0000 mg | ORAL_TABLET | Freq: Four times a day (QID) | ORAL | Status: DC | PRN

## 2013-08-19 MED ORDER — OXYCODONE HCL 5 MG PO TABS: 5.0000 mg | ORAL_TABLET | ORAL | Status: DC

## 2013-08-19 MED ORDER — DSS 100 MG PO CAPS: 100.0000 mg | ORAL_CAPSULE | Freq: Two times a day (BID) | ORAL | Status: DC

## 2013-08-19 MED ORDER — FLUCONAZOLE 200 MG PO TABS
200.0000 mg | ORAL_TABLET | Freq: Once | ORAL | Status: AC
  Administered 2013-08-19: 200 mg via ORAL
  Filled 2013-08-19: qty 1

## 2013-08-19 MED ORDER — ASPIRIN 325 MG PO TBEC: 325.0000 mg | DELAYED_RELEASE_TABLET | Freq: Two times a day (BID) | ORAL | Status: DC

## 2013-08-19 NOTE — Progress Notes (Signed)
OT Cancellation Note  Patient Details Name: DANNELLE RHYMES MRN: [id number] DOB: Jul 21, 1960   Cancelled Treatment:    Reason Eval/Treat Not Completed: OT screened, no needs identified, will sign off. Pt reports being familiar with ADL from previous surgeries and has all DME and assist at home. She declines need for OT eval. Will sign off.  Alycia Patten [place] [phone] 08/19/2013, 9:47 AM

## 2013-08-19 NOTE — Progress Notes (Signed)
Physical Therapy Treatment Patient Details Name: Maria Holt MRN: [id number] DOB: 06-May-1960 Today's Date: 08/19/2013    History of Present Illness 53 yo female s/p R knee implantation and removal of spacer 5/4    PT Comments    2nd session. Practiced exercises, ambulation, and stair negotiation. Ready to d/c from PT standpoint.   Follow Up Recommendations  Home health PT     Equipment Recommendations  None recommended by PT    Recommendations for Other Services       Precautions / Restrictions Precautions Precautions: Knee Required Braces or Orthoses: Knee Immobilizer - Right (pt declined to wear KI) Restrictions Weight Bearing Restrictions: No Other Position/Activity Restrictions: WBAT    Mobility  Bed Mobility Overal bed mobility: Needs Assistance Bed Mobility: Supine to Sit     Supine to sit: Supervision        Transfers Overall transfer level: Needs assistance Equipment used: Rolling walker (2 wheeled) Transfers: Sit to/from Stand Sit to Stand: Supervision         General transfer comment: close guard for safety, technique, hand placement  Ambulation/Gait Ambulation/Gait assistance: Supervision Ambulation Distance (Feet): 150 Feet Assistive device: Rolling walker (2 wheeled) Gait Pattern/deviations: Decreased stride length;Antalgic     General Gait Details: close guard for safety   Stairs Stairs: Yes Stairs assistance: Supervision Stair Management: Step to pattern;Forwards;With walker;One rail Left Number of Stairs: 2 General stair comments: VCs safety.   Wheelchair Mobility    Modified Rankin (Stroke Patients Only)       Balance                                    Cognition Arousal/Alertness: Awake/alert Behavior During Therapy: WFL for tasks assessed/performed Overall Cognitive Status: Within Functional Limits for tasks assessed                      Exercises Total Joint Exercises Ankle Circles/Pumps:  AROM;Both;10 reps;Seated Quad Sets: AROM;Both;10 reps;Seated Hip ABduction/ADduction: AROM;Right;10 reps;Seated Straight Leg Raises: Right;10 reps;Seated Knee Flexion: AAROM;Right;5 reps;Seated Goniometric ROM: 5-95 degrees    General Comments        Pertinent Vitals/Pain R knee 2/10.    Home Living                      Prior Function            PT Goals (current goals can now be found in the care plan section) Acute Rehab PT Goals Patient Stated Goal: home. regain independence PT Goal Formulation: With patient Time For Goal Achievement: 08/26/13 Potential to Achieve Goals: Good Progress towards PT goals: Progressing toward goals    Frequency  7X/week    PT Plan Current plan remains appropriate    Co-evaluation             End of Session   Activity Tolerance: Patient tolerated treatment well Patient left: in chair;with call bell/phone within reach;with family/visitor present     Time: 2683-4196 PT Time Calculation (min): 17 min  Charges:  $Gait Training: 8-22 mins                    G Codes:      Weston Anna, MPT Pager: [phone]

## 2013-08-19 NOTE — Progress Notes (Signed)
Utilization review completed.  

## 2013-08-19 NOTE — Discharge Instructions (Signed)
Total Knee Replacement Care After Refer to this sheet in the next few weeks. These instructions provide you with information on caring for yourself after your procedure. Your caregiver also may give you specific instructions. Your treatment has been planned according to the most current medical practices, but problems sometimes occur. Call your caregiver if you have any problems or questions after your procedure. HOME CARE INSTRUCTIONS   See a physical therapist as directed by your caregiver.  Take over-the-counter or prescription medicines for pain, discomfort, or fever only as directed by your caregiver.  Avoid lifting or driving until you are instructed otherwise.  SEEK MEDICAL CARE IF:  You have difficulty breathing.  Your wound is red, swollen, or has become increasingly painful.  You have pus draining from your wound.  You have a bad smell coming from your wound.  You have persistent bleeding from your wound.  Your wound breaks open after sutures (stitches) or staples have been removed. SEEK IMMEDIATE MEDICAL CARE IF:   You have a fever.  You have a rash.  You have pain or swelling in your calf or thigh.  You have shortness of breath or chest pain.  Your range of motion in your knee is decreasing rather than increasing. MAKE SURE YOU:   Understand these instructions.  Will watch your condition.  Will get help right away if you are not doing well or get worse. Document Released: 10/21/2004 Document Revised: 10/03/2011 Document Reviewed: 05/23/2011 [facility] Patient Information 2014 [place].

## 2013-08-19 NOTE — Op Note (Signed)
NAMEJORDI, Maria Holt                 ACCOUNT NO.:  [id number]  MEDICAL RECORD NO.:  [id number]  LOCATION:  5366                         FACILITY:  [facility]  PHYSICIAN:  Pietro Cassis. Alvan Dame, M.D.  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  PROCEDURE: 1. Removal of antibiotic spacer. 2. Reimplantation/revision right total knee arthroplasty.  COMPONENTS USED:  DePuy rotating platform revision knee system with size 3 femur, +2 bolt, 5-degree adapter with a 13 x 30 cemented stem on the femur, size 3 MBT revision tray with a 29 cemented sleeve and 5 mm medial and lateral augments.  A size 15 rotating platform posterior stabilized insert, and a 41 patellar button.  SURGEON:  Pietro Cassis. Alvan Dame, M.D.  ASSISTANT:  Judith Part. Chabon, PA-C.  Note that, Mr. Margit Banda was present for the entirety of the case from preoperative positioning to perioperative management of the operative extremity, general facilitation of the case, primary wound closure.  ANESTHESIA:  General.  SPECIMENS:  None.  COMPLICATIONS:  None.  DRAINS:  None.  TOURNIQUET TIME:  83 minutes at 250 mmHg.  INDICATIONS FOR PROCEDURE:  Ms. Cuppett is a 53 year old female, who had been seen and evaluated for treatment of an infected right total knee. She underwent an I and D of her right knee including an abscess in the anterior proximal lateral of her leg.  She was treated with IV antibiotics.  Her wound has healed without event.  After being off antibiotics for 2 weeks, she is at this point prepared to have her right knee reimplanted with risks of recurrent infection.   Discussed the need for future surgery, her age, the effort postoperatively.  Consent was obtained for the benefit of improved functional quality of life.  PROCEDURE IN DETAIL:  The patient was brought to the operative theater. Once adequate anesthesia, preoperative antibiotics, 2 g of Ancef administered.  She was positioned supine with the right thigh tourniquet placed.  The right lower extremity was then prepped and draped in a standard fashion.  Time-out was performed identifying the patient, planned procedure, and extremity.  Right lower extremity was then prepped and draped in a sterile fashion.  The right leg was placed in a DeMayo leg holder.  The leg was exsanguinated, tourniquet elevated to 250 mmHg.  The patient's old incision was excised partially and incised up to its entire extent.  Soft tissue plane was created.  Median arthrotomy was then made.  The first portion of the case attended to synovectomy as well as scar debridement of medial and lateral gutters reestablishing normal movement to allow for patella subluxation.  Following this initial exposure, we used osteotomes to remove the articulating antibiotic laden cement spacer.  This was done without loss of bone.  Once the cement was off and grossly debrided, we hand reamed from 10 mm up to 15  mm in both the distal femur and proximal tibia.  I then used a canal brush irrigator and irrigated out the proximal femur and proximal tibia with 1 L normal saline solution with pulse lavage.  At this point, an extramedullary guide was placed on the proximal tibia. I made a minimal resection of remaining bone, cemented on this proximal tibia to make a perpendicular cut in the fresh bone surface.  This cut surface seemed to be best fit with size 3 including removal of medial based osteophytes.  It was pinned into position through the medial third of the tubercle, drilled, and then I broached to the 29 broach.  At this point, a  trial MBT tray was placed with a 29 cemented sleeve and 5 mm medial and lateral augments.  Attention was now directed to the femur femoral canal and femoral reamer, measuring 13 mm placed into the canal with a 16 mm cylindrical after opening guide.  I then placed a distal femoral cutting block over top of this and made minimal a mm or so bone resection off the distal femur with noted overall alignment at 5 degrees.  The size of the femur seemed to be best fit with a size 3, the size 3 cutting block was then pinned in position with a +2 bolt, minimal bone resection was made posterolateral, otherwise stable, no significant bone cuts there.  The final box cut was rechecked as well.  At this point in time, we removed remaining fibrous tissue or any abnormal tissues in the proximal tibia and distal femur.  Trial reduction was now carried out with a size 3 rotating platform femur with a +2 bolt 5 degree adapter, 13 x 30 stem, and the tibial tray in place, initially trialed a 12.5, then a 15 mm insert.  The knee came to full extension, was stable from extension to flexion.  The patella tracked through the trochlea without application of pressure.  With the trial components in place, the patella was everted.  After debriding soft tissues from this area, I made a minimal resection to clear off this bone and we selected to use a 41 patellar button.  Lug holes were drilled.  Again, the patella trial was noted to track through the trochlea without application of pressure.  At this point, all trial components were removed.  Final debridements were carried out as necessary.  The cement restrictor was placed deep into the distal femur, proximal tibia, as measured by the trial components.  We then irrigated the knee again with a L of normal saline solution when the final components were opened and configured on the back table under my direct supervision.  When the components were configured, the  cement was mixed.  The components were cemented into position without difficulty and knee brought to extension with a 15 mm insert.  Once the cement had fully cured, excessive cement was removed throughout the knee.  We selected the 15 mm posterior stabilized rotating platform insert, which was placed in the knee.  The knee was irrigated again at this point, the extensor mechanism was reapproximated using #1 Vicryl and 0 V-Loc suture with the knee in flexion.  The remainder of the wound was closed with 2-0 Vicryl and running 3-0 Monocryl.  The knee was cleaned, dried, and dressed sterilely using Dermabond and Aquacel dressing.  She was then brought to the recovery room in stable condition, tolerated the procedure well. Findings will be reviewed with family.  At this point, I  did not see any concern for reinfection.  Bone quality was felt to be nice.     Pietro Cassis Alvan Dame, M.D.     MDO/MEDQ  D:  08/18/2013  T:  08/19/2013  Job:  [id number]

## 2013-08-19 NOTE — Evaluation (Signed)
Physical Therapy Evaluation Patient Details Name: Maria Holt MRN: [id number] DOB: 1960/05/14 Today's Date: 08/19/2013   History of Present Illness  53 yo female s/p R knee implantation and removal of spacer 5/4  Clinical Impression  On eval, pt required Min guard for mobility-able to ambulate ~125 feet with walker. Anticipate pt will progress well during stay. Will see for a 2nd session-pt likely ready to d/c home on today.    Follow Up Recommendations Home health PT    Equipment Recommendations  None recommended by PT    Recommendations for Other Services       Precautions / Restrictions Precautions Precautions: Knee Required Braces or Orthoses: Knee Immobilizer - Right (pt declined to wear KI) Restrictions Weight Bearing Restrictions: No Other Position/Activity Restrictions: WBAT      Mobility  Bed Mobility Overal bed mobility: Needs Assistance Bed Mobility: Supine to Sit     Supine to sit: Supervision        Transfers Overall transfer level: Needs assistance Equipment used: Rolling walker (2 wheeled) Transfers: Sit to/from Stand Sit to Stand: Min guard         General transfer comment: close guard for safety, technique, hand placement  Ambulation/Gait Ambulation/Gait assistance: Min guard Ambulation Distance (Feet): 125 Feet Assistive device: Rolling walker (2 wheeled) Gait Pattern/deviations: Decreased stride length;Step-to pattern;Decreased step length - right;Antalgic     General Gait Details: close guard for safety  Stairs            Wheelchair Mobility    Modified Rankin (Stroke Patients Only)       Balance                                             Pertinent Vitals/Pain 4/10 r knee. Ice applied end of session    Home Living                        Prior Function                 Hand Dominance        Extremity/Trunk Assessment                         Communication       Cognition                            General Comments      Exercises Total Joint Exercises Ankle Circles/Pumps: AROM;Both;10 reps;Seated Quad Sets: AROM;Both;10 reps;Seated Hip ABduction/ADduction: AROM;Right;10 reps;Seated Straight Leg Raises: Right;10 reps;Seated Knee Flexion: AAROM;Right;5 reps;Seated Goniometric ROM: 5-95 degrees      Assessment/Plan    PT Assessment Patient needs continued PT services  PT Diagnosis Difficulty walking;Abnormality of gait;Acute pain   PT Problem List Decreased strength;Decreased range of motion;Decreased mobility;Obesity;Pain;Decreased activity tolerance  PT Treatment Interventions DME instruction;Gait training;Stair training;Functional mobility training;Therapeutic activities;Therapeutic exercise;Patient/family education;Balance training   PT Goals (Current goals can be found in the Care Plan section) Acute Rehab PT Goals Patient Stated Goal: home. regain independence PT Goal Formulation: With patient Time For Goal Achievement: 08/26/13 Potential to Achieve Goals: Good    Frequency 7X/week   Barriers to discharge        Co-evaluation  End of Session   Activity Tolerance: Patient tolerated treatment well Patient left: in chair;with call bell/phone within reach           Time: 0902-0922 PT Time Calculation (min): 20 min   Charges:   PT Evaluation $Initial PT Evaluation Tier I: 1 Procedure PT Treatments $Gait Training: 8-22 mins   PT G Codes:          Weston Anna, MPT Pager: [phone]

## 2013-08-19 NOTE — Discharge Summary (Signed)
Physician Discharge Summary  Patient ID: Maria Holt MRN: [id number] DOB/AGE: [birth year] 53 y.o.  Admit date: 08/18/2013 Discharge date:  08/19/2013   Procedures:  Procedure(s) (LRB): RIGHT TOTAL KNEE REIMPLANTATION AND REMOVAL OF ANTIBIOTIC SPACER (Right)  Attending Physician:  Dr. Paralee Cancel   Admission Diagnoses:   S/p right total kneereplacement revision  Discharge Diagnoses:  Active Problems:   Status post revision of total knee replacement  Past Medical History  Diagnosis Date  . GERD (gastroesophageal reflux disease)   . Hypertension   . Chronic lower back pain   . Post-traumatic osteoarthritis of right knee 1989    "tore ACL; failed reconstruction" (03/19/2012)  . De Quervain's disease (tenosynovitis)     "left hand" (03/19/2012)  . 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 OSA on CPAP 2005    CPAP-has not used in years  . Anemia     HPI: patient admitted for revision total knee replacement right knee wit perioperative antibiotics and post operative pain management, PT/OT, and DVT prophylaxis. Hospital course was uneventful. Patient discharged after PT goals met; medically stable and orthopaedically improved.   PCP: Purvis Kilts, MD   Discharged Condition: stable  Hospital Course:  Patient underwent the above stated procedure on 08/18/2013. Patient tolerated the procedure well and brought to the recovery room in good condition and subsequently to the floor.  POD #1   LABS  Basename    HGB    HCT     POD #2    LABS  Basename    HGB    HCT     POD #3    LABS   No new labs  Discharge Exam: Incision/Wound:clean and dry with intact dressing  Disposition: 06-Home-Health Care Svc with follow up in 2 weeks   Follow-up Information   Follow up with Mauri Pole, MD. Schedule an appointment as soon as possible for a visit in 2 weeks.   Specialty:  Orthopedic Surgery   Contact information:   [address] [place] [address] [phone]       Discharge Orders   Future Appointments Provider Department Dept Phone   09/03/2013 3:00 PM Truman Hayward, MD [facility] [phone]   Future Orders Complete By Expires   Call MD / Call 911  As directed    Constipation Prevention  As directed    Diet - low sodium heart healthy  As directed    Discharge instructions  As directed    Do not put a pillow under the knee. Place it under the heel.  As directed    Driving restrictions  As directed    Increase activity slowly as tolerated  As directed    Lifting restrictions  As directed    Patient may shower  As directed    TED hose  As directed         Medication List    STOP taking these medications       DAPTOmycin in sodium chloride 0.9 % 100 mL     multivitamin with minerals Tabs tablet      TAKE these medications       acetaminophen 500 MG tablet  Commonly known as:  TYLENOL  Take 2 tablets (1,000 mg total) by mouth every 8 (eight) hours.     aspirin 325 MG EC tablet  Take 1 tablet (325 mg total) by mouth 2 (two) times daily.  calcium carbonate 600 MG Tabs tablet  Commonly known as:  OS-CAL  Take 1,200 mg by mouth daily.     diphenhydrAMINE 25 MG tablet  Commonly known as:  BENADRYL  Take 1 tablet (25 mg total) by mouth every 6 (six) hours as needed.     DSS 100 MG Caps  Take 100 mg by mouth 2 (two) times daily.     ferrous sulfate 325 (65 FE) MG tablet  Take 1 tablet (325 mg total) by mouth 3 (three) times daily after meals.     lansoprazole 15 MG capsule  Commonly known as:  PREVACID  Take 15 mg by mouth daily.     lisinopril-hydrochlorothiazide 10-12.5 MG per tablet  Commonly known as:  PRINZIDE,ZESTORETIC  Take 1 tablet by mouth every morning.     methocarbamol 500 MG tablet  Commonly known as:  ROBAXIN  Take 1 tablet (500 mg total) by mouth every 6 (six) hours as needed for muscle spasms.     oxyCODONE 5 MG  immediate release tablet  Commonly known as:  Oxy IR/ROXICODONE  Take 1-3 tablets (5-15 mg total) by mouth every 4 (four) hours.     polyethylene glycol packet  Commonly known as:  MIRALAX / GLYCOLAX  Take 17 g by mouth daily as needed for mild constipation.     PROBIOTIC DAILY PO  Take 1 capsule by mouth daily.     triamcinolone cream 0.1 %  Commonly known as:  KENALOG  Apply 1 application topically as needed (Dry Skin). For eczema         Signed: Buck Mam PA-C

## 2013-08-19 NOTE — Care Management Note (Signed)
    Page 1 of 1   08/19/2013     8:50:11 AM CARE MANAGEMENT NOTE 08/19/2013  Patient:  AARIKA, MOON   Account Number:  [id number]  Date Initiated:  08/19/2013  Documentation initiated by:  [facility]  Subjective/Objective Assessment:   adm: R total knee reimplantation and Removal of Antiobiotic spacer     Action/Plan:   discharge planning   Anticipated DC Date:  08/19/2013   Anticipated DC Plan:  [place]  CM consult      [facility] Choice  HOME HEALTH   Choice offered to / List presented to:  C-1 Patient        [place] arranged  HH-2 PT      [place].   Status of service:   Medicare Important Message given?   (If response is "NO", the following Medicare IM given date fields will be blank) Date Medicare IM given:   Date Additional Medicare IM given:    Discharge Disposition:  [place]  Per UR Regulation:    If discussed at Long Length of Stay Meetings, dates discussed:    Comments:  08/19/13 08:30 CM met with pt to offer choice of home health agency.  Pt states she had [facility] for previous surgery and would like them again.  Address and contact numbers verified with pt.  No DME needed as pt states she has everything.  Referral called to [facility] rep, Kristen for HHPT. No other CM needs were communicated.  Mariane Masters, BSN, CM [phone].

## 2013-08-19 NOTE — Progress Notes (Signed)
Patient ID: Maria Holt, female   DOB: 1960-10-08, 53 y.o.   MRN: [id number]   Subjective: 1 Day Post-Op Procedure(s) (LRB): RIGHT TOTAL KNEE REIMPLANTATION AND REMOVAL OF ANTIBIOTIC SPACER (Right)   Patient reports pain as moderate.  Objective:   VITALS:   Filed Vitals:   08/19/13 0500  BP: 111/62  Pulse: 83  Temp: 98.4 F (36.9 C)  Resp: 16    Neurovascular intact Compartment soft Dressing intact  LABS  Recent Labs  08/19/13 0501  HGB 10.7*  HCT 32.0*  WBC 11.4*  PLT 242     Recent Labs  08/19/13 0501  NA 139  K 4.5  BUN 7  CREATININE 0.66  GLUCOSE 133*     Assessment/Plan: 1 Day Post-Op Procedure(s) (LRB): RIGHT TOTAL KNEE REIMPLANTATION AND REMOVAL OF ANTIBIOTIC SPACER (Right)   Advance diet Up with therapy D/C IV fluids Discharge home with home health   Buck Mam   [facility]  08/19/2013, 9:13 AM

## 2013-08-20 NOTE — Progress Notes (Signed)
Discharge summary sent to payer through [facility]  

## 2013-08-25 ENCOUNTER — Encounter: Payer: Self-pay | Admitting: Infectious Disease

## 2013-09-03 ENCOUNTER — Encounter: Payer: Self-pay | Admitting: Infectious Disease

## 2013-09-03 ENCOUNTER — Ambulatory Visit (INDEPENDENT_AMBULATORY_CARE_PROVIDER_SITE_OTHER): Payer: BC Managed Care – PPO | Admitting: Infectious Disease

## 2013-09-03 VITALS — BP 118/72 | HR 94 | Temp 99.2°F | Ht 66.0 in | Wt 238.0 lb

## 2013-09-03 DIAGNOSIS — Z96659 Presence of unspecified artificial knee joint: Secondary | ICD-10-CM

## 2013-09-03 DIAGNOSIS — T8459XA Infection and inflammatory reaction due to other internal joint prosthesis, initial encounter: Secondary | ICD-10-CM

## 2013-09-03 DIAGNOSIS — T8450XA Infection and inflammatory reaction due to unspecified internal joint prosthesis, initial encounter: Secondary | ICD-10-CM

## 2013-09-03 NOTE — Progress Notes (Signed)
Subjective:    Patient ID: Maria Holt, female    DOB: 1960-09-03, 53 y.o.   MRN: [id number]  HPI   53 year old lady with history of prosthetic left knee infection sp I and D and exchange on 04/19/12. She was seen by my partner Dr. Johnnye Sima and sent home on empiric regimen of Vancomycin, rocephin and oral rifampin. One of the knee cultures DID actually grow a CNS but this was not sent for sensitivity testing by the lab. She had developed an apparent PICC line associate infection and PICC was removed and I had placed her on doxycyline while she had no IV access. New PICC was placed and pt restarted on abx (vanco and ceftriaxone) and pt did not tolerate well with flushing, what sounds like red mans syndrome as well as paresthesias, numbness, shaking chills and 8 bowel movements.. She refused reinitiation of the vancomycin and she continued on doxycycline and rifampin. I was able to get her switched to IV daptomycin and she continued on this and oral rifampin.  I dc'd her rifampin at that time because she had multituide of other symptoms including paresthesias, Nausea etc that I thought might be due to this.    Her ESR and CRP had also come down nicely when I  saw her in February but her numbers had worsened in May of 2014, and then again improved when last checked in July. When I saw her in September 2014 her sedimentation rate again climbed into the 40s. We therefore  maintained her on doxycycline since then. She ultimately however developed worsening pain and her ESR and CRP were still up in the winter.   Dr Alvan Dame and I discussed her case and we took her off doxycyline which she remained off for nearly 3 weeks. Off doxy she developed an abscess -like swelling above her incision. She was taken back to the OR and had I and D and removal of her prosthetic joint with intraoperative cutlures taken which again DID NOT grow any organisms.   She was placed on daptomycin postoperatively and is feeling better.  She is to see Dr Alvan Dame shortly for followup. Dr Baxter Flattery had seen at [facility] and had suggested addition of carbapenem but pt never was started on this  But was   better 2 weeks into daptomycin which we extended to 6 weeks. She saw Dr. Baxter Flattery in fu and was doing well, finished abx PICC out. She then was re-assessed by Dr Alvan Dame and he performed   1. Removal of antibiotic spacer.  2. Reimplantation/revision right total knee arthroplasty.  On May 5th, 2015. She is doing very well and wound is healing. She is not having much pain except with exertion during the day. No fevers, chills or systemic symptoms.    Review of Systems  Constitutional: Negative for fever, chills, diaphoresis, activity change, appetite change, fatigue and unexpected weight change.  HENT: Negative for congestion, rhinorrhea, sinus pressure, sneezing, sore throat and trouble swallowing.   Eyes: Negative for photophobia and visual disturbance.  Respiratory: Negative for cough, chest tightness, shortness of breath, wheezing and stridor.   Cardiovascular: Negative for chest pain, palpitations and leg swelling.  Gastrointestinal: Negative for nausea, vomiting, abdominal pain, diarrhea, constipation, blood in stool, abdominal distention and anal bleeding.  Genitourinary: Negative for dysuria, hematuria, flank pain and difficulty urinating.  Musculoskeletal: Negative for back pain, gait problem, joint swelling and myalgias.  Skin: Positive for wound. Negative for color change, pallor and rash.  Neurological: Negative for  dizziness, tremors, weakness and light-headedness.  Hematological: Negative for adenopathy. Does not bruise/bleed easily.  Psychiatric/Behavioral: Negative for behavioral problems, confusion, sleep disturbance, dysphoric mood, decreased concentration and agitation. The patient is not nervous/anxious.        Objective:   Physical Exam  Constitutional: She is oriented to person, place, and time. She appears well-developed  and well-nourished. No distress.  HENT:  Head: Normocephalic and atraumatic.  Mouth/Throat: Oropharynx is clear and moist. No oropharyngeal exudate.  Eyes: Conjunctivae and EOM are normal. Pupils are equal, round, and reactive to light. No scleral icterus.  Neck: Normal range of motion. Neck supple. No JVD present.  Cardiovascular: Normal rate and regular rhythm.   Pulmonary/Chest: Effort normal. No respiratory distress. She has no wheezes.  Abdominal: She exhibits no distension. There is no rebound.  Musculoskeletal: She exhibits no edema and no tenderness.       Legs: Lymphadenopathy:    She has no cervical adenopathy.  Neurological: She is alert and oriented to person, place, and time. She exhibits normal muscle tone. Coordination normal.  Skin: Skin is warm and dry. She is not diaphoretic. No erythema. No pallor.  Psychiatric: She has a normal mood and affect. Her behavior is normal. Judgment and thought content normal.            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 I thought  likley a Gram positive organsism such as Coag NEg , or Nutirionally deficient strep, possibly diptheroid  She finished 6 weeks of therapy and now has had new knee placed  I tried to reassure her that she would do well but did caution her that infection could return or that she could develop new infection, we certainly hope for the best and her incision looks great. I think she will feel better the more months pass from surgery without incident  She can followup prn with [place] in [place]

## 2013-10-14 IMAGING — XA IR FLUORO GUIDE CV LINE*L*
1 series · 1 of 1 positions shown · non-contrast
Comparison: none

CLINICAL DATA: Recent orthopedic surgery with postop infection,
need for 6 weeks and IV antibiotics, request for PICC line.

[Series 1: run · 1 of 1 slices shown]
[im 1/1]
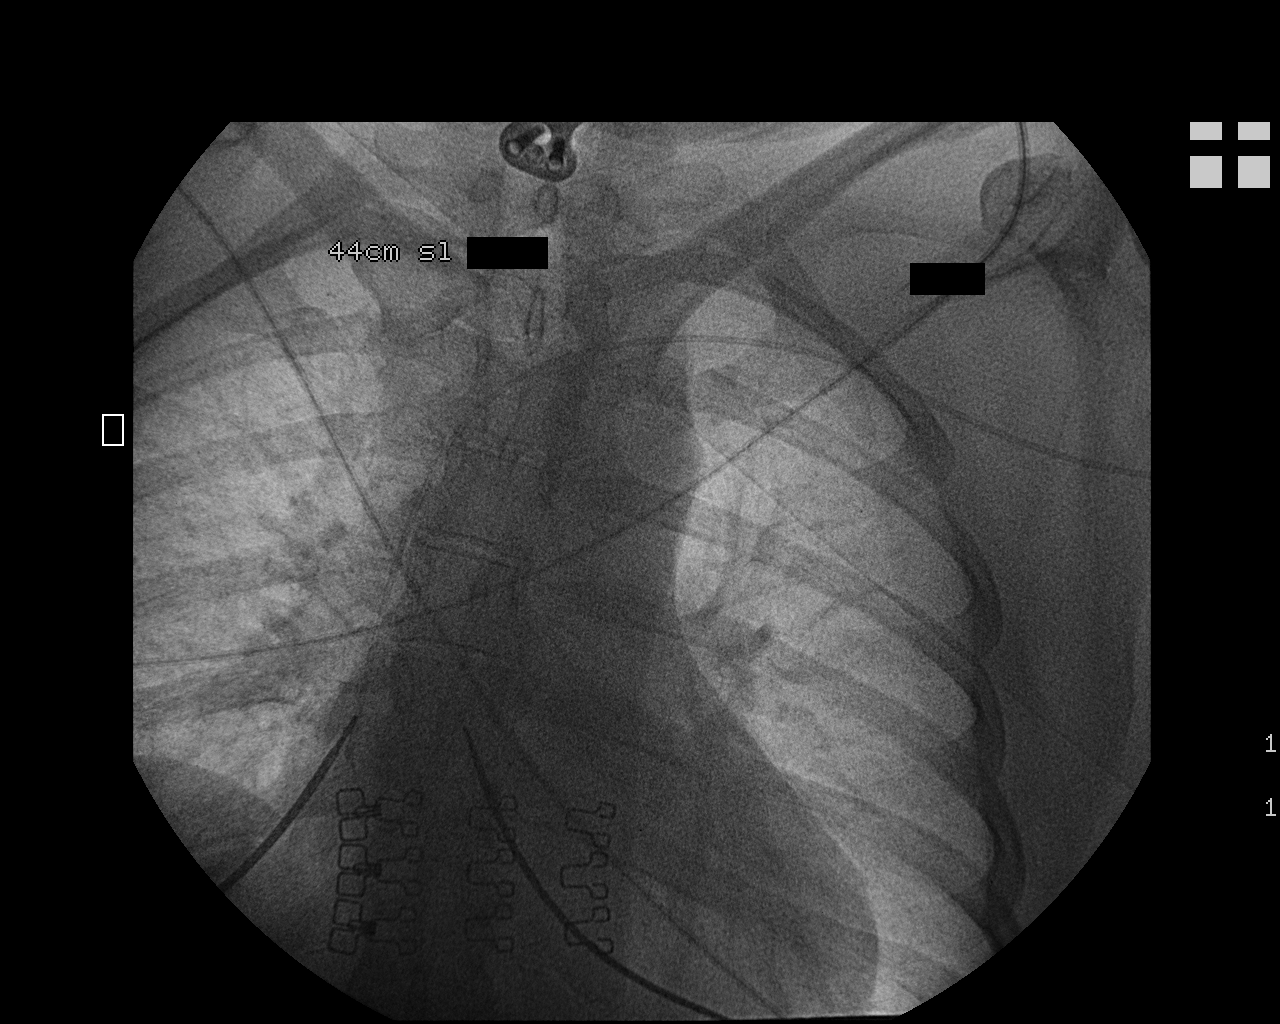

[1 of 1 positions shown; findings below may reference images not displayed]

PICC LINE PLACEMENT WITH ULTRASOUND AND FLUOROSCOPIC  GUIDANCE

Fluoroscopy Time: 0.2 minutes.

The left arm was prepped with chlorhexidine, draped in the usual
sterile fashion using maximum barrier technique (cap and mask,
sterile gown, sterile gloves, large sterile sheet, hand hygiene and
cutaneous antisepsis) and infiltrated locally with 1% Lidocaine.

Ultrasound demonstrated patency of the left basilic vein, and this
was documented with an image.  Under real-time ultrasound guidance,
this vein was accessed with a 21 gauge micropuncture needle and
image documentation was performed.  The needle was exchanged over a
guidewire for a peel-away sheath through which a five French single
lumen PICC trimmed to 44 cm was advanced, positioned with its tip
at the lower SVC/right atrial junction.  Fluoroscopy during the
procedure and fluoro spot radiograph confirms appropriate catheter
position.  The catheter was flushed, secured to the skin with
Prolene sutures, and covered with a sterile dressing.

Complications:  None
IMPRESSION: Successful left arm PICC line placement with ultrasound and
fluoroscopic guidance.  The catheter is ready for use.

Read by Kulsoom Gerhardt

## 2013-10-17 ENCOUNTER — Encounter (HOSPITAL_COMMUNITY): Payer: Self-pay | Admitting: Emergency Medicine

## 2013-10-17 ENCOUNTER — Emergency Department (HOSPITAL_COMMUNITY)
Admission: EM | Admit: 2013-10-17 | Discharge: 2013-10-17 | Disposition: A | Discharge status: Home / Self Care | Payer: BC Managed Care – PPO | Attending: Emergency Medicine | Admitting: Emergency Medicine

## 2013-10-17 DIAGNOSIS — L255 Unspecified contact dermatitis due to plants, except food: Secondary | ICD-10-CM | POA: Insufficient documentation

## 2013-10-17 DIAGNOSIS — Z9981 Dependence on supplemental oxygen: Secondary | ICD-10-CM | POA: Insufficient documentation

## 2013-10-17 DIAGNOSIS — M1990 Unspecified osteoarthritis, unspecified site: Secondary | ICD-10-CM | POA: Insufficient documentation

## 2013-10-17 DIAGNOSIS — IMO0002 Reserved for concepts with insufficient information to code with codable children: Secondary | ICD-10-CM | POA: Insufficient documentation

## 2013-10-17 DIAGNOSIS — D649 Anemia, unspecified: Secondary | ICD-10-CM | POA: Insufficient documentation

## 2013-10-17 DIAGNOSIS — K219 Gastro-esophageal reflux disease without esophagitis: Secondary | ICD-10-CM | POA: Insufficient documentation

## 2013-10-17 DIAGNOSIS — G8929 Other chronic pain: Secondary | ICD-10-CM | POA: Insufficient documentation

## 2013-10-17 DIAGNOSIS — Z79899 Other long term (current) drug therapy: Secondary | ICD-10-CM | POA: Insufficient documentation

## 2013-10-17 DIAGNOSIS — I10 Essential (primary) hypertension: Secondary | ICD-10-CM | POA: Insufficient documentation

## 2013-10-17 DIAGNOSIS — G4733 Obstructive sleep apnea (adult) (pediatric): Secondary | ICD-10-CM | POA: Insufficient documentation

## 2013-10-17 DIAGNOSIS — Z87828 Personal history of other (healed) physical injury and trauma: Secondary | ICD-10-CM | POA: Insufficient documentation

## 2013-10-17 DIAGNOSIS — Z7982 Long term (current) use of aspirin: Secondary | ICD-10-CM | POA: Insufficient documentation

## 2013-10-17 MED ORDER — PREDNISONE 50 MG PO TABS
60.0000 mg | ORAL_TABLET | Freq: Once | ORAL | Status: AC
  Administered 2013-10-17: 60 mg via ORAL
  Filled 2013-10-17 (×2): qty 1

## 2013-10-17 MED ORDER — PREDNISONE 20 MG PO TABS: ORAL_TABLET | ORAL | Status: DC

## 2013-10-17 MED ORDER — DIPHENHYDRAMINE HCL 25 MG PO CAPS
25.0000 mg | ORAL_CAPSULE | Freq: Once | ORAL | Status: AC
  Administered 2013-10-17: 25 mg via ORAL
  Filled 2013-10-17: qty 1

## 2013-10-17 NOTE — Discharge Instructions (Signed)
Poison Oak Poison oak is a rash caused by touching the leaves of the poison oak plant. You may have a rash with redness and itching. Sometimes, blisters appear and break open. Your eyes may get puffy (swollen). Poison oak often heals in 2 to 3 weeks without treatment.  HOME CARE  If you touch poison oak:  Wash your skin with soap and water right away. Wash under your fingernails. Do not rub the skin very hard.  Wash any clothes you were wearing.  Avoid poison oak in the future. Poison oak usually has 3 leaves on a stem.  Use medicines to help with itching as told by your doctor. Do not drive when you take this medicine.  Keep open sores dry, clean, and covered with a bandage and medicated cream, if needed.  Ask your doctor about medicine for children. GET HELP RIGHT AWAY IF:  You have open sores.  Redness spreads beyond the area of the rash.  There is yellowish white fluid (pus) coming from the rash.  Pain gets worse.  You have a temperature by mouth above 102 F (38.9 C), not controlled by medicine. MAKE SURE YOU:  Understand these instructions.  Will watch your condition.  Will get help right away if you are not doing well or get worse. Document Released: 05/06/2010 Document Revised: 06/26/2011 Document Reviewed: 05/06/2010 [employer] Patient Information 2015 [employer]. This information is not intended to replace advice given to you by your health care provider. Make sure you discuss any questions you have with your health care provider.  

## 2013-10-17 NOTE — ED Notes (Signed)
Rash to body after working outside , rash got better and came back with it worse this morning.

## 2013-10-19 NOTE — ED Provider Notes (Signed)
CSN: [id number]     Arrival date & time 10/17/13  1015 History   First MD Initiated Contact with Patient 10/17/13 1037     Chief Complaint  Patient presents with  . Rash     (Consider location/radiation/quality/duration/timing/severity/associated sxs/prior Treatment) Patient is a 53 y.o. female presenting with rash. The history is provided by the patient.  Rash Location:  Face, shoulder/arm and torso Facial rash location:  Face and nose Shoulder/arm rash location:  L forearm and R forearm Torso rash location:  L chest and R chest Quality: blistering, itchiness and redness   Quality: not draining, not swelling and not weeping   Severity:  Mild Onset quality:  Gradual Duration:  1 day Timing:  Constant Progression:  Worsening Chronicity:  Recurrent Context: plant contact   Relieved by:  Nothing Worsened by:  Nothing tried Ineffective treatments:  Antihistamines and anti-itch cream Associated symptoms: no abdominal pain, no diarrhea, no fever, no headaches, no hoarse voice, no induration, no joint pain, no myalgias, no nausea, no periorbital edema, no shortness of breath, no sore throat, no throat swelling, no tongue swelling, no URI, not vomiting and not wheezing     Past Medical History  Diagnosis Date  . GERD (gastroesophageal reflux disease)   . Hypertension   . Chronic lower back pain   . Post-traumatic osteoarthritis of right knee 1989    "tore ACL; failed reconstruction" (03/19/2012)  . De Quervain's disease (tenosynovitis)     "left hand" (03/19/2012)  . 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 OSA on CPAP 2005    CPAP-has not used in years  . Anemia    Past Surgical History  Procedure Laterality Date  . Anterior cruciate ligament repair  1989    "right" (03/19/2012)  . Knee arthroscopy  07/2001    RIGHT  . Anterior cervical decomp/discectomy fusion  01/2001  . Total knee arthroplasty  03/19/2012    "right" (03/19/2012)  . Total knee arthroplasty   03/19/2012    Procedure: TOTAL KNEE ARTHROPLASTY;  Surgeon: Johnny Bridge, MD;  Location: [facility];  Service: Orthopedics;  Laterality: Right;  . I&d knee with poly exchange  04/19/2012    Procedure: IRRIGATION AND DEBRIDEMENT KNEE WITH POLY EXCHANGE;  Surgeon: Johnny Bridge, MD;  Location: [place];  Service: Orthopedics;  Laterality: Right;  irrigation and debridement right knee arthroplasty with poly exhange  . Colonoscopy N/A 02/03/2013    Procedure: COLONOSCOPY;  Surgeon: Danie Binder, MD;  [facility];  Service: Endoscopy;  Laterality: N/A;  11:30 AM  . Back surgery      C 5-6 fusion  . Excisional total knee arthroplasty with antibiotic spacers Right 06/23/2013    Procedure: RIGHT RESECTION KNEE ARTHROPLASTY ;  Surgeon: Mauri Pole, MD;  Location: [facility];  Service: Orthopedics;  Laterality: Right;  . Reimplantation of total knee Right 08/18/2013    Procedure: RIGHT TOTAL KNEE REIMPLANTATION AND REMOVAL OF ANTIBIOTIC SPACER;  Surgeon: Mauri Pole, MD;  Location: [facility];  Service: Orthopedics;  Laterality: Right;   Family History  Problem Relation Age of Onset  . Coronary artery disease Father    History  Substance Use Topics  . Smoking status: Never Smoker   . Smokeless tobacco: Never Used  . Alcohol Use: No   OB History   Grav Para Term Preterm Abortions TAB SAB Ect Mult Living  Review of Systems  Constitutional: Negative for fever, chills, activity change and appetite change.  HENT: Negative for facial swelling, hoarse voice, sore throat and trouble swallowing.   Respiratory: Negative for chest tightness, shortness of breath and wheezing.   Gastrointestinal: Negative for nausea, vomiting, abdominal pain and diarrhea.  Musculoskeletal: Negative for arthralgias, myalgias, neck pain and neck stiffness.  Skin: Positive for rash. Negative for wound.  Neurological: Negative for dizziness, syncope, weakness, numbness and headaches.  All other systems  reviewed and are negative.     Allergies  Dilaudid; Vancomycin; Demerol; Morphine and related; Hibiclens; and Sulfa antibiotics  Home Medications   Prior to Admission medications   Medication Sig Start Date End Date Taking? Authorizing Provider  acetaminophen (TYLENOL) 500 MG tablet Take 2 tablets (1,000 mg total) by mouth every 8 (eight) hours. 08/19/13   Benedetto Goad, PA-C  aspirin EC 325 MG EC tablet Take 1 tablet (325 mg total) by mouth 2 (two) times daily. 08/19/13   Benedetto Goad, PA-C  calcium carbonate (OS-CAL) 600 MG TABS Take 1,200 mg by mouth daily.    Historical Provider, MD  diphenhydrAMINE (BENADRYL) 25 MG tablet Take 1 tablet (25 mg total) by mouth every 6 (six) hours as needed. 08/19/13   Benedetto Goad, PA-C  docusate sodium 100 MG CAPS Take 100 mg by mouth 2 (two) times daily. 08/19/13   Benedetto Goad, PA-C  ferrous sulfate 325 (65 FE) MG tablet Take 1 tablet (325 mg total) by mouth 3 (three) times daily after meals. 06/25/13   Lucille Passy Babish, PA-C  lansoprazole (PREVACID) 15 MG capsule Take 15 mg by mouth daily.    Historical Provider, MD  lisinopril-hydrochlorothiazide (PRINZIDE,ZESTORETIC) 10-12.5 MG per tablet Take 1 tablet by mouth every morning.     Historical Provider, MD  methocarbamol (ROBAXIN) 500 MG tablet Take 1 tablet (500 mg total) by mouth every 6 (six) hours as needed for muscle spasms. 08/19/13   Benedetto Goad, PA-C  Multiple Vitamins-Minerals (MULTIVITAMIN WITH MINERALS) tablet Take 1 tablet by mouth daily.    Historical Provider, MD  oxyCODONE (OXY IR/ROXICODONE) 5 MG immediate release tablet Take 1-3 tablets (5-15 mg total) by mouth every 4 (four) hours. 08/19/13   Benedetto Goad, PA-C  polyethylene glycol [facility] / GLYCOLAX) packet Take 17 g by mouth daily as needed for mild constipation. 08/19/13   Benedetto Goad, PA-C  predniSONE (DELTASONE) 20 MG tablet Take 3 tablets po qd x 2 days, then 2 tablets po qd x 2 days, then 1 tablet po qd x 2 days 10/17/13    Liara Holm L. Latrisha Coiro, PA-C  Probiotic Product (PROBIOTIC DAILY PO) Take 1 capsule by mouth daily.    Historical Provider, MD  triamcinolone cream (KENALOG) 0.1 % Apply 1 application topically as needed (Dry Skin). For eczema    Historical Provider, MD   BP 130/75  Pulse 93  Temp(Src) 98.7 F (37.1 C) (Oral)  Resp 16  SpO2 99%  LMP 10/05/2013 Physical Exam  Nursing note and vitals reviewed. Constitutional: She is oriented to person, place, and time. She appears well-developed and well-nourished. No distress.  HENT:  Head: Normocephalic and atraumatic.  Mouth/Throat: Uvula is midline, oropharynx is clear and moist and mucous membranes are normal. No trismus in the jaw. No uvula swelling.  Neck: Normal range of motion. Neck supple.  Cardiovascular: Normal rate, regular rhythm, normal heart sounds and intact distal pulses.   No murmur heard. Pulmonary/Chest: Effort normal and breath sounds  normal. No respiratory distress.  Musculoskeletal: She exhibits no edema and no tenderness.  Lymphadenopathy:    She has no cervical adenopathy.  Neurological: She is alert and oriented to person, place, and time. She exhibits normal muscle tone. Coordination normal.  Skin: Skin is warm. Rash noted. There is erythema.  Erythematous maculopapular rash with few vesicles in a liner pattern along the bilateral forearms, face and upper trunk.  No drainage, pustules or edema.      ED Course  Procedures (including critical care time) Labs Review Labs Reviewed - No data to display  Imaging Review No results found.   EKG Interpretation None      MDM   Final diagnoses:  Plant dermatitis    Pt agrees to tx with prednisone and benadryl.  Advised to return here if the sx's are not improving.  She agrees to plan and appears stable for d/c    Sarit Sparano L. Vanessa Wheaton, PA-C 10/19/13 1835

## 2013-10-20 NOTE — ED Provider Notes (Signed)
Medical screening examination/treatment/procedure(s) were performed by non-physician practitioner and as supervising physician I was immediately available for consultation/collaboration.   EKG Interpretation None        Sharyon Cable, MD 10/20/13 [phone]

## 2013-11-24 ENCOUNTER — Other Ambulatory Visit (HOSPITAL_COMMUNITY): Payer: Self-pay | Admitting: Family Medicine

## 2013-11-24 ENCOUNTER — Ambulatory Visit (HOSPITAL_COMMUNITY)
Admission: RE | Admit: 2013-11-24 | Discharge: 2013-11-24 | Disposition: A | Discharge status: Home / Self Care | Payer: BC Managed Care – PPO | Source: Ambulatory Visit | Attending: Family Medicine | Admitting: Family Medicine

## 2013-11-24 DIAGNOSIS — M25511 Pain in right shoulder: Secondary | ICD-10-CM

## 2013-11-24 DIAGNOSIS — M25519 Pain in unspecified shoulder: Secondary | ICD-10-CM | POA: Insufficient documentation

## 2014-02-13 ENCOUNTER — Other Ambulatory Visit (HOSPITAL_COMMUNITY): Payer: Self-pay | Admitting: Family Medicine

## 2014-02-13 DIAGNOSIS — Z1231 Encounter for screening mammogram for malignant neoplasm of breast: Secondary | ICD-10-CM

## 2014-03-05 ENCOUNTER — Ambulatory Visit (HOSPITAL_COMMUNITY): Payer: BC Managed Care – PPO

## 2014-03-11 ENCOUNTER — Ambulatory Visit (HOSPITAL_COMMUNITY)
Admission: RE | Admit: 2014-03-11 | Discharge: 2014-03-11 | Disposition: A | Discharge status: Home / Self Care | Payer: BC Managed Care – PPO | Source: Ambulatory Visit | Attending: Family Medicine | Admitting: Family Medicine

## 2014-03-11 DIAGNOSIS — Z1231 Encounter for screening mammogram for malignant neoplasm of breast: Secondary | ICD-10-CM | POA: Diagnosis present

## 2014-10-26 ENCOUNTER — Emergency Department (HOSPITAL_COMMUNITY): Payer: BC Managed Care – PPO

## 2014-10-26 ENCOUNTER — Encounter (HOSPITAL_COMMUNITY): Payer: Self-pay | Admitting: Emergency Medicine

## 2014-10-26 ENCOUNTER — Emergency Department (HOSPITAL_COMMUNITY)
Admission: EM | Admit: 2014-10-26 | Discharge: 2014-10-26 | Disposition: A | Discharge status: Home / Self Care | Payer: BC Managed Care – PPO | Attending: Emergency Medicine | Admitting: Emergency Medicine

## 2014-10-26 DIAGNOSIS — W1839XA Other fall on same level, initial encounter: Secondary | ICD-10-CM | POA: Insufficient documentation

## 2014-10-26 DIAGNOSIS — G4733 Obstructive sleep apnea (adult) (pediatric): Secondary | ICD-10-CM | POA: Diagnosis not present

## 2014-10-26 DIAGNOSIS — Z79899 Other long term (current) drug therapy: Secondary | ICD-10-CM | POA: Insufficient documentation

## 2014-10-26 DIAGNOSIS — Y9389 Activity, other specified: Secondary | ICD-10-CM | POA: Diagnosis not present

## 2014-10-26 DIAGNOSIS — G8929 Other chronic pain: Secondary | ICD-10-CM | POA: Insufficient documentation

## 2014-10-26 DIAGNOSIS — Y998 Other external cause status: Secondary | ICD-10-CM | POA: Diagnosis not present

## 2014-10-26 DIAGNOSIS — S6991XA Unspecified injury of right wrist, hand and finger(s), initial encounter: Secondary | ICD-10-CM | POA: Diagnosis present

## 2014-10-26 DIAGNOSIS — Z7982 Long term (current) use of aspirin: Secondary | ICD-10-CM | POA: Insufficient documentation

## 2014-10-26 DIAGNOSIS — K219 Gastro-esophageal reflux disease without esophagitis: Secondary | ICD-10-CM | POA: Insufficient documentation

## 2014-10-26 DIAGNOSIS — Z9981 Dependence on supplemental oxygen: Secondary | ICD-10-CM | POA: Diagnosis not present

## 2014-10-26 DIAGNOSIS — D649 Anemia, unspecified: Secondary | ICD-10-CM | POA: Insufficient documentation

## 2014-10-26 DIAGNOSIS — S6391XA Sprain of unspecified part of right wrist and hand, initial encounter: Secondary | ICD-10-CM | POA: Diagnosis not present

## 2014-10-26 DIAGNOSIS — Y9289 Other specified places as the place of occurrence of the external cause: Secondary | ICD-10-CM | POA: Diagnosis not present

## 2014-10-26 DIAGNOSIS — I10 Essential (primary) hypertension: Secondary | ICD-10-CM | POA: Diagnosis not present

## 2014-10-26 MED ORDER — IBUPROFEN 800 MG PO TABS: 800.0000 mg | ORAL_TABLET | Freq: Three times a day (TID) | ORAL | Status: DC

## 2014-10-26 MED ORDER — IBUPROFEN 800 MG PO TABS
800.0000 mg | ORAL_TABLET | Freq: Once | ORAL | Status: AC
  Administered 2014-10-26: 800 mg via ORAL
  Filled 2014-10-26: qty 1

## 2014-10-26 MED ORDER — HYDROCODONE-ACETAMINOPHEN 5-325 MG PO TABS: ORAL_TABLET | ORAL | Status: DC

## 2014-10-26 NOTE — ED Notes (Signed)
Pt verbalized understanding of no driving and to use caution within 4 hours of taking pain meds due to meds cause drowsiness 

## 2014-10-26 NOTE — Discharge Instructions (Signed)
Ligament Sprain A ligament sprain is when the bands of tissue that hold bones together (ligament) are stretched. HOME CARE   Rest the injured area.  Start using the joint when told to by your doctor.  Keep the injured area raised (elevated) above the level of the heart. This may lessen puffiness (swelling).  Put ice on the injured area.  Put ice in a plastic bag.  Place a towel between your skin and the bag.  Leave the ice on for 15-20 minutes, 03-04 times a day.  Wear a splint, cast, or an elastic bandage as told by your doctor.  Only take medicine as told by your doctor.  Use crutches as told by your doctor. Do not put weight on the injured joint until told to by your doctor. GET HELP RIGHT AWAY IF:   You have more bruising, puffiness, or pain.  The leg was injured and the toes are cold, tingling, numb, or blue.  The arm was injured and the fingers are cold, tingling, numb, or blue.  The pain is not helped with medicine.  The pain gets worse. MAKE SURE YOU:   Understand these instructions.  Will watch this condition.  Will get help right away if you are not doing well or get worse. Document Released: 09/20/2007 Document Revised: 01/22/2013 Document Reviewed: 09/20/2007 [facility] Patient Information 2015 [place], [place]. This information is not intended to replace advice given to you by your health care provider. Make sure you discuss any questions you have with your health care provider.

## 2014-10-26 NOTE — ED Notes (Signed)
Pt states that she got knocked off balance and fell while working in the garden and landed on her right hand.

## 2014-10-26 NOTE — ED Provider Notes (Signed)
CSN: [id number]     Arrival date & time 10/26/14  1633 History   This chart was scribed for non-physician practitioner Kem Parkinson, PA-C working with Ezequiel Essex, MD by Hilda Lias, ED Scribe. This patient was seen in room APFT23/APFT23 and the patient's care was started at 5:28 PM.    Chief Complaint  Patient presents with  . Hand Injury      Patient is a 54 y.o. female presenting with hand injury.  Hand Injury Associated symptoms: no fever      HPI Comments: PATRISIA Holt is a 54 y.o. female who presents to the Emergency Department complaining of pain to her right hand and wrist that began just prior to arrival after a mechanical fall , landing on her wrist.  She describes pain with movement of the hand and improves with rest.  She denies swelling, radiation of pain, numbness or weakness of the upper extremity.  She denies head injury, syncope, or other injuries.     Past Medical History  Diagnosis Date  . GERD (gastroesophageal reflux disease)   . Hypertension   . Chronic lower back pain   . Post-traumatic osteoarthritis of right knee 1989    "tore ACL; failed reconstruction" (03/19/2012)  . De Quervain's disease (tenosynovitis)     "left hand" (03/19/2012)  . 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 OSA on CPAP 2005    CPAP-has not used in years  . Anemia    Past Surgical History  Procedure Laterality Date  . Anterior cruciate ligament repair  1989    "right" (03/19/2012)  . Knee arthroscopy  07/2001    RIGHT  . Anterior cervical decomp/discectomy fusion  01/2001  . Total knee arthroplasty  03/19/2012    "right" (03/19/2012)  . Total knee arthroplasty  03/19/2012    Procedure: TOTAL KNEE ARTHROPLASTY;  Surgeon: Johnny Bridge, MD;  Location: [place];  Service: Orthopedics;  Laterality: Right;  . I&d knee with poly exchange  04/19/2012    Procedure: IRRIGATION AND DEBRIDEMENT KNEE WITH POLY EXCHANGE;  Surgeon: Johnny Bridge, MD;  Location: [place];   Service: Orthopedics;  Laterality: Right;  irrigation and debridement right knee arthroplasty with poly exhange  . Colonoscopy N/A 02/03/2013    Procedure: COLONOSCOPY;  Surgeon: Danie Binder, MD;  [facility];  Service: Endoscopy;  Laterality: N/A;  11:30 AM  . Back surgery      C 5-6 fusion  . Excisional total knee arthroplasty with antibiotic spacers Right 06/23/2013    Procedure: RIGHT RESECTION KNEE ARTHROPLASTY ;  Surgeon: Mauri Pole, MD;  Location: [facility];  Service: Orthopedics;  Laterality: Right;  . Reimplantation of total knee Right 08/18/2013    Procedure: RIGHT TOTAL KNEE REIMPLANTATION AND REMOVAL OF ANTIBIOTIC SPACER;  Surgeon: Mauri Pole, MD;  Location: [facility];  Service: Orthopedics;  Laterality: Right;   Family History  Problem Relation Age of Onset  . Coronary artery disease Father    History  Substance Use Topics  . Smoking status: Never Smoker   . Smokeless tobacco: Never Used  . Alcohol Use: No   OB History    No data available     Review of Systems  Constitutional: Negative for fever and chills.  Musculoskeletal: Positive for arthralgias (pain to right hand and wrist.). Negative for joint swelling.  Skin: Negative for color change and wound.  All other systems reviewed and are negative.     Allergies  Dilaudid;  Vancomycin; Demerol; Morphine and related; Hibiclens; and Sulfa antibiotics  Home Medications   Prior to Admission medications   Medication Sig Start Date End Date Taking? Authorizing Provider  acetaminophen (TYLENOL) 500 MG tablet Take 2 tablets (1,000 mg total) by mouth every 8 (eight) hours. 08/19/13   Allen Norris, PA-C  aspirin EC 325 MG EC tablet Take 1 tablet (325 mg total) by mouth 2 (two) times daily. 08/19/13   Allen Norris, PA-C  calcium carbonate (OS-CAL) 600 MG TABS Take 1,200 mg by mouth daily.    Historical Provider, MD  diphenhydrAMINE (BENADRYL) 25 MG tablet Take 1 tablet (25 mg total) by mouth every 6 (six) hours  as needed. 08/19/13   Allen Norris, PA-C  docusate sodium 100 MG CAPS Take 100 mg by mouth 2 (two) times daily. 08/19/13   Allen Norris, PA-C  ferrous sulfate 325 (65 FE) MG tablet Take 1 tablet (325 mg total) by mouth 3 (three) times daily after meals. 06/25/13   Danae Orleans, PA-C  lansoprazole (PREVACID) 15 MG capsule Take 15 mg by mouth daily.    Historical Provider, MD  lisinopril-hydrochlorothiazide (PRINZIDE,ZESTORETIC) 10-12.5 MG per tablet Take 1 tablet by mouth every morning.     Historical Provider, MD  methocarbamol (ROBAXIN) 500 MG tablet Take 1 tablet (500 mg total) by mouth every 6 (six) hours as needed for muscle spasms. 08/19/13   Allen Norris, PA-C  Multiple Vitamins-Minerals (MULTIVITAMIN WITH MINERALS) tablet Take 1 tablet by mouth daily.    Historical Provider, MD  oxyCODONE (OXY IR/ROXICODONE) 5 MG immediate release tablet Take 1-3 tablets (5-15 mg total) by mouth every 4 (four) hours. 08/19/13   Allen Norris, PA-C  polyethylene glycol (MIRALAX / Floria Raveling) packet Take 17 g by mouth daily as needed for mild constipation. 08/19/13   Allen Norris, PA-C  predniSONE (DELTASONE) 20 MG tablet Take 3 tablets po qd x 2 days, then 2 tablets po qd x 2 days, then 1 tablet po qd x 2 days 10/17/13   Esly Selvage, PA-C  Probiotic Product (PROBIOTIC DAILY PO) Take 1 capsule by mouth daily.    Historical Provider, MD  triamcinolone cream (KENALOG) 0.1 % Apply 1 application topically as needed (Dry Skin). For eczema    Historical Provider, MD   BP 128/73 mmHg  Pulse 71  Temp(Src) 98.4 F (36.9 C) (Oral)  Resp 16  Ht 5\' 6"  (1.676 m)  Wt 255 lb (115.667 kg)  BMI 41.18 kg/m2  SpO2 100%  LMP 10/26/2014 Physical Exam  Constitutional: She is oriented to person, place, and time. She appears well-developed and well-nourished.  HENT:  Head: Normocephalic and atraumatic.  Cardiovascular: Normal rate and regular rhythm.   Pulmonary/Chest: Effort normal. No respiratory distress.  Abdominal: She exhibits  no distension.  Musculoskeletal: She exhibits tenderness. She exhibits no edema.  ttp of the distal right wrist.  No edema or bony deformity.  Radial pulse brisk, distal sensation intact  Neurological: She is alert and oriented to person, place, and time.  Skin: Skin is warm and dry.  Psychiatric: She has a normal mood and affect.  Nursing note and vitals reviewed.   ED Course  Procedures (including critical care time)  DIAGNOSTIC STUDIES: Oxygen Saturation is 100% on room air, normal by my interpretation.    COORDINATION OF CARE: 5:27 PM Discussed treatment plan with pt at bedside and pt agreed to plan.   Labs Review Labs Reviewed - No data to display  Imaging Review Dg Hand Complete Right  10/26/2014  CLINICAL DATA:  RIGHT hand injury. Swelling and bruising of the RIGHT hand. Initial encounter.  EXAM: RIGHT HAND - COMPLETE 3+ VIEW  COMPARISON:  None.  FINDINGS: Mild STT joint and basal joint of the thumb osteoarthritis. Negative for fracture. No radiopaque foreign body. Anatomic alignment of the hand.  IMPRESSION: No acute osseous abnormality. 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   Electronically Signed   By: Dereck Ligas M.D.   On: 10/26/2014 16:56     EKG Interpretation None      MDM   Final diagnoses:  Sprain, hand, right, initial encounter    XR neg for fx.  Wrist splint applied, pain improved.  Remains NV intact.  Agrees to symptomatic tx and close ortho f/u if needed.   I personally performed the services described in this documentation, which was scribed in my presence. The recorded information has been reviewed and is accurate.    Kem Parkinson, PA-C 10/29/14 1636  Ezequiel Essex, MD 10/30/14 [phone]

## 2015-03-28 ENCOUNTER — Emergency Department (HOSPITAL_COMMUNITY)
Admission: EM | Admit: 2015-03-28 | Discharge: 2015-03-28 | Disposition: A | Discharge status: Home / Self Care | Payer: BC Managed Care – PPO | Attending: Emergency Medicine | Admitting: Emergency Medicine

## 2015-03-28 ENCOUNTER — Encounter (HOSPITAL_COMMUNITY): Payer: Self-pay | Admitting: *Deleted

## 2015-03-28 DIAGNOSIS — K219 Gastro-esophageal reflux disease without esophagitis: Secondary | ICD-10-CM | POA: Diagnosis not present

## 2015-03-28 DIAGNOSIS — Z9889 Other specified postprocedural states: Secondary | ICD-10-CM | POA: Insufficient documentation

## 2015-03-28 DIAGNOSIS — Z79899 Other long term (current) drug therapy: Secondary | ICD-10-CM | POA: Insufficient documentation

## 2015-03-28 DIAGNOSIS — Z96651 Presence of right artificial knee joint: Secondary | ICD-10-CM | POA: Diagnosis not present

## 2015-03-28 DIAGNOSIS — G4733 Obstructive sleep apnea (adult) (pediatric): Secondary | ICD-10-CM | POA: Diagnosis not present

## 2015-03-28 DIAGNOSIS — D649 Anemia, unspecified: Secondary | ICD-10-CM | POA: Insufficient documentation

## 2015-03-28 DIAGNOSIS — I10 Essential (primary) hypertension: Secondary | ICD-10-CM | POA: Diagnosis not present

## 2015-03-28 DIAGNOSIS — G8929 Other chronic pain: Secondary | ICD-10-CM | POA: Insufficient documentation

## 2015-03-28 DIAGNOSIS — M25552 Pain in left hip: Secondary | ICD-10-CM | POA: Diagnosis present

## 2015-03-28 DIAGNOSIS — Z7982 Long term (current) use of aspirin: Secondary | ICD-10-CM | POA: Diagnosis not present

## 2015-03-28 DIAGNOSIS — Z791 Long term (current) use of non-steroidal anti-inflammatories (NSAID): Secondary | ICD-10-CM | POA: Insufficient documentation

## 2015-03-28 DIAGNOSIS — M5432 Sciatica, left side: Secondary | ICD-10-CM | POA: Diagnosis not present

## 2015-03-28 MED ORDER — DIAZEPAM 5 MG/ML IJ SOLN
5.0000 mg | Freq: Once | INTRAMUSCULAR | Status: AC
Start: 2015-03-28 — End: 2015-03-28
  Administered 2015-03-28: 5 mg via INTRAMUSCULAR
  Filled 2015-03-28: qty 2

## 2015-03-28 MED ORDER — ACETAMINOPHEN 500 MG PO TABS
1000.0000 mg | ORAL_TABLET | Freq: Once | ORAL | Status: AC
  Administered 2015-03-28: 1000 mg via ORAL
  Filled 2015-03-28: qty 2

## 2015-03-28 MED ORDER — CYCLOBENZAPRINE HCL 5 MG PO TABS: 5.0000 mg | ORAL_TABLET | Freq: Three times a day (TID) | ORAL | Status: DC | PRN

## 2015-03-28 MED ORDER — PREDNISONE 20 MG PO TABS: ORAL_TABLET | ORAL | Status: DC

## 2015-03-28 MED ORDER — TRAMADOL HCL 50 MG PO TABS
100.0000 mg | ORAL_TABLET | Freq: Once | ORAL | Status: AC
  Administered 2015-03-28: 100 mg via ORAL
  Filled 2015-03-28: qty 2

## 2015-03-28 MED ORDER — TRAMADOL HCL 50 MG PO TABS: 100.0000 mg | ORAL_TABLET | Freq: Four times a day (QID) | ORAL | Status: DC | PRN

## 2015-03-28 MED ORDER — DEXAMETHASONE SODIUM PHOSPHATE 10 MG/ML IJ SOLN
10.0000 mg | Freq: Once | INTRAMUSCULAR | Status: AC
  Administered 2015-03-28: 10 mg via INTRAMUSCULAR
  Filled 2015-03-28: qty 1

## 2015-03-28 NOTE — Discharge Instructions (Signed)
Use ice and heat on your back. Take the medications as prescribed. Take acetaminophen 1000 mg 4 times a day with the tramadol and when it is finished.  Follow up with Dr Annette Stable, a neurosurgeon, or [place] if you aren't improving in the next several days.    Sciatica Sciatica is pain, weakness, numbness, or tingling along your sciatic nerve. The nerve starts in the lower back and runs down the back of each leg. Nerve damage or certain conditions pinch or put pressure on the sciatic nerve. This causes the pain, weakness, and other discomforts of sciatica. HOME CARE   Only take medicine as told by your doctor.  Apply ice to the affected area for 20 minutes. Do this 3-4 times a day for the first 48-72 hours. Then try heat in the same way.  Exercise, stretch, or do your usual activities if these do not make your pain worse.  Go to physical therapy as told by your doctor.  Keep all doctor visits as told.  Do not wear high heels or shoes that are not supportive.  Get a firm mattress if your mattress is too soft to lessen pain and discomfort. GET HELP RIGHT AWAY IF:   You cannot control when you poop (bowel movement) or pee (urinate).  You have more weakness in your lower back, lower belly (pelvis), butt (buttocks), or legs.  You have redness or puffiness (swelling) of your back.  You have a burning feeling when you pee.  You have pain that gets worse when you lie down.  You have pain that wakes you from your sleep.  Your pain is worse than past pain.  Your pain lasts longer than 4 weeks.  You are suddenly losing weight without reason. MAKE SURE YOU:   Understand these instructions.  Will watch this condition.  Will get help right away if you are not doing well or get worse.   This information is not intended to replace advice given to you by your health care provider. Make sure you discuss any questions you have with your health care provider.   Document  Released: 01/11/2008 Document Revised: 12/23/2014 Document Reviewed: 08/13/2011 Elsevier Interactive Patient Education [employer].

## 2015-03-28 NOTE — ED Notes (Signed)
Pt reports increased pain to her left hip. Did do a lot of walking while on vacation this week.

## 2015-03-28 NOTE — ED Provider Notes (Signed)
CSN: [id number]     Arrival date & time 03/28/15  0133 History   First MD Initiated Contact with Patient 03/28/15 0225    Chief Complaint  Patient presents with  . Hip Pain     (Consider location/radiation/quality/duration/timing/severity/associated sxs/prior Treatment) HPI patient reports she has chronic low back pain. She states she sees a chiropractor who diagnosed her for bulging disks on her x-rays. She states normally she has pain in her lower back. She states it usually is controlled with ibuprofen. Sometimes it gets sharper. She was seen a week ago by her PCP with pain in her left foot and was diagnosed with plantar fasciitis. Her ibuprofen was stopped and she was placed on Mobic. She reports they spent this past week in [place], [place]  where they had to do a lot of walking. She started getting pain in her left buttock a couple days ago and then in her left posterior thigh today. She denies any numbness in her lower extremity but states she has tingling in her left hand. Denies any urinary or rectal incontinence. She states she had this similar pain once before however at that time the pain radiated all the way down her leg and she was diagnosed with sciatica.  PCP Dr Hilma Favors  Past Medical History  Diagnosis Date  . GERD (gastroesophageal reflux disease)   . Hypertension   . Chronic lower back pain   . Post-traumatic osteoarthritis of right knee 1989    "tore ACL; failed reconstruction" (03/19/2012)  . De Quervain's disease (tenosynovitis)     "left hand" (03/19/2012)  . Infection of total right knee replacement ([place]) 04/19/2012  . OSA on CPAP 2005    CPAP-has not used in years  . Anemia    Past Surgical History  Procedure Laterality Date  . Anterior cruciate ligament repair  1989    "right" (03/19/2012)  . Knee arthroscopy  07/2001    RIGHT  . Anterior cervical decomp/discectomy fusion  01/2001  . Total knee arthroplasty  03/19/2012    "right" (03/19/2012)  . Total knee  arthroplasty  03/19/2012    Procedure: TOTAL KNEE ARTHROPLASTY;  Surgeon: Johnny Bridge, MD;  Location: [place];  Service: Orthopedics;  Laterality: Right;  . I&d knee with poly exchange  04/19/2012    Procedure: IRRIGATION AND DEBRIDEMENT KNEE WITH POLY EXCHANGE;  Surgeon: Johnny Bridge, MD;  Location: [place];  Service: Orthopedics;  Laterality: Right;  irrigation and debridement right knee arthroplasty with poly exhange  . Colonoscopy N/A 02/03/2013    Procedure: COLONOSCOPY;  Surgeon: Danie Binder, MD;  [facility];  Service: Endoscopy;  Laterality: N/A;  11:30 AM  . Back surgery      C 5-6 fusion  . Excisional total knee arthroplasty with antibiotic spacers Right 06/23/2013    Procedure: RIGHT RESECTION KNEE ARTHROPLASTY ;  Surgeon: Mauri Pole, MD;  [facility];  Service: Orthopedics;  Laterality: Right;  . Reimplantation of total knee Right 08/18/2013    Procedure: RIGHT TOTAL KNEE REIMPLANTATION AND REMOVAL OF ANTIBIOTIC SPACER;  Surgeon: Mauri Pole, MD;  [facility];  Service: Orthopedics;  Laterality: Right;   Family History  Problem Relation Age of Onset  . Coronary artery disease Father    Social History  Substance Use Topics  . Smoking status: Never Smoker   . Smokeless tobacco: Never Used  . Alcohol Use: No   employed  OB History    No data available  Review of Systems  All other systems reviewed and are negative.     Allergies  Dilaudid; Vancomycin; Demerol; Morphine and related; Hibiclens; and Sulfa antibiotics  Home Medications   Prior to Admission medications   Medication Sig Start Date End Date Taking? Authorizing Provider  acetaminophen (TYLENOL) 500 MG tablet Take 2 tablets (1,000 mg total) by mouth every 8 (eight) hours. 08/19/13  Yes Allen Norris, PA-C  antiseptic oral rinse (BIOTENE) LIQD 15 mLs by Mouth Rinse route as needed for dry mouth.   Yes Historical Provider, MD  aspirin EC 325 MG EC tablet Take 1 tablet (325 mg  total) by mouth 2 (two) times daily. 08/19/13  Yes Allen Norris, PA-C  calcium carbonate (OS-CAL) 600 MG TABS Take 1,200 mg by mouth daily.   Yes Historical Provider, MD  diphenhydrAMINE (BENADRYL) 25 MG tablet Take 1 tablet (25 mg total) by mouth every 6 (six) hours as needed. 08/19/13  Yes Allen Norris, PA-C  docusate sodium 100 MG CAPS Take 100 mg by mouth 2 (two) times daily. 08/19/13  Yes Allen Norris, PA-C  ferrous sulfate 325 (65 FE) MG tablet Take 1 tablet (325 mg total) by mouth 3 (three) times daily after meals. 06/25/13  Yes Danae Orleans, PA-C  HYDROcodone-acetaminophen (NORCO/VICODIN) 5-325 MG per tablet Take one-two tabs po q 4-6 hrs prn pain 10/26/14  Yes Tammy Triplett, PA-C  ibuprofen (ADVIL,MOTRIN) 800 MG tablet Take 1 tablet (800 mg total) by mouth 3 (three) times daily. Take with food 10/26/14  Yes Tammy Triplett, PA-C  lansoprazole (PREVACID) 15 MG capsule Take 15 mg by mouth daily.   Yes Historical Provider, MD  lisinopril-hydrochlorothiazide (PRINZIDE,ZESTORETIC) 10-12.5 MG per tablet Take 1 tablet by mouth every morning.    Yes Historical Provider, MD  methocarbamol (ROBAXIN) 500 MG tablet Take 1 tablet (500 mg total) by mouth every 6 (six) hours as needed for muscle spasms. 08/19/13  Yes Allen Norris, PA-C  Multiple Vitamins-Minerals (MULTIVITAMIN WITH MINERALS) tablet Take 1 tablet by mouth daily.   Yes Historical Provider, MD  oxyCODONE (OXY IR/ROXICODONE) 5 MG immediate release tablet Take 1-3 tablets (5-15 mg total) by mouth every 4 (four) hours. 08/19/13  Yes Allen Norris, PA-C  polyethylene glycol (MIRALAX / GLYCOLAX) packet Take 17 g by mouth daily as needed for mild constipation. 08/19/13  Yes Allen Norris, PA-C  Probiotic Product (PROBIOTIC DAILY PO) Take 1 capsule by mouth daily.   Yes Historical Provider, MD  triamcinolone cream (KENALOG) 0.1 % Apply 1 application topically as needed (Dry Skin). For eczema   Yes Historical Provider, MD  vitamin E 400 UNIT capsule Take 400 Units  by mouth daily.   Yes Historical Provider, MD  cyclobenzaprine (FLEXERIL) 5 MG tablet Take 1 tablet (5 mg total) by mouth 3 (three) times daily as needed. 03/28/15   Rolland Porter, MD  predniSONE (DELTASONE) 20 MG tablet Take 3 po QD x 3d , then 2 po QD x 3d then 1 po QD x 3d 03/28/15   Rolland Porter, MD  traMADol (ULTRAM) 50 MG tablet Take 2 tablets (100 mg total) by mouth every 6 (six) hours as needed. 03/28/15   Rolland Porter, MD   BP 139/84 mmHg  Pulse 85  Temp(Src) 98.2 F (36.8 C) (Oral)  Resp 24  Ht 5' 5.5" (1.664 m)  Wt 255 lb (115.667 kg)  BMI 41.77 kg/m2  SpO2 98%  LMP 03/09/2015  Vital signs normal    Physical Exam  Constitutional: She is oriented to person, place,  and time. She appears well-developed and well-nourished.  Non-toxic appearance. She does not appear ill. No distress.  HENT:  Head: Normocephalic and atraumatic.  Right Ear: External ear normal.  Left Ear: External ear normal.  Nose: Nose normal. No mucosal edema or rhinorrhea.  Mouth/Throat: Oropharynx is clear and moist and mucous membranes are normal. No dental abscesses or uvula swelling.  Eyes: Conjunctivae and EOM are normal. Pupils are equal, round, and reactive to light.  Neck: Normal range of motion and full passive range of motion without pain. Neck supple.  Cardiovascular: Normal rate, regular rhythm and normal heart sounds.  Exam reveals no gallop and no friction rub.   No murmur heard. Pulmonary/Chest: Effort normal and breath sounds normal. No respiratory distress. She has no wheezes. She has no rhonchi. She has no rales. She exhibits no tenderness and no crepitus.  Abdominal: Normal appearance.  Musculoskeletal: Normal range of motion. She exhibits no edema or tenderness.       Back:  Moves all extremities well.  Patellar reflexes 2+ and equal bilaterally. Straight leg raising is negative bilaterally.  Neurological: She is alert and oriented to person, place, and time. She has normal strength. No  cranial nerve deficit.  Skin: Skin is warm, dry and intact. No rash noted. No erythema. No pallor.  Psychiatric: She has a normal mood and affect. Her speech is normal and behavior is normal. Her mood appears not anxious.  Nursing note and vitals reviewed.   ED Course  Procedures (including critical care time)  Medications  dexamethasone (DECADRON) injection 10 mg (not administered)  diazepam (VALIUM) injection 5 mg (not administered)  traMADol (ULTRAM) tablet 100 mg (not administered)  acetaminophen (TYLENOL) tablet 1,000 mg (not administered)      MDM  patient presents with pain consistent with sciatica. She has no alarming neurological symptoms such as incontinence. She was treated with steroids and referred to neurosurgery or orthopedics.    Final diagnoses:  Sciatica, left    New Prescriptions   CYCLOBENZAPRINE (FLEXERIL) 5 MG TABLET    Take 1 tablet (5 mg total) by mouth 3 (three) times daily as needed.   PREDNISONE (DELTASONE) 20 MG TABLET    Take 3 po QD x 3d , then 2 po QD x 3d then 1 po QD x 3d   TRAMADOL (ULTRAM) 50 MG TABLET    Take 2 tablets (100 mg total) by mouth every 6 (six) hours as needed.    Plan discharge  Rolland Porter, MD, Barbette Or, MD 03/28/15 [phone]

## 2016-01-27 ENCOUNTER — Other Ambulatory Visit (HOSPITAL_COMMUNITY): Payer: Self-pay | Admitting: Family Medicine

## 2016-01-27 DIAGNOSIS — Z1231 Encounter for screening mammogram for malignant neoplasm of breast: Secondary | ICD-10-CM

## 2016-01-28 ENCOUNTER — Ambulatory Visit (HOSPITAL_COMMUNITY)
Admission: RE | Admit: 2016-01-28 | Discharge: 2016-01-28 | Disposition: A | Discharge status: Home / Self Care | Payer: BC Managed Care – PPO | Source: Ambulatory Visit | Attending: Family Medicine | Admitting: Family Medicine

## 2016-01-28 DIAGNOSIS — Z1231 Encounter for screening mammogram for malignant neoplasm of breast: Secondary | ICD-10-CM

## 2017-01-04 ENCOUNTER — Other Ambulatory Visit (HOSPITAL_COMMUNITY): Payer: Self-pay | Admitting: Family Medicine

## 2017-01-04 DIAGNOSIS — Z1231 Encounter for screening mammogram for malignant neoplasm of breast: Secondary | ICD-10-CM

## 2017-01-29 ENCOUNTER — Ambulatory Visit (HOSPITAL_COMMUNITY)
Admission: RE | Admit: 2017-01-29 | Discharge: 2017-01-29 | Disposition: A | Discharge status: Home / Self Care | Payer: BC Managed Care – PPO | Source: Ambulatory Visit | Attending: Family Medicine | Admitting: Family Medicine

## 2017-01-29 DIAGNOSIS — Z1231 Encounter for screening mammogram for malignant neoplasm of breast: Secondary | ICD-10-CM | POA: Insufficient documentation

## 2018-01-08 ENCOUNTER — Other Ambulatory Visit: Payer: Self-pay | Admitting: Neurosurgery

## 2018-01-08 DIAGNOSIS — M4310 Spondylolisthesis, site unspecified: Secondary | ICD-10-CM

## 2018-01-08 DIAGNOSIS — M542 Cervicalgia: Secondary | ICD-10-CM

## 2018-01-16 ENCOUNTER — Other Ambulatory Visit (HOSPITAL_COMMUNITY): Payer: Self-pay | Admitting: Family Medicine

## 2018-01-16 DIAGNOSIS — Z1231 Encounter for screening mammogram for malignant neoplasm of breast: Secondary | ICD-10-CM

## 2018-01-19 ENCOUNTER — Ambulatory Visit
Admission: RE | Admit: 2018-01-19 | Discharge: 2018-01-19 | Disposition: A | Discharge status: Home / Self Care | Payer: BC Managed Care – PPO | Source: Ambulatory Visit | Attending: Neurosurgery | Admitting: Neurosurgery

## 2018-01-19 DIAGNOSIS — M542 Cervicalgia: Secondary | ICD-10-CM

## 2018-01-19 DIAGNOSIS — M4310 Spondylolisthesis, site unspecified: Secondary | ICD-10-CM

## 2018-02-01 ENCOUNTER — Ambulatory Visit (HOSPITAL_COMMUNITY)
Admission: RE | Admit: 2018-02-01 | Discharge: 2018-02-01 | Disposition: A | Discharge status: Home / Self Care | Payer: BC Managed Care – PPO | Source: Ambulatory Visit | Attending: Family Medicine | Admitting: Family Medicine

## 2018-02-01 ENCOUNTER — Encounter (HOSPITAL_COMMUNITY): Payer: Self-pay | Admitting: Radiology

## 2018-02-01 DIAGNOSIS — Z1231 Encounter for screening mammogram for malignant neoplasm of breast: Secondary | ICD-10-CM

## 2019-02-07 ENCOUNTER — Other Ambulatory Visit (HOSPITAL_COMMUNITY): Payer: Self-pay | Admitting: Family Medicine

## 2019-02-07 DIAGNOSIS — Z1231 Encounter for screening mammogram for malignant neoplasm of breast: Secondary | ICD-10-CM

## 2019-02-20 ENCOUNTER — Ambulatory Visit (HOSPITAL_COMMUNITY)
Admission: RE | Admit: 2019-02-20 | Discharge: 2019-02-20 | Disposition: A | Discharge status: Home / Self Care | Payer: BC Managed Care – PPO | Source: Ambulatory Visit | Attending: Family Medicine | Admitting: Family Medicine

## 2019-02-20 ENCOUNTER — Other Ambulatory Visit: Payer: Self-pay

## 2019-02-20 DIAGNOSIS — Z1231 Encounter for screening mammogram for malignant neoplasm of breast: Secondary | ICD-10-CM

## 2019-04-07 ENCOUNTER — Ambulatory Visit: Payer: BC Managed Care – PPO | Attending: Internal Medicine

## 2019-04-07 ENCOUNTER — Other Ambulatory Visit: Payer: Self-pay

## 2019-04-07 DIAGNOSIS — Z20822 Contact with and (suspected) exposure to covid-19: Secondary | ICD-10-CM

## 2019-04-08 LAB — NOVEL CORONAVIRUS, NAA: SARS-CoV-2, NAA: NOT DETECTED

## 2019-07-15 IMAGING — MG DIGITAL SCREENING BILATERAL MAMMOGRAM WITH TOMO AND CAD
6 of 10 series · 6 of 30 positions shown · non-contrast
Comparison: Previous exam(s).

ACR Breast Density Category a: The breast tissue is almost entirely
fatty.

CLINICAL DATA: Screening.

EXAM:
DIGITAL SCREENING BILATERAL MAMMOGRAM WITH TOMO AND CAD

[R CC synth-2D]
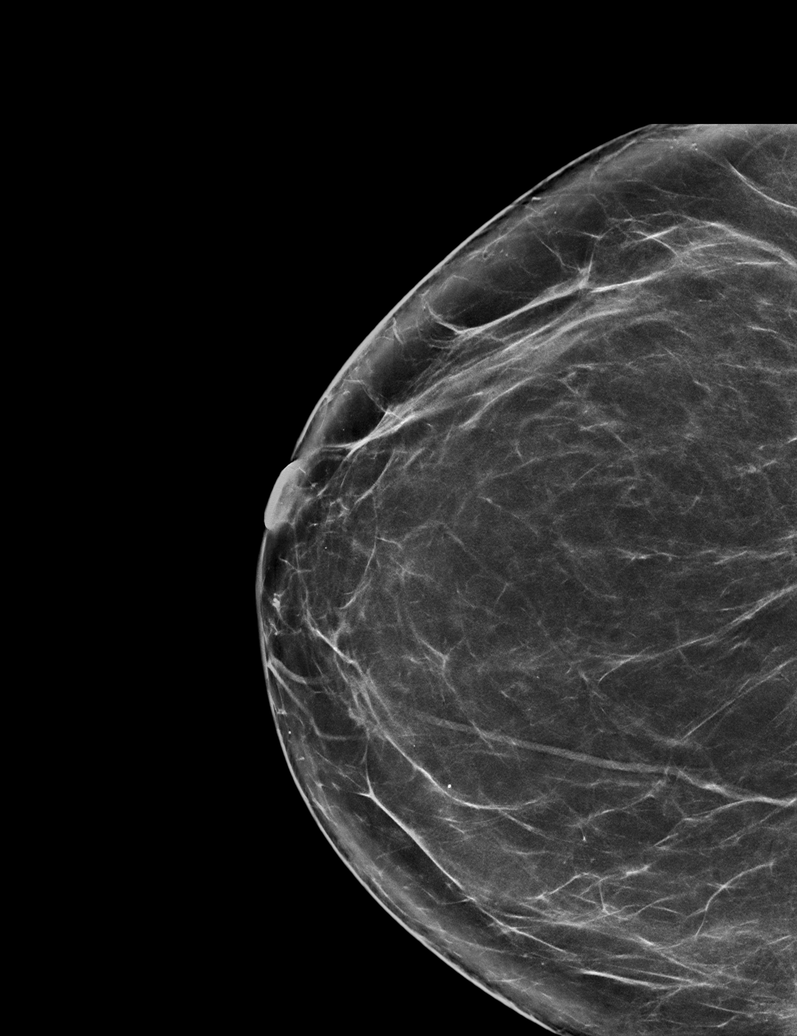

[R MLO synth-2D (1 of 2)]
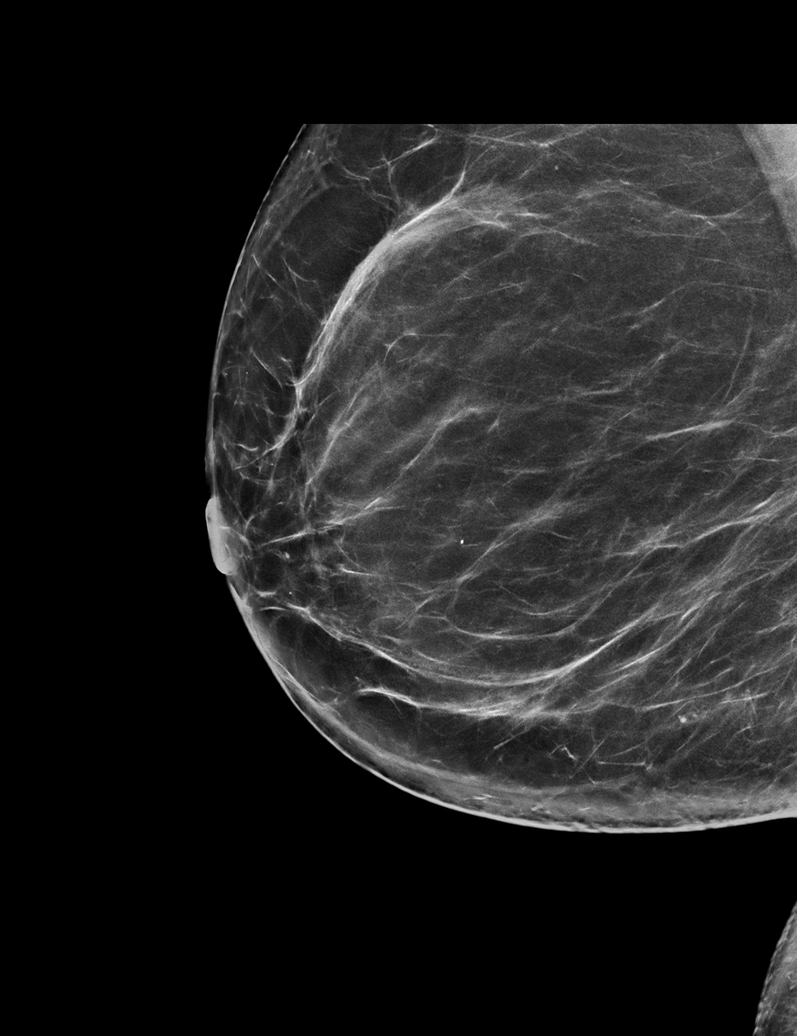

[R MLO synth-2D (2 of 2)]
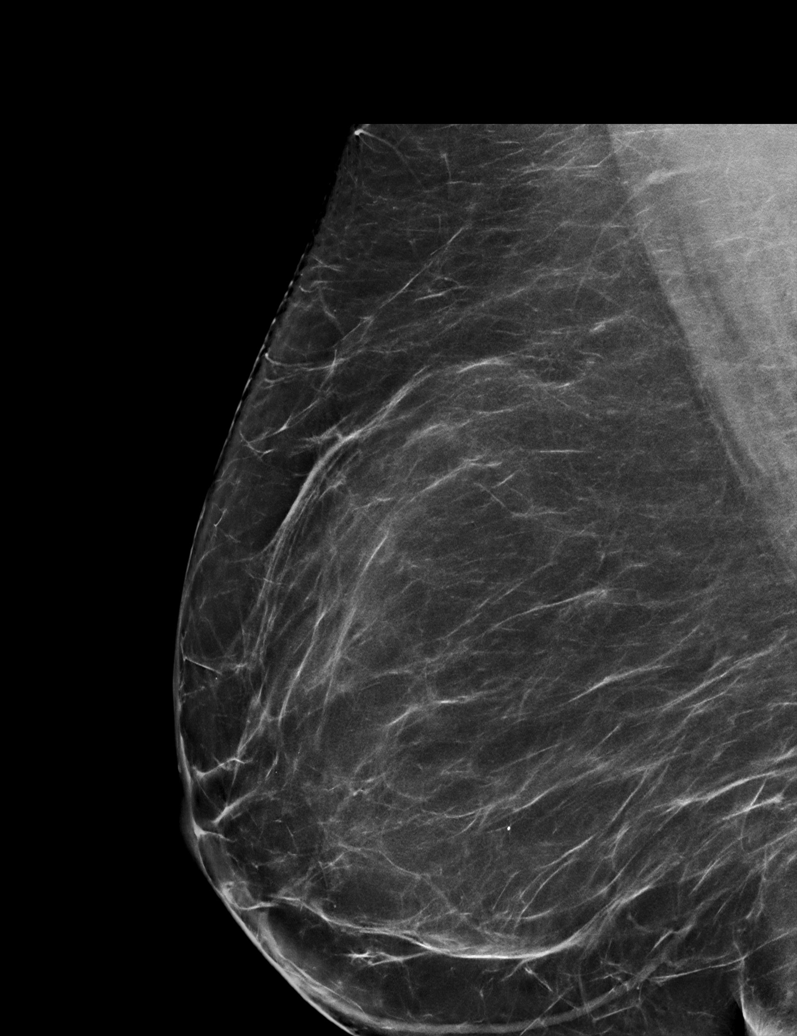

[L CC synth-2D]
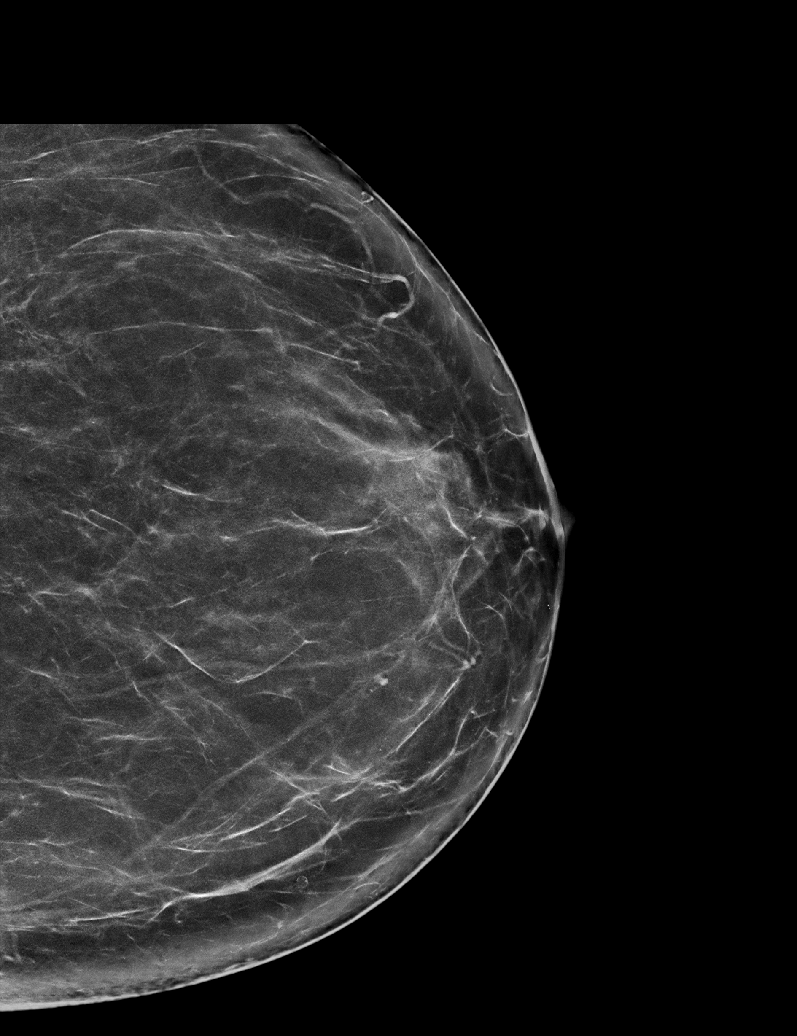

[L MLO synth-2D]
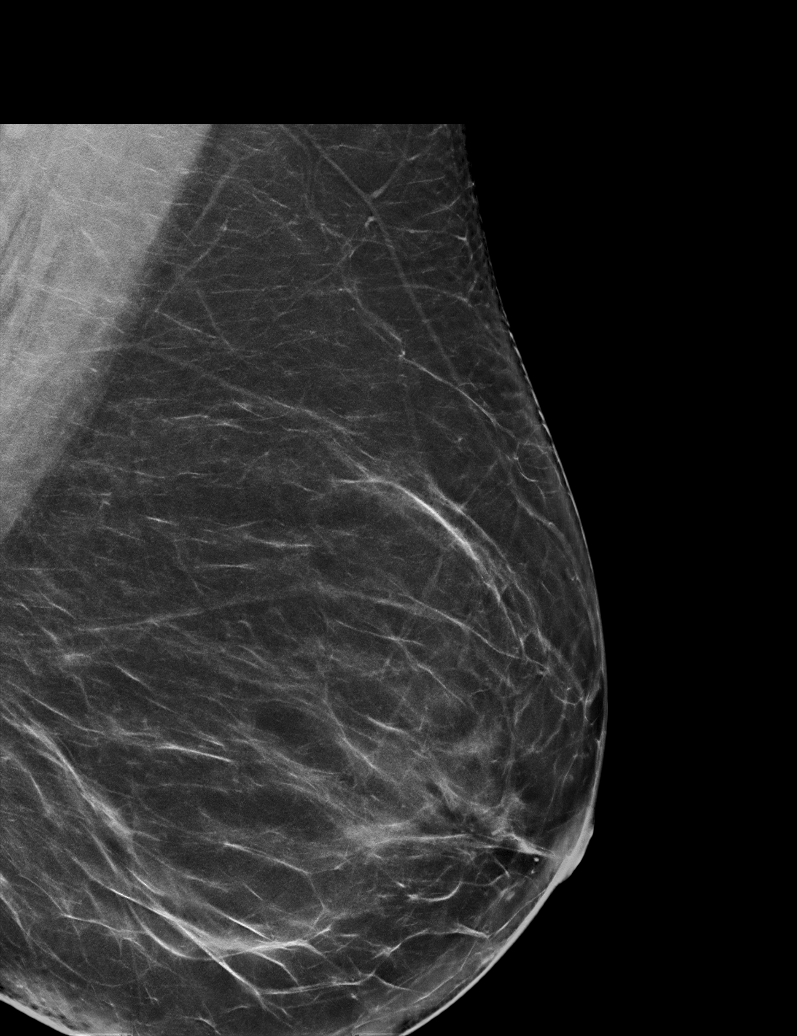

[R MLO tomo · tomo slice 41/82.0]
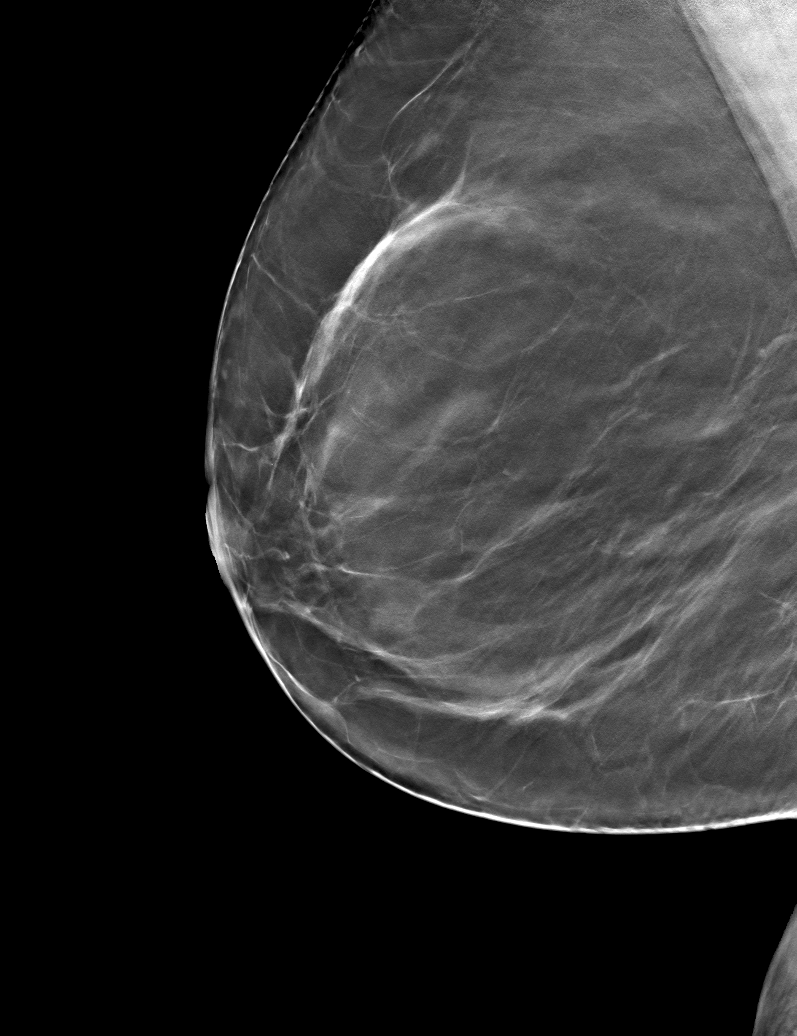

[6 of 30 positions shown; findings below may reference images not displayed]

FINDINGS: There are no findings suspicious for malignancy. Images were
processed with CAD.
IMPRESSION: No mammographic evidence of malignancy. A result letter of this
screening mammogram will be mailed directly to the patient.

RECOMMENDATION:
Screening mammogram in one year. (Code:[id number]-[id number])

BI-RADS CATEGORY  1: Negative.

## 2019-08-13 ENCOUNTER — Encounter: Payer: Self-pay | Admitting: Gastroenterology

## 2019-08-28 ENCOUNTER — Other Ambulatory Visit: Payer: Self-pay

## 2019-08-28 ENCOUNTER — Encounter: Payer: Self-pay | Admitting: Gastroenterology

## 2019-08-28 ENCOUNTER — Ambulatory Visit (INDEPENDENT_AMBULATORY_CARE_PROVIDER_SITE_OTHER): Payer: BC Managed Care – PPO | Admitting: Gastroenterology

## 2019-08-28 DIAGNOSIS — K529 Noninfective gastroenteritis and colitis, unspecified: Secondary | ICD-10-CM | POA: Diagnosis not present

## 2019-08-28 DIAGNOSIS — R399 Unspecified symptoms and signs involving the genitourinary system: Secondary | ICD-10-CM | POA: Diagnosis not present

## 2019-08-28 DIAGNOSIS — R1013 Epigastric pain: Secondary | ICD-10-CM | POA: Insufficient documentation

## 2019-08-28 LAB — URINALYSIS W MICROSCOPIC + REFLEX CULTURE
Bilirubin Urine: NEGATIVE
Glucose, UA: NEGATIVE
Hgb urine dipstick: NEGATIVE
Ketones, ur: NEGATIVE
Leukocyte Esterase: NEGATIVE
Nitrites, Initial: NEGATIVE
Protein, ur: NEGATIVE
Specific Gravity, Urine: 1.02 (ref 1.001–1.03)
pH: 7 (ref 5.0–8.0)

## 2019-08-28 LAB — NO CULTURE INDICATED

## 2019-08-28 MED ORDER — PANTOPRAZOLE SODIUM 40 MG PO TBEC: 40.0000 mg | DELAYED_RELEASE_TABLET | Freq: Every day | ORAL | 3 refills | Status: DC

## 2019-08-28 NOTE — Patient Instructions (Signed)
Please complete urine sample today. When you are able, please collect the stool samples and return to the lab.  Call me tomorrow before noon if you have worsening urinary symptoms.  Let's stop Prevacid. Instead, start Protonix. Take this 30 minutes before breakfast daily on an empty stomach. If no improvement in the next 1-2 weeks, please call, as we may need to do an endoscopy.  Keep appointment for MRI, and I will follow-up on results.  We will see you in 3 months regardless!  It was a pleasure to see you today. I want to create trusting relationships with patients to provide genuine, compassionate, and quality care. I value your feedback. If you receive a survey regarding your visit,  I greatly appreciate you taking time to fill this out.   Annitta Needs, PhD, ANP-BC [facility]

## 2019-08-28 NOTE — Progress Notes (Signed)
Primary Care Physician:  Sharilyn Sites, MD  Referring Physician: Dr. Gerarda Fraction Primary Gastroenterologist:  Dr. Gala Romney  Chief Complaint  Patient presents with  . Gastroesophageal Reflux    burping, pressure in chest, EKG normal, abnormal spot on liver, loose stools with odor    HPI:   Maria Holt is a 59 y.o. female presenting today at the request of Dr. Gerarda Fraction due to abdominal pain. Last colonoscopy in 2014 with diverticulosis and internal hemorrhoids. 10-year-screening recommended.   Noted 2 month history of intermittent nausea. Onset of burping, pressure in chest at least for 2 months. Some nights will be burping for a half hour. Incessant burping at times. Random and not postprandial. Decreased appetite. Feels pressure in chest and doesn't want to eat. Knows she will start burping. One occasion felt like a band in upper abdomen and was very tender. Felt like a vice around upper abdomen. No tenderness any longer in upper abdomen. No postprandial abdominal pain but will have worsening reflux. History of Ibuprofen use for years. 400 mg daily. Prevacid 15 mg daily chronically. No significant weight loss. Fluctuating.   Last few days has had lower abdominal discomfort, almost like a menstrual cramp. Looser stools associated. Initially stools more unformed, softer. Onset of loose stools within the last few weeks to a month, maybe longer. Baseline bowel habits would be having 1 BM in the morning. Has had 2 this morning already but runny, not watery. Pile of mush. Just doesn't feel good. Eats and food feels heavy. Not happening every day. No recent antibiotics. No sick contacts with GI issues. Vague burning with urinating.   Outside labs reviewed from April 2021: Tbili 0.4, Alk Phos 118, AST 29, ALT 36. CRP elevated at 60. Hep B surface antigen negative, Hep C negative. WBC count 10.4, Hgb 13.9, Hct 40.4 platelets 272.   US abdomen complete August 12, 2019 outside facility: heterogeneous  hypoechoic lesion of right lobe of liver measuring 2.0 x 1.8 x 1.6 cm. Fatty liver. Spleen size normal. She has an upcoming MRI already scheduled on 6/1.   Past Medical History:  Diagnosis Date  . Anemia   . Chronic lower back pain   . De Quervain's disease (tenosynovitis)    "left hand" (03/19/2012)  . GERD (gastroesophageal reflux disease)   . Hypertension   . 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 OSA on CPAP 2005   CPAP-has not used in years  . Post-traumatic osteoarthritis of right knee 1989   "tore ACL; failed reconstruction" (03/19/2012)    Past Surgical History:  Procedure Laterality Date  . ANTERIOR CERVICAL DECOMP/DISCECTOMY FUSION  01/2001  . [place]   "right" (03/19/2012)  . BACK SURGERY     C 5-6 fusion  . COLONOSCOPY N/A 02/03/2013   normal mucosa in TI, mild sigmoid diverticulosis, moderate sized internal hemorrhoids.    Marland Kitchen EXCISIONAL TOTAL KNEE ARTHROPLASTY WITH ANTIBIOTIC SPACERS Right 06/23/2013   Procedure: RIGHT RESECTION KNEE ARTHROPLASTY ;  Surgeon: Mauri Pole, MD;  Location: [facility];  Service: Orthopedics;  Laterality: Right;  . I & D KNEE WITH POLY EXCHANGE  04/19/2012   Procedure: IRRIGATION AND DEBRIDEMENT KNEE WITH POLY EXCHANGE;  Surgeon: Johnny Bridge, MD;  Location: [place];  Service: Orthopedics;  Laterality: Right;  irrigation and debridement right knee arthroplasty with poly exhange  . KNEE ARTHROSCOPY  07/2001   RIGHT  . REIMPLANTATION OF TOTAL KNEE Right 08/18/2013  Procedure: RIGHT TOTAL KNEE REIMPLANTATION AND REMOVAL OF ANTIBIOTIC SPACER;  Surgeon: Mauri Pole, MD;  Location: [facility];  Service: Orthopedics;  Laterality: Right;  . TOTAL KNEE ARTHROPLASTY  03/19/2012   "right" (03/19/2012)  . TOTAL KNEE ARTHROPLASTY  03/19/2012   Procedure: TOTAL KNEE ARTHROPLASTY;  Surgeon: Johnny Bridge, MD;  Location: [place];  Service: Orthopedics;  Laterality: Right;    Current Outpatient Medications   Medication Sig Dispense Refill  . acetaminophen (TYLENOL) 500 MG tablet Take 2 tablets (1,000 mg total) by mouth every 8 (eight) hours. (Patient taking differently: Take 1,000 mg by mouth as needed. ) 30 tablet 0  . Ascorbic Acid (VITAMIN C) 1000 MG tablet Take 1,000 mg by mouth daily.    Marland Kitchen BIOTIN PO Take by mouth daily.    . calcium carbonate (OS-CAL) 600 MG TABS Take 1,200 mg by mouth daily.    . Cholecalciferol (D3 PO) Take by mouth daily.    Marland Kitchen ibuprofen (ADVIL) 200 MG tablet Take 200 mg by mouth every 6 (six) hours as needed. Takes 400 mg daily    . lansoprazole (PREVACID) 15 MG capsule Take 15 mg by mouth daily.    Marland Kitchen lisinopril-hydrochlorothiazide (PRINZIDE,ZESTORETIC) 10-12.5 MG per tablet Take 1 tablet by mouth every morning.     . Multiple Vitamins-Minerals (MULTIVITAMIN WITH MINERALS) tablet Take 1 tablet by mouth daily.    . Multiple Vitamins-Minerals (ZINC PO) Take by mouth daily.    . Omega-3 Fatty Acids (FISH OIL) 1000 MG CAPS Take by mouth daily.    Marland Kitchen tiZANidine (ZANAFLEX) 4 MG tablet Take 4 mg by mouth as needed.    . triamcinolone cream (KENALOG) 0.1 % Apply 1 application topically as needed (Dry Skin). For eczema    . pantoprazole (PROTONIX) 40 MG tablet Take 1 tablet (40 mg total) by mouth daily. Before breakfast 30 tablet 3   No current facility-administered medications for this visit.    Allergies as of 08/28/2019 - Review Complete 08/28/2019  Allergen Reaction Noted  . Dilaudid [hydromorphone hcl] Hives and Other (See Comments) 03/06/2012  . Vancomycin Other (See Comments) 06/25/2013  . Demerol [meperidine] Hives 03/06/2012  . Morphine and related Hives 03/06/2012  . Hibiclens [chlorhexidine gluconate]  04/19/2012  . Sulfa antibiotics Other (See Comments) 03/06/2012    Family History  Problem Relation Age of Onset  . Coronary artery disease Father   . Colon cancer Neg Hx   . Colon polyps Neg Hx     Social History   Socioeconomic History  . Marital status:  Married    Spouse name: Not on file  . Number of children: Not on file  . Years of education: Not on file  . Highest education level: Not on file  Occupational History  . Not on file  Tobacco Use  . Smoking status: Never Smoker  . Smokeless tobacco: Never Used  Substance and Sexual Activity  . Alcohol use: No  . Drug use: No  . Sexual activity: Yes  Other Topics Concern  . Not on file  Social History Narrative  . Not on file   Social Determinants of Health   Financial Resource Strain:   . Difficulty of Paying Living Expenses:   Food Insecurity:   . Worried About Charity fundraiser in the Last Year:   . Arboriculturist in the Last Year:   Transportation Needs:   . Film/video editor (Medical):   Marland Kitchen Lack of Transportation (Non-Medical):   Physical  Activity:   . Days of Exercise per Week:   . Minutes of Exercise per Session:   Stress:   . Feeling of Stress :   Social Connections:   . Frequency of Communication with Friends and Family:   . Frequency of Social Gatherings with Friends and Family:   . Attends Religious Services:   . Active Member of Clubs or Organizations:   . Attends Archivist Meetings:   Marland Kitchen Marital Status:   Intimate Partner Violence:   . Fear of Current or Ex-Partner:   . Emotionally Abused:   Marland Kitchen Physically Abused:   . Sexually Abused:     Review of Systems: Gen: see HPI CV: Denies chest pain, heart palpitations, peripheral edema, syncope.  Resp: Denies shortness of breath at rest or with exertion. Denies wheezing or cough.  GI: see HPI GU : Denies urinary burning, urinary frequency, urinary hesitancy MS: Denies joint pain, muscle weakness, cramps, or limitation of movement.  Derm: Denies rash, itching, dry skin Psych: Denies depression, anxiety, memory loss, and confusion Heme: see HPI  Physical Exam: BP 140/86   Pulse 84   Temp (!) 96.8 F (36 C) (Temporal)   Ht 5' 5"  (1.651 m)   Wt 268 lb 6.4 oz (121.7 kg)   LMP 10/18/2016    BMI 44.66 kg/m  General:   Alert and oriented. Pleasant and cooperative. Well-nourished and well-developed.  Head:  Normocephalic and atraumatic. Eyes:  Without icterus, sclera clear and conjunctiva pink.  Ears:  Normal auditory acuity. Mouth:  Mask present Lungs:  Clear to auscultation bilaterally. No wheezes, rales, or rhonchi. No distress.  Heart:  S1, S2 present without murmurs appreciated.  Abdomen:  +BS, soft, mild TTP lower abdomen, +epigastric discomfort and non-distended. No HSM noted. No guarding or rebound. No masses appreciated.  Rectal:  Deferred  Msk:  Symmetrical without gross deformities. Normal posture. Extremities:  Without edema. Neurologic:  Alert and  oriented x4;  grossly normal neurologically. Psych:  Alert and cooperative. Normal mood and affect.  ASSESSMENT: Maria Holt is a 59 y.o. female presenting today with two month history of dyspepsia, several day history of lower abdominal cramping and looser stool, outside [place] with liver lesion incompletely characterized and MRI upcoming on 6/1.   Dyspepsia: in setting of Ibuprofen. Stop Prevacid 15 mg daily and start Protonix daily. May need EGD.   Lower abdominal cramping with looser stool: stool studies ordered. UA with culture reflex ordered due to urinary symptoms.   Liver lesion: MRI on 6/1. Doubt this is culprit for abdominal pain. Will follow-up results when available.    PLAN:  Stop Prevacid. Protonix once daily sent to pharmacy  Avoid NSAIDs if at all possible  Stool studies ordered  UA with culture reflex  MRI 6/1  Call with progress report in 1-2 weeks  3 month follow-up   Annitta Needs, PhD, ANP-BC [facility]

## 2019-09-01 ENCOUNTER — Encounter: Payer: Self-pay | Admitting: Gastroenterology

## 2019-09-19 NOTE — Progress Notes (Deleted)
CARDIOLOGY CONSULT NOTE       Patient ID: Maria Holt MRN: [id number] DOB/AGE: 18-Aug-1960 59 y.o.  Admit date: (Not on file) Referring Physician: Hilma Favors Primary Physician: Sharilyn Sites, MD Primary Cardiologist: New Reason for Consultation: Chest Pain  Active Problems:   * No active hospital problems. *   HPI:  59 y.o. with history of obesity , abnormal LFTls chronic back pain HTN, HLD OSA  Referred by Dr Hilma Favors for atypical chest pain She has had recent low grade fever and abdominal bloating with US showing cystic liver lesions needing f/u MRI. Mildly abnormal ALT/Alk Phos   ***   Lab review from 08/08/19 Hct 40.4 Cr 0.81 K 4.1 Alk P 118 ALT 36 C reactive Protein 60 LDL 143 ESR 36 TSH 2.7   ROS All other systems reviewed and negative except as noted above  Past Medical History:  Diagnosis Date  . Anemia   . Chronic lower back pain   . De Quervain's disease (tenosynovitis)    "left hand" (03/19/2012)  . GERD (gastroesophageal reflux disease)   . Hypertension   . Infection of total right knee replacement ([place]) 04/19/2012  . OSA on CPAP 2005   CPAP-has not used in years  . Post-traumatic osteoarthritis of right knee 1989   "tore ACL; failed reconstruction" (03/19/2012)    Family History  Problem Relation Age of Onset  . Coronary artery disease Father   . Colon cancer Neg Hx   . Colon polyps Neg Hx     Social History   Socioeconomic History  . Marital status: Married    Spouse name: Not on file  . Number of children: Not on file  . Years of education: Not on file  . Highest education level: Not on file  Occupational History  . Not on file  Tobacco Use  . Smoking status: Never Smoker  . Smokeless tobacco: Never Used  Substance and Sexual Activity  . Alcohol use: No  . Drug use: No  . Sexual activity: Yes  Other Topics Concern  . Not on file  Social History Narrative  . Not on file   Social Determinants of Health   Financial Resource Strain:   .  Difficulty of Paying Living Expenses:   Food Insecurity:   . Worried About Charity fundraiser in the Last Year:   . Arboriculturist in the Last Year:   Transportation Needs:   . Film/video editor (Medical):   Marland Kitchen Lack of Transportation (Non-Medical):   Physical Activity:   . Days of Exercise per Week:   . Minutes of Exercise per Session:   Stress:   . Feeling of Stress :   Social Connections:   . Frequency of Communication with Friends and Family:   . Frequency of Social Gatherings with Friends and Family:   . Attends Religious Services:   . Active Member of Clubs or Organizations:   . Attends Archivist Meetings:   Marland Kitchen Marital Status:   Intimate Partner Violence:   . Fear of Current or Ex-Partner:   . Emotionally Abused:   Marland Kitchen Physically Abused:   . Sexually Abused:     Past Surgical History:  Procedure Laterality Date  . ANTERIOR CERVICAL DECOMP/DISCECTOMY FUSION  01/2001  . [place]   "right" (03/19/2012)  . BACK SURGERY     C 5-6 fusion  . COLONOSCOPY N/A 02/03/2013   normal mucosa in TI, mild sigmoid diverticulosis, moderate sized  internal hemorrhoids.    Marland Kitchen EXCISIONAL TOTAL KNEE ARTHROPLASTY WITH ANTIBIOTIC SPACERS Right 06/23/2013   Procedure: RIGHT RESECTION KNEE ARTHROPLASTY ;  Surgeon: Mauri Pole, MD;  Location: [facility];  Service: Orthopedics;  Laterality: Right;  . I & D KNEE WITH POLY EXCHANGE  04/19/2012   Procedure: IRRIGATION AND DEBRIDEMENT KNEE WITH POLY EXCHANGE;  Surgeon: Johnny Bridge, MD;  Location: [place];  Service: Orthopedics;  Laterality: Right;  irrigation and debridement right knee arthroplasty with poly exhange  . KNEE ARTHROSCOPY  07/2001   RIGHT  . REIMPLANTATION OF TOTAL KNEE Right 08/18/2013   Procedure: RIGHT TOTAL KNEE REIMPLANTATION AND REMOVAL OF ANTIBIOTIC SPACER;  Surgeon: Mauri Pole, MD;  Location: [facility];  Service: Orthopedics;  Laterality: Right;  . TOTAL KNEE ARTHROPLASTY  03/19/2012   "right"  (03/19/2012)  . TOTAL KNEE ARTHROPLASTY  03/19/2012   Procedure: TOTAL KNEE ARTHROPLASTY;  Surgeon: Johnny Bridge, MD;  Location: [place];  Service: Orthopedics;  Laterality: Right;      Current Outpatient Medications:  .  acetaminophen (TYLENOL) 500 MG tablet, Take 2 tablets (1,000 mg total) by mouth every 8 (eight) hours. (Patient taking differently: Take 1,000 mg by mouth as needed. ), Disp: 30 tablet, Rfl: 0 .  Ascorbic Acid (VITAMIN C) 1000 MG tablet, Take 1,000 mg by mouth daily., Disp: , Rfl:  .  BIOTIN PO, Take by mouth daily., Disp: , Rfl:  .  calcium carbonate (OS-CAL) 600 MG TABS, Take 1,200 mg by mouth daily., Disp: , Rfl:  .  Cholecalciferol (D3 PO), Take by mouth daily., Disp: , Rfl:  .  ibuprofen (ADVIL) 200 MG tablet, Take 200 mg by mouth every 6 (six) hours as needed. Takes 400 mg daily, Disp: , Rfl:  .  lansoprazole (PREVACID) 15 MG capsule, Take 15 mg by mouth daily., Disp: , Rfl:  .  lisinopril-hydrochlorothiazide (PRINZIDE,ZESTORETIC) 10-12.5 MG per tablet, Take 1 tablet by mouth every morning. , Disp: , Rfl:  .  Multiple Vitamins-Minerals (MULTIVITAMIN WITH MINERALS) tablet, Take 1 tablet by mouth daily., Disp: , Rfl:  .  Multiple Vitamins-Minerals (ZINC PO), Take by mouth daily., Disp: , Rfl:  .  Omega-3 Fatty Acids (FISH OIL) 1000 MG CAPS, Take by mouth daily., Disp: , Rfl:  .  pantoprazole (PROTONIX) 40 MG tablet, Take 1 tablet (40 mg total) by mouth daily. Before breakfast, Disp: 30 tablet, Rfl: 3 .  tiZANidine (ZANAFLEX) 4 MG tablet, Take 4 mg by mouth as needed., Disp: , Rfl:  .  triamcinolone cream (KENALOG) 0.1 %, Apply 1 application topically as needed (Dry Skin). For eczema, Disp: , Rfl:     Physical Exam: Last menstrual period 10/18/2016.    Affect appropriate Obese white female  HEENT: normal Neck supple with no adenopathy previous C spine surgery  JVP normal no bruits no thyromegaly Lungs clear with no wheezing and good diaphragmatic motion Heart:   S1/S2 no murmur, no rub, gallop or click PMI normal Abdomen: benighn, BS positve, no tenderness, no AAA no bruit.  No HSM or HJR Distal pulses intact with no bruits No edema Neuro non-focal Skin warm and dry Previous right TKR    Labs:   Lab Results  Component Value Date   WBC 11.4 (H) 08/19/2013   HGB 10.7 (L) 08/19/2013   HCT 32.0 (L) 08/19/2013   MCV 84.2 08/19/2013   PLT 242 08/19/2013   No results for input(s): NA, K, CL, CO2, BUN, CREATININE, CALCIUM, PROT, BILITOT, ALKPHOS, ALT,  AST, GLUCOSE in the last 168 hours.  Invalid input(s): LABALBU Lab Results  Component Value Date   CKTOTAL 30 06/24/2013     Radiology: No results found.  EKG: 2015 SR insignificant lateral Q waves nonspecific ST changes    ASSESSMENT AND PLAN:   1. Chest Pain:  2. GI: chronic abdominal bloating needs f/u MRI for cystic lesions in liver seen on [place] 08/12/19  with mildly abnormal LFTls  3. HTN:  Continue ACE/diuretic discussed low sodium diet and weight loss   Signed: Jenkins Rouge 09/19/2019, 4:30 PM

## 2019-09-26 ENCOUNTER — Ambulatory Visit: Payer: BC Managed Care – PPO | Admitting: Cardiovascular Disease

## 2019-11-19 ENCOUNTER — Telehealth: Payer: Self-pay | Admitting: Gastroenterology

## 2019-11-19 NOTE — Telephone Encounter (Signed)
Can we check with patient to see about status of MRI? I don't have this in our system and no way of seeing it elsewhere.

## 2019-11-20 NOTE — Telephone Encounter (Signed)
Lmom, waiting on a return call.  

## 2019-11-24 ENCOUNTER — Telehealth: Payer: Self-pay

## 2019-11-24 NOTE — Telephone Encounter (Signed)
Opened in error

## 2019-11-25 ENCOUNTER — Other Ambulatory Visit: Payer: Self-pay

## 2019-11-25 NOTE — Telephone Encounter (Signed)
Lmom, waiting on a return call.  

## 2019-11-26 ENCOUNTER — Other Ambulatory Visit: Payer: Self-pay

## 2019-12-02 MED ORDER — PANTOPRAZOLE SODIUM 40 MG PO TBEC: 40.0000 mg | DELAYED_RELEASE_TABLET | Freq: Every day | ORAL | 5 refills | Status: DC

## 2019-12-09 ENCOUNTER — Ambulatory Visit: Payer: BC Managed Care – PPO | Admitting: Gastroenterology

## 2019-12-25 ENCOUNTER — Ambulatory Visit (INDEPENDENT_AMBULATORY_CARE_PROVIDER_SITE_OTHER): Payer: BC Managed Care – PPO | Admitting: Obstetrics & Gynecology

## 2019-12-25 ENCOUNTER — Encounter: Payer: Self-pay | Admitting: Obstetrics & Gynecology

## 2019-12-25 VITALS — BP 127/79 | HR 90 | Ht 65.0 in | Wt 248.0 lb

## 2019-12-25 DIAGNOSIS — D251 Intramural leiomyoma of uterus: Secondary | ICD-10-CM

## 2019-12-25 NOTE — Progress Notes (Signed)
Follow up appointment for results  Chief Complaint  Patient presents with  . mass in uterus    Blood pressure 127/79, pulse 90, height 5\' 5"  (1.651 m), weight 248 lb (112.5 kg), last menstrual period 10/18/2016.  CLINICAL DATA: Perianal neuralgia.   EXAM:  TRANSABDOMINAL AND TRANSVAGINAL ULTRASOUND OF PELVIS   TECHNIQUE:  Both transabdominal and transvaginal ultrasound examinations of the  pelvis were performed. Transabdominal technique was performed for  global imaging of the pelvis including uterus, ovaries, adnexal  regions, and pelvic cul-de-sac. It was necessary to proceed with  endovaginal exam following the transabdominal exam to visualize the  uterus, endometrium and adnexa.   COMPARISON: None   FINDINGS:  Uterus   Measurements: 8.1 x 3.6 x 5.1 cm = volume: 78 mL. There is a mass  measuring 2.2 x 2.1 x 2.2 cm along the anterior uterus which most  likely represents a fibroid.   Endometrium   Thickness: 0.4 cm. No focal abnormality visualized.   Right ovary   Measurements: 2.5 x 1.7 x 2.0 cm = volume: 4.5 mL. Normal  appearance/no adnexal mass. The right ovary was only seen  transabdominally.   Left ovary   Measurements: 2.0 x 0.7 x 2.5 cm = volume: 2.2 mL. Normal  appearance/no adnexal mass.   Other findings   Trace free fluid.  Procedure Note  Interface, Rad Results In - 11/28/2019 10:48 AM EDT  Formatting of this note might be different from the original.  CLINICAL DATA: Perianal neuralgia.   EXAM:  TRANSABDOMINAL AND TRANSVAGINAL ULTRASOUND OF PELVIS   TECHNIQUE:  Both transabdominal and transvaginal ultrasound examinations of the  pelvis were performed. Transabdominal technique was performed for  global imaging of the pelvis including uterus, ovaries, adnexal  regions, and pelvic cul-de-sac. It was necessary to proceed with  endovaginalexam following the transabdominal exam to visualize the  uterus, endometrium and adnexa.    COMPARISON: None   FINDINGS:  Uterus   Measurements: 8.1 x 3.6 x 5.1 cm = volume: 78 mL. There is a mass  measuring 2.2 x 2.1 x 2.2 cm along the anterior uterus which most  likely represents a fibroid.   Endometrium   Thickness: 0.4 cm. No focal abnormality visualized.   Right ovary   Measurements: 2.5 x 1.7 x 2.0 cm = volume: 4.5 mL. Normal  appearance/no adnexal mass. The right ovary was only seen  transabdominally.   Left ovary   Measurements: 2.0 x 0.7 x 2.5 cm = volume: 2.2 mL. Normal  appearance/no adnexal mass.   Other findings   Trace free fluid.   IMPRESSION:  1. There is a 2.2 cm mass in the anterior uterus most likely  representing a fibroid.   2. No other abnormalities in the uterus or bilateral ovaries.    Electronically Signed   By: Audie Pinto M.D.      MEDS ordered this encounter: No orders of the defined types were placed in this encounter.   Orders for this encounter: No orders of the defined types were placed in this encounter.   Impression:   ICD-10-CM   1. Intramural leiomyoma of uterus 2 cm  D25.1      Plan: This represents a normal sonographic finding Endometrium ovaries are normal as well No follow-up scan is needed on an interval basis  Follow Up: Return if symptoms worsen or fail to improve.       Face to face time:  10 minutes  Greater than 50% of the visit time  was spent in counseling and coordination of care with the patient.  The summary and outline of the counseling and care coordination is summarized in the note above.   All questions were answered.  Past Medical History:  Diagnosis Date  . Anemia   . Chronic lower back pain   . COVID-19   . De Quervain's disease (tenosynovitis)    "left hand" (03/19/2012)  . GERD (gastroesophageal reflux disease)   . Hypertension   . 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 OSA on CPAP 2005   CPAP-has not used in years  .  Post-traumatic osteoarthritis of right knee 1989   "tore ACL; failed reconstruction" (03/19/2012)    Past Surgical History:  Procedure Laterality Date  . ANTERIOR CERVICAL DECOMP/DISCECTOMY FUSION  01/2001  . [place]   "right" (03/19/2012)  . BACK SURGERY     C 5-6 fusion  . COLONOSCOPY N/A 02/03/2013   normal mucosa in TI, mild sigmoid diverticulosis, moderate sized internal hemorrhoids.    Marland Kitchen EXCISIONAL TOTAL KNEE ARTHROPLASTY WITH ANTIBIOTIC SPACERS Right 06/23/2013   Procedure: RIGHT RESECTION KNEE ARTHROPLASTY ;  Surgeon: Mauri Pole, MD;  Location: [facility];  Service: Orthopedics;  Laterality: Right;  . I & D KNEE WITH POLY EXCHANGE  04/19/2012   Procedure: IRRIGATION AND DEBRIDEMENT KNEE WITH POLY EXCHANGE;  Surgeon: Johnny Bridge, MD;  Location: [place];  Service: Orthopedics;  Laterality: Right;  irrigation and debridement right knee arthroplasty with poly exhange  . KNEE ARTHROSCOPY  07/2001   RIGHT  . REIMPLANTATION OF TOTAL KNEE Right 08/18/2013   Procedure: RIGHT TOTAL KNEE REIMPLANTATION AND REMOVAL OF ANTIBIOTIC SPACER;  Surgeon: Mauri Pole, MD;  Location: [facility];  Service: Orthopedics;  Laterality: Right;  . TOTAL KNEE ARTHROPLASTY  03/19/2012   "right" (03/19/2012)  . TOTAL KNEE ARTHROPLASTY  03/19/2012   Procedure: TOTAL KNEE ARTHROPLASTY;  Surgeon: Johnny Bridge, MD;  Location: [place];  Service: Orthopedics;  Laterality: Right;    OB History    Gravida  1   Para  1   Term  1   Preterm      AB      Living  1     SAB      IAB      Ectopic      Multiple      Live Births  1           Allergies  Allergen Reactions  . Dilaudid [Hydromorphone Hcl] Hives and Other (See Comments)    "I don't remember the reaction; just know it was a no no" (03/19/2012)  . Vancomycin Other (See Comments)    Severe red-man-like rxn.  Pt reportedly declines further vancomycin therapy.  . Demerol [Meperidine] Hives  . Morphine And Related  Hives  . Hibiclens [Chlorhexidine Gluconate]     CHG wipes make her itch, does ok with hcg showers with liquid  . Sulfa Antibiotics Other (See Comments)    "don't remember the reaction; Dr. just told me I was allergic" (03/19/2012)    Social History   Socioeconomic History  . Marital status: Married    Spouse name: Not on file  . Number of children: Not on file  . Years of education: Not on file  . Highest education level: Not on file  Occupational History  . Not on file  Tobacco Use  . Smoking status: Never Smoker  . Smokeless tobacco: Never Used  Vaping Use  . Vaping Use: Never used  Substance and Sexual Activity  . Alcohol use: No  . Drug use: No  . Sexual activity: Yes    Birth control/protection: Post-menopausal  Other Topics Concern  . Not on file  Social History Narrative  . Not on file   Social Determinants of Health   Financial Resource Strain: Low Risk   . Difficulty of Paying Living Expenses: Not hard at all  Food Insecurity: No Food Insecurity  . Worried About Charity fundraiser in the Last Year: Never true  . Ran Out of Food in the Last Year: Never true  Transportation Needs: No Transportation Needs  . Lack of Transportation (Medical): No  . Lack of Transportation (Non-Medical): No  Physical Activity: Insufficiently Active  . Days of Exercise per Week: 4 days  . Minutes of Exercise per Session: 20 min  Stress: No Stress Concern Present  . Feeling of Stress : Not at all  Social Connections: Socially Integrated  . Frequency of Communication with Friends and Family: Twice a week  . Frequency of Social Gatherings with Friends and Family: Three times a week  . Attends Religious Services: More than 4 times per year  . Active Member of Clubs or Organizations: Yes  . Attends Archivist Meetings: More than 4 times per year  . Marital Status: Married    Family History  Problem Relation Age of Onset  . Coronary artery disease Father   . Diabetes  Brother   . Colon cancer Neg Hx   . Colon polyps Neg Hx

## 2020-02-17 NOTE — Telephone Encounter (Signed)
    I was able to get into [facility] system. MRI from June 2021:  1. There is no suspicious lesion or contrast enhancement appreciated  in the right lobe of the liver in the expected vicinity of a subtle  hypodense lesion seen on prior ultrasound. This may reflect focal  fatty sparing or other nonsuspicious finding not well appreciated by  MR.   2.  Hepatic steatosis.

## 2020-04-18 ENCOUNTER — Encounter: Payer: Self-pay | Admitting: Obstetrics & Gynecology

## 2020-09-01 ENCOUNTER — Emergency Department (HOSPITAL_COMMUNITY): Payer: BC Managed Care – PPO

## 2020-09-01 ENCOUNTER — Other Ambulatory Visit: Payer: Self-pay

## 2020-09-01 ENCOUNTER — Encounter (HOSPITAL_COMMUNITY): Payer: Self-pay

## 2020-09-01 ENCOUNTER — Emergency Department (HOSPITAL_COMMUNITY)
Admission: EM | Admit: 2020-09-01 | Discharge: 2020-09-01 | Disposition: A | Discharge status: Home / Self Care | Payer: BC Managed Care – PPO | Attending: Emergency Medicine | Admitting: Emergency Medicine

## 2020-09-01 DIAGNOSIS — Z96651 Presence of right artificial knee joint: Secondary | ICD-10-CM | POA: Insufficient documentation

## 2020-09-01 DIAGNOSIS — Z8616 Personal history of COVID-19: Secondary | ICD-10-CM | POA: Insufficient documentation

## 2020-09-01 DIAGNOSIS — M25551 Pain in right hip: Secondary | ICD-10-CM | POA: Diagnosis not present

## 2020-09-01 DIAGNOSIS — Z79899 Other long term (current) drug therapy: Secondary | ICD-10-CM | POA: Insufficient documentation

## 2020-09-01 DIAGNOSIS — I10 Essential (primary) hypertension: Secondary | ICD-10-CM | POA: Insufficient documentation

## 2020-09-01 LAB — URINALYSIS, ROUTINE W REFLEX MICROSCOPIC
Bacteria, UA: NONE SEEN
Bilirubin Urine: NEGATIVE
Glucose, UA: NEGATIVE mg/dL
Ketones, ur: 20 mg/dL — AB
Leukocytes,Ua: NEGATIVE
Nitrite: NEGATIVE
Protein, ur: NEGATIVE mg/dL
Specific Gravity, Urine: 1.008 (ref 1.005–1.030)
pH: 7 (ref 5.0–8.0)

## 2020-09-01 MED ORDER — METHOCARBAMOL 500 MG PO TABS: 500.0000 mg | ORAL_TABLET | Freq: Three times a day (TID) | ORAL | 0 refills | Status: DC

## 2020-09-01 MED ORDER — KETOROLAC TROMETHAMINE 60 MG/2ML IM SOLN
60.0000 mg | Freq: Once | INTRAMUSCULAR | Status: AC
  Administered 2020-09-01: 60 mg via INTRAMUSCULAR
  Filled 2020-09-01: qty 2

## 2020-09-01 MED ORDER — DIAZEPAM 5 MG PO TABS
5.0000 mg | ORAL_TABLET | Freq: Once | ORAL | Status: AC
  Administered 2020-09-01: 5 mg via ORAL
  Filled 2020-09-01: qty 1

## 2020-09-01 MED ORDER — TRAMADOL HCL 50 MG PO TABS: 50.0000 mg | ORAL_TABLET | Freq: Four times a day (QID) | ORAL | 0 refills | Status: DC | PRN

## 2020-09-01 MED ORDER — HYDROCODONE-ACETAMINOPHEN 5-325 MG PO TABS
1.0000 | ORAL_TABLET | Freq: Once | ORAL | Status: AC
  Administered 2020-09-01: 1 via ORAL
  Filled 2020-09-01: qty 1

## 2020-09-01 NOTE — Discharge Instructions (Signed)
I recommend alternating ice and heat to your hip.  Take the medication as directed.  The prescribed medications may cause drowsiness.  As discussed, your urine test today also shows that you have blood in your urine.  You will likely need further evaluation by urology for this.  You may call the urologist listed to arrange a follow-up appointment.  Return to the emergency department for any new or worsening symptoms.

## 2020-09-01 NOTE — ED Notes (Signed)
Patient transported to X-ray 

## 2020-09-01 NOTE — ED Triage Notes (Signed)
Pt to er, pt states that she had a  Sudden onset of pain last night that woke her from sleep at 4am, states that she called her chiropractor and he got her in today and did an adjustment, states that he adjusted her, states that she took some motrin and tylenol but her md thought that she might need more, states that her pmd couldn't get her in until Monday, urgent care was a long wait so she came to the er .

## 2020-09-05 NOTE — ED Provider Notes (Signed)
[facility] EMERGENCY DEPARTMENT Provider Note   CSN: [id number] Arrival date & time: 09/01/20  1152     History Chief Complaint  Patient presents with  . Hip Pain    Maria Holt is a 60 y.o. female.  HPI      Maria Holt is a 61 y.o. female with past medical history of anemia, hypertension, and chronic low back pain who presents to the Emergency Department complaining of increasing pain in her right hip.  She has been seeing a chiropractor for some time and getting intermittent adjustments.  States that she woke from her sleep the night before ER arrival with significant pain of her right hip radiating into her right thigh.  Her chiropractor performed an adjustment on her without relief.  She has tried taking over-the-counter medications without improvement.  Pain is worse with weightbearing, improves slightly with rest.  She denies abdominal pain, flank pain or dysuria.  No bowel changes.  No numbness or weakness of the lower extremities.  No fever or chills. She has not had a recent injury.  Past Medical History:  Diagnosis Date  . Anemia   . Chronic lower back pain   . COVID-19   . De Quervain's disease (tenosynovitis)    "left hand" (03/19/2012)  . GERD (gastroesophageal reflux disease)   . Hypertension   . 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 OSA on CPAP 2005   CPAP-has not used in years  . Post-traumatic osteoarthritis of right knee 1989   "tore ACL; failed reconstruction" (03/19/2012)    Patient Active Problem List   Diagnosis Date Noted  . Dyspepsia 08/28/2019  . Frequent stools 08/28/2019  . Urinary tract infection symptoms 08/28/2019  . Status post revision of total knee replacement 08/18/2013  . Acute blood loss anemia 06/25/2013  . Obese 06/25/2013  . S/P Right knee abx spacer / infection 06/23/2013  . Yeast infection 06/10/2012  . Right arm pain 05/13/2012  . Postoperative anemia due to acute blood loss 04/21/2012  . Infection  of total right knee replacement ([place]) 04/19/2012  . Difficulty in walking(719.7) 04/16/2012  . Stiffness of joint, not elsewhere classified, lower leg 04/16/2012  . Weakness of right leg 04/16/2012  . Post-traumatic osteoarthritis of left knee 03/19/2012  . BMI 40.0-44.9, adult ([place]) 03/19/2012    Past Surgical History:  Procedure Laterality Date  . ANTERIOR CERVICAL DECOMP/DISCECTOMY FUSION  01/2001  . [place]   "right" (03/19/2012)  . BACK SURGERY     C 5-6 fusion  . COLONOSCOPY N/A 02/03/2013   normal mucosa in TI, mild sigmoid diverticulosis, moderate sized internal hemorrhoids.    Marland Kitchen EXCISIONAL TOTAL KNEE ARTHROPLASTY WITH ANTIBIOTIC SPACERS Right 06/23/2013   Procedure: RIGHT RESECTION KNEE ARTHROPLASTY ;  Surgeon: Mauri Pole, MD;  [facility];  Service: Orthopedics;  Laterality: Right;  . I & D KNEE WITH POLY EXCHANGE  04/19/2012   Procedure: IRRIGATION AND DEBRIDEMENT KNEE WITH POLY EXCHANGE;  Surgeon: Johnny Bridge, MD;  Location: [place];  Service: Orthopedics;  Laterality: Right;  irrigation and debridement right knee arthroplasty with poly exhange  . KNEE ARTHROSCOPY  07/2001   RIGHT  . REIMPLANTATION OF TOTAL KNEE Right 08/18/2013   Procedure: RIGHT TOTAL KNEE REIMPLANTATION AND REMOVAL OF ANTIBIOTIC SPACER;  Surgeon: Mauri Pole, MD;  [facility];  Service: Orthopedics;  Laterality: Right;  . TOTAL KNEE ARTHROPLASTY  03/19/2012   "right" (03/19/2012)  .  TOTAL KNEE ARTHROPLASTY  03/19/2012   Procedure: TOTAL KNEE ARTHROPLASTY;  Surgeon: Johnny Bridge, MD;  Location: [place];  Service: Orthopedics;  Laterality: Right;     OB History    Gravida  1   Para  1   Term  1   Preterm      AB      Living  1     SAB      IAB      Ectopic      Multiple      Live Births  1           Family History  Problem Relation Age of Onset  . Coronary artery disease Father   . Diabetes Brother   . Colon cancer Neg Hx   . Colon  polyps Neg Hx     Social History   Tobacco Use  . Smoking status: Never Smoker  . Smokeless tobacco: Never Used  Vaping Use  . Vaping Use: Never used  Substance Use Topics  . Alcohol use: No  . Drug use: No    Home Medications Prior to Admission medications   Medication Sig Start Date End Date Taking? Authorizing Provider  methocarbamol (ROBAXIN) 500 MG tablet Take 1 tablet (500 mg total) by mouth 3 (three) times daily. 09/01/20  Yes Dorisann Schwanke, PA-C  traMADol (ULTRAM) 50 MG tablet Take 1 tablet (50 mg total) by mouth every 6 (six) hours as needed. 09/01/20  Yes Avion Patella, PA-C  acetaminophen (TYLENOL) 500 MG tablet Take 2 tablets (1,000 mg total) by mouth every 8 (eight) hours. Patient taking differently: Take 1,000 mg by mouth as needed.  08/19/13   Benedetto Goad, PA-C  Ascorbic Acid (VITAMIN C) 1000 MG tablet Take 1,000 mg by mouth daily.    [provider]  BIOTIN PO Take by mouth daily.    [provider]  calcium carbonate (OS-CAL) 600 MG TABS Take 1,200 mg by mouth daily.    [provider]  Cholecalciferol (D3 PO) Take by mouth daily.    [provider]  hydroxychloroquine (PLAQUENIL) 200 MG tablet Take by mouth once a week.    [provider]  ibuprofen (ADVIL) 200 MG tablet Take 200 mg by mouth every 6 (six) hours as needed. Takes 400 mg daily    [provider]  lisinopril-hydrochlorothiazide (PRINZIDE,ZESTORETIC) 10-12.5 MG per tablet Take 1 tablet by mouth every morning.     [provider]  Multiple Vitamins-Minerals (MULTIVITAMIN WITH MINERALS) tablet Take 1 tablet by mouth daily.    [provider]  Multiple Vitamins-Minerals (ZINC PO) Take by mouth daily.    [provider]  Omega-3 Fatty Acids (FISH OIL) 1000 MG CAPS Take by mouth daily.    [provider]  pantoprazole (PROTONIX) 40 MG tablet Take 1 tablet (40 mg total) by mouth daily. Before breakfast 12/02/19   Carlis Stable, NP  tiZANidine (ZANAFLEX) 4 MG tablet Take 4 mg by mouth as needed. 08/11/19   [provider]  triamcinolone cream (KENALOG) 0.1 % Apply 1 application topically as needed (Dry Skin). For eczema    [provider]    Allergies    Dilaudid [hydromorphone hcl], Vancomycin, Demerol [meperidine], Morphine and related, Hibiclens [chlorhexidine gluconate], and Sulfa antibiotics  Review of Systems   Review of Systems  Constitutional: Negative for appetite change, chills and fever.  Respiratory: Negative for shortness of breath.   Gastrointestinal: Negative for abdominal pain, constipation, nausea  and vomiting.  Genitourinary: Negative for decreased urine volume, difficulty urinating, dysuria, flank pain and hematuria.  Musculoskeletal: Positive for arthralgias (Right hip pain) and back pain. Negative for joint swelling.  Skin: Negative for rash.  Neurological: Negative for weakness and numbness.    Physical Exam Updated Vital Signs BP 135/63 (BP Location: Right Arm)   Pulse 65   Temp 98.2 F (36.8 C) (Oral)   Resp 16   Ht 5\' 5"  (1.651 m)   Wt 113.4 kg   LMP 10/18/2016   SpO2 99%   BMI 41.60 kg/m   Physical Exam Constitutional:      Appearance: Normal appearance. She is not ill-appearing or toxic-appearing.     Comments: Patient appears uncomfortable, nontoxic  Neck:     Thyroid: No thyromegaly.     Meningeal: Kernig's sign absent.  Cardiovascular:     Rate and Rhythm: Normal rate and regular rhythm.     Pulses: Normal pulses.  Pulmonary:     Effort: Pulmonary effort is normal.     Breath sounds: Normal breath sounds. No wheezing.  Abdominal:     Palpations: Abdomen is soft.     Tenderness: There is no abdominal tenderness. There is no right CVA tenderness, left CVA tenderness, guarding or rebound.  Musculoskeletal:        General: No swelling. Normal range of motion.     Cervical back: Normal range of motion. No tenderness.     Right lower leg: No  edema.     Left lower leg: No edema.     Comments: Tender to palpation of the anterior right hip joint.  Pain reproduced on range of motion.  She has some tenderness of the right SI joint as well.  Hip flexors and extensors are intact.    Skin:    General: Skin is warm.     Capillary Refill: Capillary refill takes less than 2 seconds.     Findings: No rash.  Neurological:     General: No focal deficit present.     Mental Status: She is alert.     Sensory: Sensation is intact. No sensory deficit.     Motor: Motor function is intact. No weakness.     Gait: Gait is intact.     ED Results / Procedures / Treatments   Labs (all labs ordered are listed, but only abnormal results are displayed) Labs Reviewed  URINALYSIS, ROUTINE W REFLEX MICROSCOPIC - Abnormal; Notable for the following components:      Result Value   Color, Urine STRAW (*)    Hgb urine dipstick MODERATE (*)    Ketones, ur 20 (*)    All other components within normal limits    EKG None  Radiology DG Lumbar Spine Complete  Result Date: 09/01/2020 CLINICAL DATA:  Acute lower back pain without known injury. EXAM: LUMBAR SPINE - COMPLETE 4+ VIEW COMPARISON:  January 01, 2018. FINDINGS: Minimal grade 1 anterolisthesis of L4-5 is noted secondary to posterior facet joint hypertrophy. No fracture is noted. Mild degenerative disc disease is noted at L3-4, L4-5 and L5-S1. IMPRESSION: Mild multilevel degenerative changes are noted. No acute abnormality is noted. Electronically Signed   By: Marijo Conception M.D.   On: 09/01/2020 15:57   CT Renal Hallas Study  Result Date: 09/01/2020 CLINICAL DATA:  Right back and groin pain with nausea which began at 4 a.m., microscopic hematuria, history of urolithiasis EXAM: CT ABDOMEN AND PELVIS WITHOUT CONTRAST TECHNIQUE: Multidetector CT imaging of the abdomen  and pelvis was performed following the standard protocol without IV contrast. COMPARISON:  11/01/2012 CT FINDINGS: Lower chest: Lung  bases are clear. Normal heart size. No pericardial effusion. Hepatobiliary: A no visible focal liver lesion. Smooth surface contour. Normal hepatic attenuation. Some mild gallbladder distention is likely within physiologic normal. No pericholecystic fluid or inflammation, calcified gallstones or biliary ductal dilatation. Pancreas: No pancreatic ductal dilatation or surrounding inflammatory changes. Spleen: Normal in size. No concerning splenic lesions. Adrenals/Urinary Tract: Normal adrenal glands. Kidneys are symmetric in size and normally located. No visible or contour deforming renal lesions. Punctate nonobstructing calculi are present in the lower poles of both kidneys. No obstructive urolithiasis or hydronephrosis is seen at this time. Urinary bladder is largely decompressed at the time of exam and therefore poorly evaluated by CT imaging. No visible bladder calculi or debris. Stomach/Bowel: Distal esophagus, stomach and duodenal sweep are unremarkable. No small bowel wall thickening or dilatation. No evidence of obstruction. A normal appendix is visualized. No colonic dilatation or wall thickening. Scattered colonic diverticula without focal inflammation to suggest diverticulitis. Vascular/Lymphatic: Atherosclerotic calcifications within the abdominal aorta and branch vessels. No aneurysm or ectasia. No enlarged abdominopelvic lymph nodes. Reproductive: Anteverted uterus.  No concerning adnexal lesions. Other: No abdominopelvic free fluid or free gas. No bowel containing hernias. Small rectus diastasis. Musculoskeletal: Mild degenerative changes in the spine. No acute osseous abnormality or suspicious osseous lesion. IMPRESSION: Bilateral nonobstructing nephrolithiasis. No obstructive urolithiasis or hydronephrosis is seen at this time. No other acute CT abnormality to provide a cause for patient's symptoms. Electronically Signed   By: Lovena Le M.D.   On: 09/01/2020 18:17   DG Hip Unilat W or Wo Pelvis  2-3 Views Right  Result Date: 09/01/2020 CLINICAL DATA:  Acute right hip pain without known injury. EXAM: DG HIP (WITH OR WITHOUT PELVIS) 2-3V RIGHT COMPARISON:  None. FINDINGS: There is no evidence of hip fracture or dislocation. Mild osteophyte formation is noted involving both hip joints. IMPRESSION: Mild osteoarthritis of both hips.  No acute abnormality seen. Electronically Signed   By: Marijo Conception M.D.   On: 09/01/2020 15:55     Procedures Procedures   Medications Ordered in ED Medications  ketorolac (TORADOL) injection 60 mg (60 mg Intramuscular Given 09/01/20 1514)  diazepam (VALIUM) tablet 5 mg (5 mg Oral Given 09/01/20 1513)  HYDROcodone-acetaminophen (NORCO/VICODIN) 5-325 MG per tablet 1 tablet (1 tablet Oral Given 09/01/20 1851)    ED Course  I have reviewed the triage vital signs and the nursing notes.  Pertinent labs & imaging results that were available during my care of the patient were reviewed by me and considered in my medical decision making (see chart for details).    MDM Rules/Calculators/A&P                          Patient here with right hip pain, no known injury.  Neurovascularly intact.  She has history of chronic low back pain and right hip pain felt to be radicular.  Will obtain urinalysis, plain film imaging of hip and L-spine.  X-rays of the L-spine and right hip show degenerative changes, no acute bony deformities.  Urinalysis shows hematuria of unknown source.  No dysuria symptoms.  Will order CT renal Legate study for further evaluation.  CT renal [place] study shows bilateral renal calculi without obstructive uropathy.  Kidneys appear symmetrical without visible lesions.  Source of patient's hematuria remains unclear.  Still feel that  her hip pain is likely radicular.  On recheck, patient reports feeling better after medications given here.  I feel that she is appropriate for discharge home.  Doubt emergent neurological process.  We will treat patient's  symptoms and she is agreeable to close outpatient follow-up with urology regarding her hematuria.  Final Clinical Impression(s) / ED Diagnoses Final diagnoses:  Right hip pain    Rx / DC Orders ED Discharge Orders         Ordered    methocarbamol (ROBAXIN) 500 MG tablet  3 times daily        09/01/20 1826    traMADol (ULTRAM) 50 MG tablet  Every 6 hours PRN        09/01/20 1826           Kem Parkinson, PA-C 09/05/20 1212    Truddie Hidden, MD 09/06/20 1020

## 2021-03-31 ENCOUNTER — Encounter: Payer: Self-pay | Admitting: Emergency Medicine

## 2021-03-31 ENCOUNTER — Ambulatory Visit
Admission: EM | Admit: 2021-03-31 | Discharge: 2021-03-31 | Disposition: A | Discharge status: Home / Self Care | Payer: BC Managed Care – PPO | Attending: Urgent Care | Admitting: Urgent Care

## 2021-03-31 ENCOUNTER — Other Ambulatory Visit: Payer: Self-pay

## 2021-03-31 DIAGNOSIS — R300 Dysuria: Secondary | ICD-10-CM | POA: Diagnosis present

## 2021-03-31 DIAGNOSIS — R350 Frequency of micturition: Secondary | ICD-10-CM | POA: Diagnosis present

## 2021-03-31 DIAGNOSIS — N3001 Acute cystitis with hematuria: Secondary | ICD-10-CM | POA: Diagnosis present

## 2021-03-31 LAB — POCT URINALYSIS DIP (MANUAL ENTRY)
Bilirubin, UA: NEGATIVE
Glucose, UA: NEGATIVE mg/dL
Nitrite, UA: NEGATIVE
Protein Ur, POC: NEGATIVE mg/dL
Spec Grav, UA: 1.02 (ref 1.010–1.025)
Urobilinogen, UA: 0.2 E.U./dL
pH, UA: 5.5 (ref 5.0–8.0)

## 2021-03-31 MED ORDER — NITROFURANTOIN MONOHYD MACRO 100 MG PO CAPS: 100.0000 mg | ORAL_CAPSULE | Freq: Two times a day (BID) | ORAL | 0 refills | Status: DC

## 2021-03-31 NOTE — ED Triage Notes (Signed)
Lower abd pain, feels bloated.  Urinary frequency.

## 2021-03-31 NOTE — Discharge Instructions (Addendum)

## 2021-03-31 NOTE — ED Provider Notes (Signed)
[place]   MRN: [id number] DOB: 1960/08/28  Subjective:   Maria Holt is a 60 y.o. female presenting for several day history of acute onset urinary frequency, dysuria, lower abdominal burning when she urinates. No fever, n/v, flank pain, vaginal discharge. Has a history of a fibroid, last seen in 2021. Has not caused issues per patient. Has a history of UTIs, UTI symptoms. Hydrates well, has about 2 liters of water daily. Only has 2 cups of coffee.   No current facility-administered medications for this encounter.  Current Outpatient Medications:    acetaminophen (TYLENOL) 500 MG tablet, Take 2 tablets (1,000 mg total) by mouth every 8 (eight) hours. (Patient taking differently: Take 1,000 mg by mouth as needed. ), Disp: 30 tablet, Rfl: 0   Ascorbic Acid (VITAMIN C) 1000 MG tablet, Take 1,000 mg by mouth daily., Disp: , Rfl:    BIOTIN PO, Take by mouth daily., Disp: , Rfl:    calcium carbonate (OS-CAL) 600 MG TABS, Take 1,200 mg by mouth daily., Disp: , Rfl:    Cholecalciferol (D3 PO), Take by mouth daily., Disp: , Rfl:    hydroxychloroquine (PLAQUENIL) 200 MG tablet, Take by mouth once a week., Disp: , Rfl:    ibuprofen (ADVIL) 200 MG tablet, Take 200 mg by mouth every 6 (six) hours as needed. Takes 400 mg daily, Disp: , Rfl:    lisinopril-hydrochlorothiazide (PRINZIDE,ZESTORETIC) 10-12.5 MG per tablet, Take 1 tablet by mouth every morning. , Disp: , Rfl:    methocarbamol (ROBAXIN) 500 MG tablet, Take 1 tablet (500 mg total) by mouth 3 (three) times daily., Disp: 21 tablet, Rfl: 0   Multiple Vitamins-Minerals (MULTIVITAMIN WITH MINERALS) tablet, Take 1 tablet by mouth daily., Disp: , Rfl:    Multiple Vitamins-Minerals (ZINC PO), Take by mouth daily., Disp: , Rfl:    Omega-3 Fatty Acids (FISH OIL) 1000 MG CAPS, Take by mouth daily., Disp: , Rfl:    pantoprazole (PROTONIX) 40 MG tablet, Take 1 tablet (40 mg total) by mouth daily. Before breakfast, Disp: 30 tablet, Rfl:  5   tiZANidine (ZANAFLEX) 4 MG tablet, Take 4 mg by mouth as needed., Disp: , Rfl:    traMADol (ULTRAM) 50 MG tablet, Take 1 tablet (50 mg total) by mouth every 6 (six) hours as needed., Disp: 12 tablet, Rfl: 0   triamcinolone cream (KENALOG) 0.1 %, Apply 1 application topically as needed (Dry Skin). For eczema, Disp: , Rfl:    Allergies  Allergen Reactions   Dilaudid [Hydromorphone Hcl] Hives and Other (See Comments)    "I don't remember the reaction; just know it was a no no" (03/19/2012)   Vancomycin Other (See Comments)    Severe red-man-like rxn.  Pt reportedly declines further vancomycin therapy.   Demerol [Meperidine] Hives   Morphine And Related Hives   Hibiclens [Chlorhexidine Gluconate]     CHG wipes make her itch, does ok with hcg showers with liquid   Sulfa Antibiotics Other (See Comments)    "don't remember the reaction; Dr. 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     EXCISIONAL TOTAL KNEE ARTHROPLASTY WITH ANTIBIOTIC SPACERS Right 06/23/2013   Procedure: RIGHT RESECTION KNEE ARTHROPLASTY ;  Surgeon: Mauri Pole, MD;  Location: [facility];  Service: Orthopedics;  Laterality: Right;   I & D KNEE WITH POLY EXCHANGE  04/19/2012    Procedure: IRRIGATION AND DEBRIDEMENT KNEE WITH POLY EXCHANGE;  Surgeon: Johnny Bridge, MD;  Location: [place];  Service: Orthopedics;  Laterality: Right;  irrigation and debridement right knee arthroplasty with poly exhange   KNEE ARTHROSCOPY  07/2001   RIGHT   REIMPLANTATION OF TOTAL KNEE Right 08/18/2013   Procedure: RIGHT TOTAL KNEE REIMPLANTATION AND REMOVAL OF ANTIBIOTIC SPACER;  Surgeon: Mauri Pole, MD;  Location: [facility];  Service: Orthopedics;  Laterality: Right;   TOTAL KNEE ARTHROPLASTY  03/19/2012   "right" (03/19/2012)   TOTAL KNEE ARTHROPLASTY  03/19/2012   Procedure: TOTAL KNEE ARTHROPLASTY;  Surgeon: Johnny Bridge, MD;  Location: [facility];  Service: Orthopedics;  Laterality: Right;    Family History  Problem Relation Age of Onset   Coronary artery disease Father    Diabetes Brother    Colon cancer Neg Hx    Colon polyps Neg Hx     Social History   Tobacco Use   Smoking status: Never   Smokeless tobacco: Never  Vaping Use   Vaping Use: Never used  Substance Use Topics   Alcohol use: No   Drug use: No    ROS   Objective:   Vitals: BP (!) 154/86 (BP Location: Right Arm)    Pulse 86    Temp 98.1 F (36.7 C) (Oral)    Resp 18    LMP 10/18/2016    SpO2 97%   Physical Exam Constitutional:      General: She is not in acute distress.    Appearance: Normal appearance. She is well-developed. She is not ill-appearing, toxic-appearing or diaphoretic.  HENT:     Head: Normocephalic and atraumatic.     Nose: Nose normal.     Mouth/Throat:     Mouth: Mucous membranes are moist.     Pharynx: Oropharynx is clear.  Eyes:     General: No scleral icterus.       Right eye: No discharge.        Left eye: No discharge.     Extraocular Movements: Extraocular movements intact.     Conjunctiva/sclera: Conjunctivae normal.     Pupils: Pupils are equal, round, and reactive to light.  Cardiovascular:     Rate and Rhythm: Normal rate.  Pulmonary:     Effort: Pulmonary effort  is normal.  Abdominal:     General: Bowel sounds are normal. There is no distension.     Palpations: Abdomen is soft. There is no mass.     Tenderness: There is no abdominal tenderness. There is no right CVA tenderness, left CVA tenderness, guarding or rebound.  Skin:    General: Skin is warm and dry.  Neurological:     General: No focal deficit present.     Mental Status: She is alert  and oriented to person, place, and time.  Psychiatric:        Mood and Affect: Mood normal.        Behavior: Behavior normal.        Thought Content: Thought content normal.        Judgment: Judgment normal.    Results for orders placed or performed during the hospital encounter of 03/31/21 (from the past 24 hour(s))  POCT urinalysis dipstick     Status: Abnormal   Collection Time: 03/31/21  1:28 PM  Result Value Ref Range   Color, UA yellow yellow   Clarity, UA clear clear   Glucose, UA negative negative mg/dL   Bilirubin, UA negative negative   Ketones, POC UA small (15) (A) negative mg/dL   Spec Grav, UA 1.020 1.010 - 1.025   Blood, UA moderate (A) negative   pH, UA 5.5 5.0 - 8.0   Protein Ur, POC negative negative mg/dL   Urobilinogen, UA 0.2 0.2 or 1.0 E.U./dL   Nitrite, UA Negative Negative   Leukocytes, UA Trace (A) Negative    Assessment and Plan :   PDMP not reviewed this encounter.  1. Acute cystitis with hematuria   2. Dysuria   3. Urinary frequency    Start Macrobid to cover for acute cystitis, urine culture pending.  Recommended aggressive hydration, limiting urinary irritants. Recheck with gynecologist especially if symptoms persist to see about repeat U/S to check on her fibroid as a source of the symptoms. Counseled patient on potential for adverse effects with medications prescribed/recommended today, ER and return-to-clinic precautions discussed, patient verbalized understanding.    Jaynee Eagles, PA-C 03/31/21 1346

## 2021-04-01 LAB — URINE CULTURE: Culture: <10000 — AB

## 2021-05-21 ENCOUNTER — Ambulatory Visit
Admission: EM | Admit: 2021-05-21 | Discharge: 2021-05-21 | Disposition: A | Discharge status: Home / Self Care | Payer: BC Managed Care – PPO | Attending: Urgent Care | Admitting: Urgent Care

## 2021-05-21 ENCOUNTER — Other Ambulatory Visit: Payer: Self-pay

## 2021-05-21 ENCOUNTER — Encounter: Payer: Self-pay | Admitting: Emergency Medicine

## 2021-05-21 DIAGNOSIS — Z9189 Other specified personal risk factors, not elsewhere classified: Secondary | ICD-10-CM | POA: Diagnosis not present

## 2021-05-21 DIAGNOSIS — B349 Viral infection, unspecified: Secondary | ICD-10-CM

## 2021-05-21 MED ORDER — BENZONATATE 100 MG PO CAPS: 100.0000 mg | ORAL_CAPSULE | Freq: Three times a day (TID) | ORAL | 0 refills | Status: DC | PRN

## 2021-05-21 MED ORDER — IPRATROPIUM BROMIDE 0.03 % NA SOLN: 2.0000 | Freq: Two times a day (BID) | NASAL | 0 refills | Status: DC

## 2021-05-21 MED ORDER — CETIRIZINE HCL 10 MG PO TABS: 10.0000 mg | ORAL_TABLET | Freq: Every day | ORAL | 0 refills | Status: AC

## 2021-05-21 MED ORDER — PROMETHAZINE-DM 6.25-15 MG/5ML PO SYRP: 5.0000 mL | ORAL_SOLUTION | Freq: Every evening | ORAL | 0 refills | Status: DC | PRN

## 2021-05-21 NOTE — Discharge Instructions (Signed)
We will notify you of your test results as they arrive and may take between 48-72 hours.  I encourage you to sign up for MyChart if you have not already done so as this can be the easiest way for [place] to communicate results to you online or through a phone app.  Generally, we only contact you if it is a positive test result.  In the meantime, if you develop worsening symptoms including fever, chest pain, shortness of breath despite our current treatment plan then please report to the emergency room as this may be a sign of worsening status from possible viral infection.  Otherwise, we will manage this as a viral syndrome. For sore throat or cough try using a honey-based tea. Use 3 teaspoons of honey with juice squeezed from half lemon. Place shaved pieces of ginger into 1/2-1 cup of water and warm over stove top. Then mix the ingredients and repeat every 4 hours as needed. Please take Tylenol 500mg -650mg  every 6 hours for aches and pains, fevers. Hydrate very well with at least 2 liters of water. Eat light meals such as soups to replenish electrolytes and soft fruits, veggies. Start an antihistamine like Zyrtec for postnasal drainage, sinus congestion.  You can take this together with the nasal spray and cough medications.

## 2021-05-21 NOTE — ED Provider Notes (Signed)
[place]   MRN: [id number] DOB: 07-Dec-1960  Subjective:   Maria Holt is a 61 y.o. female presenting for 5-day history of acute onset persistent intermittent sinus congestion, bilateral ear fullness and pain, persistent coughing.  Feels like her sense of smell is off.  Has a history of COVID-19 and reports that the previous infections were much worse than this current episode.  No current facility-administered medications for this encounter.  Current Outpatient Medications:    acetaminophen (TYLENOL) 500 MG tablet, Take 2 tablets (1,000 mg total) by mouth every 8 (eight) hours. (Patient taking differently: Take 1,000 mg by mouth as needed. ), Disp: 30 tablet, Rfl: 0   Ascorbic Acid (VITAMIN C) 1000 MG tablet, Take 1,000 mg by mouth daily., Disp: , Rfl:    BIOTIN PO, Take by mouth daily., Disp: , Rfl:    calcium carbonate (OS-CAL) 600 MG TABS, Take 1,200 mg by mouth daily., Disp: , Rfl:    Cholecalciferol (D3 PO), Take by mouth daily., Disp: , Rfl:    hydroxychloroquine (PLAQUENIL) 200 MG tablet, Take by mouth once a week., Disp: , Rfl:    ibuprofen (ADVIL) 200 MG tablet, Take 200 mg by mouth every 6 (six) hours as needed. Takes 400 mg daily, Disp: , Rfl:    lisinopril-hydrochlorothiazide (PRINZIDE,ZESTORETIC) 10-12.5 MG per tablet, Take 1 tablet by mouth every morning. , Disp: , Rfl:    methocarbamol (ROBAXIN) 500 MG tablet, Take 1 tablet (500 mg total) by mouth 3 (three) times daily., Disp: 21 tablet, Rfl: 0   Multiple Vitamins-Minerals (MULTIVITAMIN WITH MINERALS) tablet, Take 1 tablet by mouth daily., Disp: , Rfl:    Multiple Vitamins-Minerals (ZINC PO), Take by mouth daily., Disp: , Rfl:    nitrofurantoin, macrocrystal-monohydrate, (MACROBID) 100 MG capsule, Take 1 capsule (100 mg total) by mouth 2 (two) times daily., Disp: 6 capsule, Rfl: 0   Omega-3 Fatty Acids (FISH OIL) 1000 MG CAPS, Take by mouth daily., Disp: , Rfl:    pantoprazole (PROTONIX) 40 MG tablet,  Take 1 tablet (40 mg total) by mouth daily. Before breakfast, Disp: 30 tablet, Rfl: 5   tiZANidine (ZANAFLEX) 4 MG tablet, Take 4 mg by mouth as needed., Disp: , Rfl:    traMADol (ULTRAM) 50 MG tablet, Take 1 tablet (50 mg total) by mouth every 6 (six) hours as needed., Disp: 12 tablet, Rfl: 0   triamcinolone cream (KENALOG) 0.1 %, Apply 1 application topically as needed (Dry Skin). For eczema, Disp: , Rfl:    Allergies  Allergen Reactions   Dilaudid [Hydromorphone Hcl] Hives and Other (See Comments)    "I don't remember the reaction; just know it was a no no" (03/19/2012)   Vancomycin Other (See Comments)    Severe red-man-like rxn.  Pt reportedly declines further vancomycin therapy.   Demerol [Meperidine] Hives   Morphine And Related Hives   Hibiclens [Chlorhexidine Gluconate]     CHG wipes make her itch, does ok with hcg showers with liquid   Sulfa Antibiotics Other (See Comments)    "don't remember the reaction; Dr. 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     EXCISIONAL TOTAL KNEE ARTHROPLASTY WITH ANTIBIOTIC SPACERS Right 06/23/2013   Procedure: RIGHT RESECTION KNEE ARTHROPLASTY ;  Surgeon: Mauri Pole, MD;  Location: [facility];  Service:  Orthopedics;  Laterality: Right;   I & D KNEE WITH POLY EXCHANGE  04/19/2012   Procedure: IRRIGATION AND DEBRIDEMENT KNEE WITH POLY EXCHANGE;  Surgeon: Johnny Bridge, MD;  Location: [place];  Service: Orthopedics;  Laterality: Right;  irrigation and debridement right knee arthroplasty with poly exhange   KNEE ARTHROSCOPY  07/2001   RIGHT   REIMPLANTATION OF TOTAL KNEE Right 08/18/2013   Procedure: RIGHT TOTAL KNEE REIMPLANTATION AND REMOVAL OF ANTIBIOTIC SPACER;  Surgeon: Mauri Pole, MD;  Location[facility];  Service: Orthopedics;  Laterality: Right;   TOTAL KNEE ARTHROPLASTY  03/19/2012   "right" (03/19/2012)   TOTAL KNEE ARTHROPLASTY  03/19/2012   Procedure: TOTAL KNEE ARTHROPLASTY;  Surgeon: Johnny Bridge, MD;  Location: [place];  Service: Orthopedics;  Laterality: Right;    Family History  Problem Relation Age of Onset   Coronary artery disease Father    Diabetes Brother    Colon cancer Neg Hx    Colon polyps Neg Hx     Social History   Tobacco Use   Smoking status: Never   Smokeless tobacco: Never  Vaping Use   Vaping Use: Never used  Substance Use Topics   Alcohol use: No   Drug use: No    ROS   Objective:   Vitals: BP 133/82 (BP Location: Right Arm)    Pulse 92    Temp 98.5 F (36.9 C) (Oral)    Resp 18    LMP 10/18/2016    SpO2 97%   Physical Exam Constitutional:      General: She is not in acute distress.    Appearance: Normal appearance. She is well-developed. She is not ill-appearing, toxic-appearing or diaphoretic.  HENT:     Head: Normocephalic and atraumatic.     Right Ear: External ear normal.     Left Ear: External ear normal.     Nose: Nose normal.     Mouth/Throat:     Mouth: Mucous membranes are moist.     Pharynx: No oropharyngeal exudate or posterior oropharyngeal erythema.  Eyes:     General: No scleral icterus.       Right eye: No discharge.        Left eye: No discharge.     Extraocular Movements: Extraocular movements intact.   Cardiovascular:     Rate and Rhythm: Normal rate.     Heart sounds: No murmur heard.   No friction rub. No gallop.  Pulmonary:     Effort: Pulmonary effort is normal. No respiratory distress.     Breath sounds: No stridor. No wheezing, rhonchi or rales.  Chest:     Chest wall: No tenderness.  Skin:    General: Skin is warm and dry.  Neurological:     General: No focal deficit present.     Mental Status: She  is alert and oriented to person, place, and time.  Psychiatric:        Mood and Affect: Mood normal.        Behavior: Behavior normal.        Thought Content: Thought content normal.        Judgment: Judgment normal.    Assessment and Plan :   PDMP not reviewed this encounter.  1. Acute viral syndrome   2. At increased risk of exposure to COVID-19 virus    Deferred imaging given clear cardiopulmonary exam, hemodynamically stable vital signs. COVID and flu test pending.  We will otherwise manage for viral upper respiratory infection.  Physical exam findings reassuring and vital signs stable for discharge. Advised supportive care, offered symptomatic relief. Counseled patient on potential for adverse effects with medications prescribed/recommended today, ER and return-to-clinic precautions discussed, patient verbalized understanding.      Jaynee Eagles, PA-C 05/21/21 1425

## 2021-05-21 NOTE — ED Triage Notes (Signed)
Nasal congestion and fever since Wednesday.  Feel fatigued  and has a cough.  Can't smell that well.  Sore throat with some ear pain on right side.

## 2021-05-22 LAB — COVID-19, FLU A+B NAA
Influenza A, NAA: NOT DETECTED
Influenza B, NAA: NOT DETECTED
SARS-CoV-2, NAA: DETECTED — AB

## 2021-06-27 ENCOUNTER — Other Ambulatory Visit: Payer: Self-pay

## 2021-06-27 ENCOUNTER — Ambulatory Visit
Admission: EM | Admit: 2021-06-27 | Discharge: 2021-06-27 | Disposition: A | Discharge status: Home / Self Care | Payer: BC Managed Care – PPO | Attending: Urgent Care | Admitting: Urgent Care

## 2021-06-27 ENCOUNTER — Encounter: Payer: Self-pay | Admitting: Emergency Medicine

## 2021-06-27 DIAGNOSIS — J069 Acute upper respiratory infection, unspecified: Secondary | ICD-10-CM | POA: Diagnosis not present

## 2021-06-27 DIAGNOSIS — R519 Headache, unspecified: Secondary | ICD-10-CM | POA: Diagnosis not present

## 2021-06-27 DIAGNOSIS — R052 Subacute cough: Secondary | ICD-10-CM | POA: Diagnosis not present

## 2021-06-27 MED ORDER — LEVOCETIRIZINE DIHYDROCHLORIDE 5 MG PO TABS: 5.0000 mg | ORAL_TABLET | Freq: Every evening | ORAL | 0 refills | Status: DC

## 2021-06-27 MED ORDER — PREDNISONE 20 MG PO TABS: ORAL_TABLET | ORAL | 0 refills | Status: DC

## 2021-06-27 MED ORDER — PROMETHAZINE-DM 6.25-15 MG/5ML PO SYRP: 5.0000 mL | ORAL_SOLUTION | Freq: Four times a day (QID) | ORAL | 0 refills | Status: DC | PRN

## 2021-06-27 NOTE — ED Triage Notes (Addendum)
Pt reports fever, cough, sinus congestion, diarrhea since last tuesday. Pt reports has taken immodium and diarrhea has subsided. Pt reports alternating otc tylenol and ibuprofen and fever has been coming and going.  ? ? ?Pt reports "I think this is turning into sinus issues." When asked about covid/flu test. ?

## 2021-06-27 NOTE — ED Provider Notes (Signed)
Maria Holt   MRN: [id number] DOB: 1960-06-03  Subjective:   Maria Holt is a 61 y.o. female presenting for 5-6 day history of acute onset persistent malaise, sinus congestion, sinus pressure, sinus headaches, bloody mucus from her nose, coughing, diarrhea.  She has had 1 sick contact with her husband who was seen twice last week and all his testing was negative.  No chest pain, shortness of breath or wheezing, facial pain, ear pain, throat pain.  She is not a smoker.  No history of asthma.  She is using Claritin for her allergies.  No current facility-administered medications for this encounter.  Current Outpatient Medications:    acetaminophen (TYLENOL) 500 MG tablet, Take 2 tablets (1,000 mg total) by mouth every 8 (eight) hours. (Patient taking differently: Take 1,000 mg by mouth as needed. ), Disp: 30 tablet, Rfl: 0   Ascorbic Acid (VITAMIN C) 1000 MG tablet, Take 1,000 mg by mouth daily., Disp: , Rfl:    benzonatate (TESSALON) 100 MG capsule, Take 1-2 capsules (100-200 mg total) by mouth 3 (three) times daily as needed for cough., Disp: 60 capsule, Rfl: 0   BIOTIN PO, Take by mouth daily., Disp: , Rfl:    calcium carbonate (OS-CAL) 600 MG TABS, Take 1,200 mg by mouth daily., Disp: , Rfl:    cetirizine (ZYRTEC ALLERGY) 10 MG tablet, Take 1 tablet (10 mg total) by mouth daily., Disp: 30 tablet, Rfl: 0   Cholecalciferol (D3 PO), Take by mouth daily., Disp: , Rfl:    hydroxychloroquine (PLAQUENIL) 200 MG tablet, Take by mouth once a week., Disp: , Rfl:    ibuprofen (ADVIL) 200 MG tablet, Take 200 mg by mouth every 6 (six) hours as needed. Takes 400 mg daily, Disp: , Rfl:    ipratropium (ATROVENT) 0.03 % nasal spray, Place 2 sprays into both nostrils 2 (two) times daily., Disp: 30 mL, Rfl: 0   lisinopril-hydrochlorothiazide (PRINZIDE,ZESTORETIC) 10-12.5 MG per tablet, Take 1 tablet by mouth every morning. , Disp: , Rfl:    methocarbamol (ROBAXIN) 500 MG tablet, Take 1  tablet (500 mg total) by mouth 3 (three) times daily., Disp: 21 tablet, Rfl: 0   Multiple Vitamins-Minerals (MULTIVITAMIN WITH MINERALS) tablet, Take 1 tablet by mouth daily., Disp: , Rfl:    Multiple Vitamins-Minerals (ZINC PO), Take by mouth daily., Disp: , Rfl:    nitrofurantoin, macrocrystal-monohydrate, (MACROBID) 100 MG capsule, Take 1 capsule (100 mg total) by mouth 2 (two) times daily., Disp: 6 capsule, Rfl: 0   Omega-3 Fatty Acids (FISH OIL) 1000 MG CAPS, Take by mouth daily., Disp: , Rfl:    pantoprazole (PROTONIX) 40 MG tablet, Take 1 tablet (40 mg total) by mouth daily. Before breakfast, Disp: 30 tablet, Rfl: 5   promethazine-dextromethorphan (PROMETHAZINE-DM) 6.25-15 MG/5ML syrup, Take 5 mLs by mouth at bedtime as needed for cough., Disp: 100 mL, Rfl: 0   tiZANidine (ZANAFLEX) 4 MG tablet, Take 4 mg by mouth as needed., Disp: , Rfl:    traMADol (ULTRAM) 50 MG tablet, Take 1 tablet (50 mg total) by mouth every 6 (six) hours as needed., Disp: 12 tablet, Rfl: 0   triamcinolone cream (KENALOG) 0.1 %, Apply 1 application topically as needed (Dry Skin). For eczema, Disp: , Rfl:    Allergies  Allergen Reactions   Dilaudid [Hydromorphone Hcl] Hives and Other (See Comments)    "I don't remember the reaction; just know it was a no no" (03/19/2012)   Vancomycin Other (See Comments)    Severe red-man-like  rxn.  Pt reportedly declines further vancomycin therapy.   Demerol [Meperidine] Hives   Morphine And Related Hives   Hibiclens [Chlorhexidine Gluconate]     CHG wipes make her itch, does ok with hcg showers with liquid   Sulfa Antibiotics Other (See Comments)    "don't remember the reaction; Dr. 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     EXCISIONAL TOTAL KNEE ARTHROPLASTY WITH ANTIBIOTIC SPACERS Right 06/23/2013   Procedure: RIGHT RESECTION KNEE ARTHROPLASTY ;  Surgeon: Mauri Pole, MD;  Location: [facility];  Service: Orthopedics;  Laterality: Right;   I & D KNEE WITH POLY EXCHANGE  04/19/2012   Procedure: IRRIGATION AND DEBRIDEMENT KNEE WITH POLY EXCHANGE;  Surgeon: Johnny Bridge, MD;  Location: [facility];  Service: Orthopedics;  Laterality: Right;  irrigation and debridement right knee arthroplasty with poly exhange   KNEE ARTHROSCOPY  07/2001   RIGHT   REIMPLANTATION OF TOTAL KNEE Right 08/18/2013   Procedure: RIGHT TOTAL KNEE REIMPLANTATION AND REMOVAL OF ANTIBIOTIC SPACER;  Surgeon: Mauri Pole, MD;  Location: [facility];  Service: Orthopedics;  Laterality: Right;   TOTAL KNEE ARTHROPLASTY  03/19/2012   "right" (03/19/2012)   TOTAL KNEE ARTHROPLASTY  03/19/2012   Procedure: TOTAL KNEE ARTHROPLASTY;  Surgeon: Johnny Bridge, MD;  Location: [place];  Service: Orthopedics;  Laterality: Right;    Family History  Problem Relation Age of Onset   Coronary artery disease Father    Diabetes Brother    Colon cancer Neg Hx    Colon polyps Neg Hx     Social History   Tobacco Use   Smoking status: Never   Smokeless tobacco: Never  Vaping Use   Vaping Use: Never used  Substance Use Topics   Alcohol use: No   Drug use: No    ROS   Objective:   Vitals: BP 136/81 (BP Location: Right Arm)    Pulse 80    Temp 98.2 F (36.8 C)  (Oral)    Resp 18    Ht '5\' 5"'$  (1.651 m)    Wt 238 lb (108 kg)    LMP 10/18/2016    SpO2 94%    BMI 39.61 kg/m   Physical Exam Constitutional:      General: She is not in acute distress.    Appearance: Normal appearance. She is well-developed and normal weight. She is not ill-appearing, toxic-appearing or diaphoretic.  HENT:     Head: Normocephalic and atraumatic.     Right Ear: Tympanic membrane, ear canal and external ear normal. No drainage or tenderness. No middle ear effusion. There is no impacted cerumen. Tympanic membrane is  not erythematous.     Left Ear: Tympanic membrane, ear canal and external ear normal. No drainage or tenderness.  No middle ear effusion. There is no impacted cerumen. Tympanic membrane is not erythematous.     Nose: Congestion present. No rhinorrhea.     Mouth/Throat:     Mouth: Mucous membranes are moist. No oral lesions.     Pharynx: No pharyngeal swelling, oropharyngeal exudate, posterior oropharyngeal erythema or uvula swelling.     Tonsils: No tonsillar exudate or tonsillar abscesses.  Eyes:     General: No scleral icterus.       Right eye: No discharge.        Left eye: No discharge.     Extraocular Movements: Extraocular movements intact.     Right eye: Normal extraocular motion.     Left eye: Normal extraocular motion.     Conjunctiva/sclera: Conjunctivae normal.  Cardiovascular:     Rate and Rhythm: Normal rate.     Heart sounds: No murmur heard.   No friction rub. No gallop.  Pulmonary:     Effort: Pulmonary effort is normal. No respiratory distress.     Breath sounds: No stridor. No wheezing, rhonchi or rales.  Chest:     Chest wall: No tenderness.  Musculoskeletal:     Cervical back: Normal range of motion and neck supple.  Lymphadenopathy:     Cervical: No cervical adenopathy.  Skin:    General: Skin is warm and dry.  Neurological:     General: No focal deficit present.     Mental Status: She is alert and oriented to person, place, and  time.  Psychiatric:        Mood and Affect: Mood normal.        Behavior: Behavior normal.    Assessment and Plan :   PDMP not reviewed this encounter.  1. Viral upper respiratory infection   2. Sinus headache   3. Subacute cough    Given timeline of her illness, deferred respiratory testing for COVID and flu. Deferred imaging given clear cardiopulmonary exam, hemodynamically stable vital signs.  Patient would like aggressive management and therefore I offered an oral prednisone course for suspected viral URI, viral syndrome. Physical exam findings reassuring and vital signs stable for discharge. Advised supportive care, offered symptomatic relief. Counseled patient on potential for adverse effects with medications prescribed/recommended today, ER and return-to-clinic precautions discussed, patient verbalized understanding.     Jaynee Eagles, PA-C 06/27/21 [id number]

## 2022-05-09 ENCOUNTER — Other Ambulatory Visit (HOSPITAL_COMMUNITY): Payer: Self-pay | Admitting: Family Medicine

## 2022-05-09 DIAGNOSIS — R7989 Other specified abnormal findings of blood chemistry: Secondary | ICD-10-CM

## 2022-05-09 DIAGNOSIS — M7989 Other specified soft tissue disorders: Secondary | ICD-10-CM

## 2022-05-10 ENCOUNTER — Ambulatory Visit (HOSPITAL_COMMUNITY)
Admission: RE | Admit: 2022-05-10 | Discharge: 2022-05-10 | Disposition: A | Discharge status: Home / Self Care | Payer: BC Managed Care – PPO | Source: Ambulatory Visit | Attending: Family Medicine | Admitting: Family Medicine

## 2022-05-10 DIAGNOSIS — M7989 Other specified soft tissue disorders: Secondary | ICD-10-CM | POA: Insufficient documentation

## 2022-05-10 DIAGNOSIS — R7989 Other specified abnormal findings of blood chemistry: Secondary | ICD-10-CM | POA: Insufficient documentation

## 2022-10-02 ENCOUNTER — Telehealth: Payer: Self-pay

## 2022-10-02 NOTE — Telephone Encounter (Signed)
I called PCP office and left a voicemail with medical records requesting pt records/ imaging from the last 12 months.  Our fax number was provided along with a call back number.

## 2022-10-02 NOTE — Telephone Encounter (Signed)
-----   Message from Donnita Falls, FNP sent at 10/02/2022  2:05 PM EDT ----- New patient coming Wednesday 10/04/22 for "interstitial cystitis". The scanned records from 09/05/22 are insufficient to support that diagnosis - please ask referring provider's office to send prior urinalyses, urine microscopy results, urine culture results, urology specialty visits notes, surgical records, any abdominal / pelvic imaging radiology reports within the past 12 months, etc. Thank you!

## 2022-10-02 NOTE — Progress Notes (Unsigned)
History of Present Illness: Maria Holt is a 62 y.o. female who presents today at [facility] as a new patient. All available relevant medical records have been reviewed.  - GU /GYN history: 1. Kidney stones. - 09/01/2020: CT [place] showed "Bilateral nonobstructing nephrolithiasis. No obstructive urolithiasis or hydronephrosis is seen at this time."  She reports chief complaint of ***recurrent UTls ***dysuria ***interstitial cystitis.   Per available prior records: - 08/31/2022: Seen at [facility] for dysuria. UA positive for blood; otherwise negative. Prescribed Macrobid for suspected UTI.   Urine culture results in past 12 months: - No ***positive urine culture results in past 12 months found per chart review.  She reports *** UTls in past 12 months. When present, UTI symptoms include ***increased urinary urgency, frequency, dysuria, ***.  Today: She {Actions; denies-reports:120008} bladder pain.  Symptoms have been present for ***how long ***Bladder pain is described as ***/10 in severity, ***constant / ***intermittent. She {Actions; denies-reports:120008} pain with bladder filling. She {Actions; denies-reports:120008} relief with voiding. She {Actions; denies-reports:120008} symptom exacerbation with certain foods/beverages including ***. Current treatment regimen includes *** with *** relief. Previous attempted treatment has included *** with *** relief.  She {Actions; denies-reports:120008} urgency. She {Actions; denies-reports:120008} ***increased urinary frequency (*** times per day and *** times at night). She {Actions; denies-reports:120008} dysuria.  She {Actions; denies-reports:120008} gross hematuria. She {Actions; denies-reports:120008} hesitancy. She {Actions; denies-reports:120008} the need to strain to void.  She {Actions; denies-reports:120008} sensations of incomplete emptying.  She {Actions; denies-reports:120008} urge  incontinence. She {Actions; denies-reports:120008} stress incontinence with ***cough/***laugh/***sneeze/***heavy lifting /***exercise. She reports the ***SUI / ***UUI is predominant.  She leaks *** times per ***. Wears *** ***pads/ ***diapers per day. She reports urinary incontinence {ACTION; IS/IS UJW:[id number]} significantly bothersome.   She {Actions; denies-reports:120008} flank pain. She {Actions; denies-reports:120008} abdominal pain. She {Actions; denies-reports:120008} fevers. She {Actions; denies-reports:120008} nausea/ vomiting.  She {Actions; denies-reports:120008} history of kidney stones. She {Actions; denies-reports:120008} history of pyelonephritis.  She {Actions; denies-reports:120008} being sexually active.  ***She {Actions; denies-reports:120008} dyspareunia.  She {Actions; denies-reports:120008} prior GU / ***GYN surgical procedures including ***   Fall Screening: Do you usually have a device to assist in your mobility? {yes/no:20286} ***cane / ***walker / ***wheelchair  Medications: Current Outpatient Medications  Medication Sig Dispense Refill   acetaminophen (TYLENOL) 500 MG tablet Take 2 tablets (1,000 mg total) by mouth every 8 (eight) hours. (Patient taking differently: Take 1,000 mg by mouth as needed. ) 30 tablet 0   Ascorbic Acid (VITAMIN C) 1000 MG tablet Take 1,000 mg by mouth daily.     benzonatate (TESSALON) 100 MG capsule Take 1-2 capsules (100-200 mg total) by mouth 3 (three) times daily as needed for cough. 60 capsule 0   BIOTIN PO Take by mouth daily.     calcium carbonate (OS-CAL) 600 MG TABS Take 1,200 mg by mouth daily.     cetirizine (ZYRTEC ALLERGY) 10 MG tablet Take 1 tablet (10 mg total) by mouth daily. 30 tablet 0   Cholecalciferol (D3 PO) Take by mouth daily.     hydroxychloroquine (PLAQUENIL) 200 MG tablet Take by mouth once a week.     ibuprofen (ADVIL) 200 MG tablet Take 200 mg by mouth every 6 (six) hours as needed. Takes 400 mg daily      ipratropium (ATROVENT) 0.03 % nasal spray Place 2 sprays into both nostrils 2 (two) times daily. 30 mL 0   levocetirizine (XYZAL) 5 MG tablet Take 1 tablet (5 mg total) by  mouth every evening. 30 tablet 0   lisinopril-hydrochlorothiazide (PRINZIDE,ZESTORETIC) 10-12.5 MG per tablet Take 1 tablet by mouth every morning.      methocarbamol (ROBAXIN) 500 MG tablet Take 1 tablet (500 mg total) by mouth 3 (three) times daily. 21 tablet 0   Multiple Vitamins-Minerals (MULTIVITAMIN WITH MINERALS) tablet Take 1 tablet by mouth daily.     Multiple Vitamins-Minerals (ZINC PO) Take by mouth daily.     nitrofurantoin, macrocrystal-monohydrate, (MACROBID) 100 MG capsule Take 1 capsule (100 mg total) by mouth 2 (two) times daily. 6 capsule 0   Omega-3 Fatty Acids (FISH OIL) 1000 MG CAPS Take by mouth daily.     pantoprazole (PROTONIX) 40 MG tablet Take 1 tablet (40 mg total) by mouth daily. Before breakfast 30 tablet 5   predniSONE (DELTASONE) 20 MG tablet Take 2 tablets daily with breakfast. 10 tablet 0   promethazine-dextromethorphan (PROMETHAZINE-DM) 6.25-15 MG/5ML syrup Take 5 mLs by mouth 4 (four) times daily as needed for cough. 100 mL 0   tiZANidine (ZANAFLEX) 4 MG tablet Take 4 mg by mouth as needed.     traMADol (ULTRAM) 50 MG tablet Take 1 tablet (50 mg total) by mouth every 6 (six) hours as needed. 12 tablet 0   triamcinolone cream (KENALOG) 0.1 % Apply 1 application topically as needed (Dry Skin). For eczema     No current facility-administered medications for this visit.    Allergies: Allergies  Allergen Reactions   Dilaudid [Hydromorphone Hcl] Hives and Other (See Comments)    "I don't remember the reaction; just know it was a no no" (03/19/2012)   Vancomycin Other (See Comments)    Severe red-man-like rxn.  Pt reportedly declines further vancomycin therapy.   Demerol [Meperidine] Hives   Morphine And Codeine Hives   Hibiclens [Chlorhexidine Gluconate]     CHG wipes make her itch, does  ok with hcg showers with liquid   Sulfa Antibiotics Other (See Comments)    "don't remember the reaction; Dr. 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     EXCISIONAL TOTAL KNEE ARTHROPLASTY WITH ANTIBIOTIC SPACERS Right 06/23/2013   Procedure: RIGHT RESECTION KNEE ARTHROPLASTY ;  Surgeon: Shelda Pal, MD;  Location: [facility];  Service: Orthopedics;  Laterality: Right;   I & D KNEE WITH POLY EXCHANGE  04/19/2012   Procedure: IRRIGATION AND DEBRIDEMENT KNEE WITH POLY EXCHANGE;  Surgeon: Eulas Post, MD;  Location: [facility];  Service: Orthopedics;  Laterality: Right;  irrigation and debridement right knee arthroplasty with poly exhange   KNEE ARTHROSCOPY  07/2001   RIGHT   REIMPLANTATION OF TOTAL KNEE Right 08/18/2013   Procedure: RIGHT TOTAL KNEE REIMPLANTATION  AND REMOVAL OF ANTIBIOTIC SPACER;  Surgeon: Shelda Pal, MD;  Location: [facility];  Service: Orthopedics;  Laterality: Right;   TOTAL KNEE ARTHROPLASTY  03/19/2012   "right" (03/19/2012)   TOTAL KNEE ARTHROPLASTY  03/19/2012   Procedure: TOTAL KNEE ARTHROPLASTY;  Surgeon: Eulas Post, MD;  Location: [facility];  Service: Orthopedics;  Laterality: Right;   Family History  Problem Relation Age of Onset    Coronary artery disease Father    Diabetes Brother    Colon cancer Neg Hx    Colon polyps Neg Hx    Social History   Socioeconomic History   Marital status: Married    Spouse name: Not on file   Number of children: Not on file   Years of education: Not on file   Highest education level: Not on file  Occupational History   Not on file  Tobacco Use   Smoking status: Never   Smokeless tobacco: Never  Vaping Use   Vaping Use: Never used  Substance and Sexual Activity   Alcohol use: No   Drug use: No   Sexual activity: Yes    Birth control/protection: Post-menopausal  Other Topics Concern   Not on file  Social History Narrative   Not on file   Social Determinants of Health   Financial Resource Strain: Low Risk  (12/25/2019)   Overall Financial Resource Strain (CARDIA)    Difficulty of Paying Living Expenses: Not hard at all  Food Insecurity: No Food Insecurity (12/25/2019)   Hunger Vital Sign    Worried About Running Out of Food in the Last Year: Never true    Ran Out of Food in the Last Year: Never true  Transportation Needs: No Transportation Needs (12/25/2019)   PRAPARE - Administrator, Civil Service (Medical): No    Lack of Transportation (Non-Medical): No  Physical Activity: Insufficiently Active (12/25/2019)   Exercise Vital Sign    Days of Exercise per Week: 4 days    Minutes of Exercise per Session: 20 min  Stress: No Stress Concern Present (12/25/2019)   [employer] of Occupational Health - Occupational Stress Questionnaire    Feeling of Stress : Not at all  Social Connections: Socially Integrated (12/25/2019)   Social Connection and Isolation Panel [NHANES]    Frequency of Communication with Friends and Family: Twice a week    Frequency of Social Gatherings with Friends and Family: Three times a week    Attends Religious Services: More than 4 times per year    Active Member of Clubs or Organizations: Yes    Attends Banker Meetings:  More than 4 times per year    Marital Status: Married  Catering manager Violence: Not At Risk (12/25/2019)   Humiliation, Afraid, Rape, and Kick questionnaire    Fear of Current or Ex-Partner: No    Emotionally Abused: No    Physically Abused: No    Sexually Abused: No    SUBJECTIVE  Review of Systems Constitutional: Patient ***denies any unintentional weight loss or change in strength lntegumentary: Patient ***denies any rashes or pruritus Eyes: Patient denies ***dry eyes ENT: Patient ***denies dry mouth Cardiovascular: Patient ***denies chest pain or syncope Respiratory: Patient ***denies shortness of breath Gastrointestinal: Patient ***denies nausea, vomiting, constipation, or diarrhea Musculoskeletal: Patient ***denies muscle cramps or weakness Neurologic: Patient ***denies convulsions or seizures Psychiatric: Patient ***denies memory problems Allergic/Immunologic: Patient ***denies recent allergic reaction(s) Hematologic/Lymphatic: Patient denies bleeding tendencies Endocrine: Patient ***denies heat/cold intolerance  GU: As  per HPI.  OBJECTIVE There were no vitals filed for this visit. There is no height or weight on file to calculate BMI.  Physical Examination  Constitutional: ***No obvious distress; patient is ***non-toxic appearing  Cardiovascular: ***No visible lower extremity edema.  Respiratory: The patient does ***not have audible wheezing/stridor; respirations do ***not appear labored  Gastrointestinal: Abdomen ***non-distended Musculoskeletal: ***Normal ROM of UEs  Skin: ***No obvious rashes/open sores  Neurologic: CN 2-12 grossly ***intact Psychiatric: Answered questions ***appropriately with ***normal affect  Hematologic/Lymphatic/Immunologic: ***No obvious bruises or sites of spontaneous bleeding  UA: {Desc; negative/positive:13464} *** WBC/hpf, *** RBC/hpf, bacteria (***) *** nitrites, *** leukocytes, *** blood PVR: *** ml  ASSESSMENT No diagnosis  found.  After careful consideration of all available data and history and physical examination this patient was diagnosed with interstitial cystitis. We discussed in detail the etiology of this condition. We reviewed normal bladder function and what is known and not known about IC/ PBS. We reviewed the need to treat the abnormal GAG layer, mast cell up-regulation, and neurogenic inflammation simultaneously in order to adequately treat all components of the IC symptom complex. We reviewed the benefits v. risks/burdens of the available treatment alternatives, the fact that no single treatment has been found effective for the majority of patients, and the fact that acceptable symptom control may require trials of multiple therapeutic options (including combination therapy) before it is achieved. We discussed the overlap of UTI versus IC symptoms and that we would always recommend obtaining a urine culture prior to antibiotic treatment in the future. We discussed antibiotic stewardship and goal to minimize risk for developing antibiotic resistance.  An individualized multimodal approach was developed utilizing recommendations from the AUA practice guideline on IC/BPS.  For irritative bladder/IC we reviewed treatment options including: IC diet to avoid irritative beverages and foods. Attempting to decrease stress and other exacerbating factors. Medication options including: Urinary analgesics (Pyridium or Uribel). We discussed importance of limiting Pyridum use to 3 days consecutively due to risk for methemoglobinemia and damage to bone health. Elmiron for treatment of GAG layer deficiencies. Side effects including headache, GI upset including diarrhea, and potential hair loss were discussed. The patient was advised that it may take up to six months for this therapy to affect the bladder. Hydroxyzine or OTC antihistamines (preferably Zyrtec or Xyzal) to minimize histamine-mediated bladder  inflammation. Singulair for mast cell/histamine stabilization. Medications such as Amitriptyline, Cymbalta, Trazodone, Gabapentin, or Lyrica to reduce neuropathic pain related to up-regulated nerve response in the bladder/pelvis. OTC supplements (such as CystoProtek, Cysta-Q, Bladder Renew, Prelief, Freeze-dried aloe vera). Bladder instillations routinely or as needed (at home/ in-office). Cystoscopy with hydrodistention. We discussed the potential diagnostic and therapeutic benefits of procedure. The patient was told that there is approximately 60% chance of symptomatic improvement following bladder hydrodistention and that the response can be quite variable.  For high-tone pelvic floor dysfunction secondary to IC we reviewed treatment options including: Oral muscle relaxants (such as Baclofen, Flexeril, Zanaflex). Side effects including sedation were discussed. Vaginal or rectal Valium suppositories Referral to pelvic floor physical therapy  ***We discussed potential concerns regarding macular retinopathy / vision problems with Elmiron use. Although we are not aware of any substantial evidence of such a correlation at this time, we discussed the recommendation for patients who are currently taking or who have previously taken Elmiron to obtain routine eye exams, to discuss their Elmiron use with their eye doctor, and to notify us promptly of any concerns. We discussed that currently Elmiron continues to  be approved by the FDA and AUA as a therapy option for IC. May consider discontinuation if visual symptoms arise or if pt is uncomfortable with continuing Elmiron. May consider alternatives including Elmiron bladder instillations and OTC options Cystoprotek and Cysta-Q.  We discussed possible contributing factors for IC flare ups such as: stress dietary triggers seasonal environmental allergens activities that put pressure on the pelvic area/ perineum (such as sexual intercourse, horse back  riding, long car rides)  She was advised that if/when UTI-like symptoms occur they can call our office to speak with a nurse, who can place an order for urinalysis (with reflex to urine culture). They may then proceed to a [facility] to provide their urine sample. This is required for evaluation in order for their Urology provider to make informed treatment recommendation(s), which may or may not include an antibiotic prescription.  Ultimately She elected to***  Will plan for follow up in *** or sooner if needed. Pt verbalized understanding and agreement. All questions were answered.  PLAN Advised the following: *** ***No follow-ups on file.  No orders of the defined types were placed in this encounter.   It has been explained that the patient is to follow regularly with their PCP in addition to all other providers involved in their care and to follow instructions provided by these respective offices. Patient advised to contact urology clinic if any urologic-pertaining questions, concerns, new symptoms or problems arise in the interim period.  There are no Patient Instructions on file for this visit.  Electronically signed by:  Donnita Falls, MSN, FNP-C, CUNP 10/02/2022 2:05 PM

## 2022-10-04 ENCOUNTER — Ambulatory Visit: Payer: BC Managed Care – PPO | Admitting: Urology

## 2022-10-04 ENCOUNTER — Encounter: Payer: Self-pay | Admitting: Urology

## 2022-10-04 VITALS — BP 99/54 | HR 80 | Temp 98.5°F

## 2022-10-04 DIAGNOSIS — R350 Frequency of micturition: Secondary | ICD-10-CM

## 2022-10-04 DIAGNOSIS — G4733 Obstructive sleep apnea (adult) (pediatric): Secondary | ICD-10-CM | POA: Insufficient documentation

## 2022-10-04 DIAGNOSIS — R300 Dysuria: Secondary | ICD-10-CM | POA: Diagnosis not present

## 2022-10-04 DIAGNOSIS — R102 Pelvic and perineal pain: Secondary | ICD-10-CM | POA: Diagnosis not present

## 2022-10-04 DIAGNOSIS — N200 Calculus of kidney: Secondary | ICD-10-CM

## 2022-10-04 DIAGNOSIS — Z87442 Personal history of urinary calculi: Secondary | ICD-10-CM

## 2022-10-04 DIAGNOSIS — R3129 Other microscopic hematuria: Secondary | ICD-10-CM

## 2022-10-04 DIAGNOSIS — R3915 Urgency of urination: Secondary | ICD-10-CM | POA: Diagnosis not present

## 2022-10-04 DIAGNOSIS — I10 Essential (primary) hypertension: Secondary | ICD-10-CM | POA: Insufficient documentation

## 2022-10-04 DIAGNOSIS — M545 Low back pain, unspecified: Secondary | ICD-10-CM | POA: Insufficient documentation

## 2022-10-04 LAB — MICROSCOPIC EXAMINATION: Bacteria, UA: NONE SEEN

## 2022-10-04 LAB — URINALYSIS, ROUTINE W REFLEX MICROSCOPIC
Bilirubin, UA: NEGATIVE
Glucose, UA: NEGATIVE
Ketones, UA: NEGATIVE
Leukocytes,UA: NEGATIVE
Nitrite, UA: NEGATIVE
Protein,UA: NEGATIVE
Specific Gravity, UA: <1.005 — ABNORMAL LOW (ref 1.005–1.030)
Urobilinogen, Ur: 0.2 mg/dL (ref 0.2–1.0)
pH, UA: 6 (ref 5.0–7.5)

## 2022-10-04 LAB — BLADDER SCAN AMB NON-IMAGING

## 2022-12-13 ENCOUNTER — Other Ambulatory Visit: Payer: BC Managed Care – PPO | Admitting: Urology

## 2023-01-03 ENCOUNTER — Encounter: Payer: Self-pay | Admitting: *Deleted

## 2023-02-03 ENCOUNTER — Encounter (HOSPITAL_COMMUNITY): Payer: Self-pay | Admitting: Emergency Medicine

## 2023-02-03 ENCOUNTER — Emergency Department (HOSPITAL_COMMUNITY)
Admission: EM | Admit: 2023-02-03 | Discharge: 2023-02-04 | Disposition: A | Discharge status: Home / Self Care | Payer: BC Managed Care – PPO | Attending: Emergency Medicine | Admitting: Emergency Medicine

## 2023-02-03 ENCOUNTER — Other Ambulatory Visit: Payer: Self-pay

## 2023-02-03 DIAGNOSIS — L03011 Cellulitis of right finger: Secondary | ICD-10-CM | POA: Insufficient documentation

## 2023-02-03 DIAGNOSIS — M79644 Pain in right finger(s): Secondary | ICD-10-CM | POA: Diagnosis present

## 2023-02-03 DIAGNOSIS — L0390 Cellulitis, unspecified: Secondary | ICD-10-CM

## 2023-02-03 NOTE — ED Triage Notes (Signed)
Pt states she has a hx of exemia but now feels like her right hand is becoming infected.  She also has a pain described as a muscle ache under her right arm which has gotten so bad she can barely raise her arm.  She states she soaked her hand in diluted bleach earlier to stop the pain.

## 2023-02-04 MED ORDER — CEPHALEXIN 500 MG PO CAPS: 500.0000 mg | ORAL_CAPSULE | Freq: Four times a day (QID) | ORAL | 0 refills | Status: DC

## 2023-02-04 MED ORDER — CEPHALEXIN 500 MG PO CAPS
500.0000 mg | ORAL_CAPSULE | Freq: Once | ORAL | Status: AC
  Administered 2023-02-04: 500 mg via ORAL
  Filled 2023-02-04: qty 1

## 2023-02-04 NOTE — Discharge Instructions (Signed)
Begin taking Keflex as prescribed.  Continue eczema medications as previously prescribed.  Follow-up with primary doctor if not improving in the next few days, and return to the ER if symptoms significantly worsen or change.

## 2023-02-04 NOTE — ED Provider Notes (Signed)
[place] EMERGENCY DEPARTMENT AT [facility] Provider Note   CSN: [id number] Arrival date & time: 02/03/23  2219     History  Chief Complaint  Patient presents with   Blister    Maria Holt is a 62 y.o. female.  Patient is a 62 year old female with past medical history of eczema.  Patient presents with complaints of redness and pain in the third and fourth fingers of her right hand.  She has some dry skin in this area, however now it has become red, inflamed, and hot to the touch and she is worried about infection.  No fevers or chills.  She does describe some pain in the back of her upper arm, but denies any injury or trauma.  The history is provided by the patient.       Home Medications Prior to Admission medications   Medication Sig Start Date End Date Taking? Authorizing Provider  acetaminophen (TYLENOL) 500 MG tablet Take 2 tablets (1,000 mg total) by mouth every 8 (eight) hours. Patient taking differently: Take 1,000 mg by mouth as needed. 08/19/13   Clarene Critchley, PA-C  Ascorbic Acid (VITAMIN C) 1000 MG tablet Take 1,000 mg by mouth daily.    [provider]  BIOTIN PO Take by mouth daily.    [provider]  calcium carbonate (OS-CAL) 600 MG TABS Take 1,200 mg by mouth daily.    [provider]  cetirizine (ZYRTEC ALLERGY) 10 MG tablet Take 1 tablet (10 mg total) by mouth daily. 05/21/21   Wallis Bamberg, PA-C  Cholecalciferol (D3 PO) Take by mouth daily.    [provider]  ibuprofen (ADVIL) 200 MG tablet Take 200 mg by mouth every 6 (six) hours as needed. Takes 400 mg daily    [provider]  lisinopril-hydrochlorothiazide (PRINZIDE,ZESTORETIC) 10-12.5 MG per tablet Take 1 tablet by mouth every morning.     [provider]  Multiple Vitamins-Minerals (MULTIVITAMIN WITH MINERALS) tablet Take 1 tablet by mouth daily.    [provider]  Multiple Vitamins-Minerals (ZINC PO) Take by mouth daily.     [provider]  Omega-3 Fatty Acids (FISH OIL) 1000 MG CAPS Take by mouth daily.    [provider]  triamcinolone cream (KENALOG) 0.1 % Apply 1 application topically as needed (Dry Skin). For eczema    [provider]      Allergies    Dilaudid [hydromorphone hcl], Vancomycin, Demerol [meperidine], Morphine and codeine, Hibiclens [chlorhexidine gluconate], and Sulfa antibiotics    Review of Systems   Review of Systems  All other systems reviewed and are negative.   Physical Exam Updated Vital Signs BP (!) 157/81 (BP Location: Left Arm)   Pulse 73   Temp 99.6 F (37.6 C) (Oral)   Resp 18   Ht 5\' 5"  (1.651 m)   Wt 102.1 kg   LMP 10/18/2016   SpO2 97%   BMI 37.44 kg/m  Physical Exam Vitals and nursing note reviewed.  Constitutional:      Appearance: Normal appearance.  Pulmonary:     Effort: Pulmonary effort is normal.  Musculoskeletal:     Comments: There is a tender spot noted in the soft tissues of the back of her right upper arm, but no palpable abnormality.  Ulnar and radial pulses are palpable and motor and sensation are intact throughout the entire hand.  Skin:    General: Skin is warm and dry.     Comments: The third and fourth  fingers of the right hand are noted to have erythema, warmth, and clear drainage.    Neurological:     Mental Status: She is alert.     ED Results / Procedures / Treatments   Labs (all labs ordered are listed, but only abnormal results are displayed) Labs Reviewed - No data to display  EKG None  Radiology No results found.  Procedures Procedures    Medications Ordered in ED Medications  cephALEXin (KEFLEX) capsule 500 mg (has no administration in time range)    ED Course/ Medical Decision Making/ A&P  Patient presenting with redness and swelling of the fingers as described in the HPI.  Presentation most consistent with cellulitis.  This to be treated with Keflex and continued use of her eczema  medications.  Patient to follow-up with primary doctor/Derm if not improving.  Final Clinical Impression(s) / ED Diagnoses Final diagnoses:  None    Rx / DC Orders ED Discharge Orders     None         Geoffery Lyons, MD 02/04/23 0028

## 2023-08-13 ENCOUNTER — Other Ambulatory Visit: Payer: Self-pay

## 2023-08-13 ENCOUNTER — Emergency Department (HOSPITAL_COMMUNITY)

## 2023-08-13 ENCOUNTER — Encounter (HOSPITAL_COMMUNITY): Payer: Self-pay | Admitting: Emergency Medicine

## 2023-08-13 ENCOUNTER — Emergency Department (HOSPITAL_COMMUNITY)
Admission: EM | Admit: 2023-08-13 | Discharge: 2023-08-13 | Disposition: A | Discharge status: Home / Self Care | Attending: Emergency Medicine | Admitting: Emergency Medicine

## 2023-08-13 DIAGNOSIS — R112 Nausea with vomiting, unspecified: Secondary | ICD-10-CM | POA: Diagnosis not present

## 2023-08-13 DIAGNOSIS — E860 Dehydration: Secondary | ICD-10-CM | POA: Diagnosis not present

## 2023-08-13 DIAGNOSIS — Y92410 Unspecified street and highway as the place of occurrence of the external cause: Secondary | ICD-10-CM | POA: Insufficient documentation

## 2023-08-13 DIAGNOSIS — R55 Syncope and collapse: Secondary | ICD-10-CM | POA: Insufficient documentation

## 2023-08-13 LAB — URINALYSIS, ROUTINE W REFLEX MICROSCOPIC
Bilirubin Urine: NEGATIVE
Glucose, UA: NEGATIVE mg/dL
Ketones, ur: NEGATIVE mg/dL
Leukocytes,Ua: NEGATIVE
Nitrite: NEGATIVE
Protein, ur: >=300 mg/dL — AB
Specific Gravity, Urine: 1.026 (ref 1.005–1.030)
pH: 5 (ref 5.0–8.0)

## 2023-08-13 LAB — CBC WITH DIFFERENTIAL/PLATELET
Abs Immature Granulocytes: 0.05 10*3/uL (ref 0.00–0.07)
Basophils Absolute: 0 10*3/uL (ref 0.0–0.1)
Basophils Relative: 0 %
Eosinophils Absolute: 0.1 10*3/uL (ref 0.0–0.5)
Eosinophils Relative: 1 %
HCT: 54.4 % — ABNORMAL HIGH (ref 36.0–46.0)
Hemoglobin: 17.8 g/dL — ABNORMAL HIGH (ref 12.0–15.0)
Immature Granulocytes: 0 %
Lymphocytes Relative: 15 %
Lymphs Abs: 1.8 10*3/uL (ref 0.7–4.0)
MCH: 29.5 pg (ref 26.0–34.0)
MCHC: 32.7 g/dL (ref 30.0–36.0)
MCV: 90.2 fL (ref 80.0–100.0)
Monocytes Absolute: 0.4 10*3/uL (ref 0.1–1.0)
Monocytes Relative: 4 %
Neutro Abs: 9.5 10*3/uL — ABNORMAL HIGH (ref 1.7–7.7)
Neutrophils Relative %: 80 %
Platelets: 311 10*3/uL (ref 150–400)
RBC: 6.03 MIL/uL — ABNORMAL HIGH (ref 3.87–5.11)
RDW: 13.9 % (ref 11.5–15.5)
WBC: 11.9 10*3/uL — ABNORMAL HIGH (ref 4.0–10.5)
nRBC: 0 % (ref 0.0–0.2)

## 2023-08-13 LAB — TROPONIN I (HIGH SENSITIVITY)
Troponin I (High Sensitivity): 3 ng/L (ref <18)
Troponin I (High Sensitivity): 2 ng/L (ref <18)

## 2023-08-13 LAB — COMPREHENSIVE METABOLIC PANEL WITH GFR
ALT: 15 U/L (ref 0–44)
AST: 18 U/L (ref 15–41)
Albumin: 3.7 g/dL (ref 3.5–5.0)
Alkaline Phosphatase: 66 U/L (ref 38–126)
Anion gap: 14 (ref 5–15)
BUN: 16 mg/dL (ref 6–20)
CO2: 28 mmol/L (ref 22–32)
Calcium: 9 mg/dL (ref 8.9–10.3)
Chloride: 99 mmol/L (ref 98–111)
Creatinine, Ser: 1.05 mg/dL — ABNORMAL HIGH (ref 0.44–1.00)
GFR, Estimated: >60 mL/min (ref >60)
Glucose, Bld: 188 mg/dL — ABNORMAL HIGH (ref 70–99)
Potassium: 3.5 mmol/L (ref 3.5–5.1)
Sodium: 141 mmol/L (ref 135–145)
Total Bilirubin: 0.7 mg/dL (ref 0.0–1.2)
Total Protein: 7 g/dL (ref 6.5–8.1)

## 2023-08-13 LAB — LIPASE, BLOOD: Lipase: 41 U/L (ref 11–51)

## 2023-08-13 LAB — CBG MONITORING, ED: Glucose-Capillary: 197 mg/dL — ABNORMAL HIGH (ref 70–99)

## 2023-08-13 MED ORDER — SODIUM CHLORIDE 0.9 % IV BOLUS
1000.0000 mL | Freq: Once | INTRAVENOUS | Status: AC
  Administered 2023-08-13: 1000 mL via INTRAVENOUS

## 2023-08-13 MED ORDER — ONDANSETRON HCL 4 MG/2ML IJ SOLN
4.0000 mg | Freq: Once | INTRAMUSCULAR | Status: AC
  Administered 2023-08-13: 4 mg via INTRAVENOUS
  Filled 2023-08-13: qty 2
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
## 2023-08-13 NOTE — Discharge Instructions (Signed)
 Please follow-up closely with your primary care doctor on an outpatient basis.  Return to emergency department immediately for any new or worsening symptoms.
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
## 2023-08-13 NOTE — ED Notes (Signed)
 PT ambulated, no complaints, pt said they feel normal. No dizziness. Pt is back in bed and hooked up to monitors, pts bp is 128/62 (82)

## 2024-02-11 ENCOUNTER — Encounter: Payer: Self-pay | Admitting: Family Medicine

## 2024-02-11 ENCOUNTER — Ambulatory Visit: Admitting: Family Medicine

## 2024-02-11 VITALS — BP 146/92 | HR 73 | Temp 97.7°F | Ht 65.0 in | Wt 252.0 lb

## 2024-02-11 DIAGNOSIS — L2084 Intrinsic (allergic) eczema: Secondary | ICD-10-CM | POA: Diagnosis not present

## 2024-02-11 DIAGNOSIS — M545 Low back pain, unspecified: Secondary | ICD-10-CM

## 2024-02-11 DIAGNOSIS — Z1211 Encounter for screening for malignant neoplasm of colon: Secondary | ICD-10-CM

## 2024-02-11 DIAGNOSIS — I10 Essential (primary) hypertension: Secondary | ICD-10-CM

## 2024-02-11 DIAGNOSIS — G8929 Other chronic pain: Secondary | ICD-10-CM

## 2024-02-11 DIAGNOSIS — Z1212 Encounter for screening for malignant neoplasm of rectum: Secondary | ICD-10-CM

## 2024-02-11 MED ORDER — TIZANIDINE HCL 4 MG PO TABS: 4.0000 mg | ORAL_TABLET | Freq: Four times a day (QID) | ORAL | 1 refills | Status: AC

## 2024-02-11 MED ORDER — TRIAMCINOLONE ACETONIDE 0.1 % EX CREA: 1.0000 | TOPICAL_CREAM | CUTANEOUS | 1 refills | Status: AC | PRN

## 2024-02-11 MED ORDER — LISINOPRIL-HYDROCHLOROTHIAZIDE 10-12.5 MG PO TABS: 1.0000 | ORAL_TABLET | Freq: Every morning | ORAL | 0 refills | Status: DC

## 2024-02-11 MED ORDER — BETAMETHASONE DIPROPIONATE AUG 0.05 % EX OINT: TOPICAL_OINTMENT | Freq: Two times a day (BID) | CUTANEOUS | 1 refills | Status: AC

## 2024-02-11 NOTE — Patient Instructions (Signed)
 Welcome to [facility]! It was a pleasure meeting you today.  As discussed, Please schedule a 1 month follow up visit today.  Aquaphor ointment  PLEASE NOTE:  If you had any LAB tests please let us  know if you have not heard back within a few days. You may see your results on MyChart before we have a chance to review them but we will give you a call once they are reviewed by us . If we ordered any REFERRALS today, please let us  know if you have not heard from their office within the next week.  Let us  know through MyChart if you are needing REFILLS, or have your pharmacy send us  the request. You can also use MyChart to communicate with me or any office staff.  Please try these tips to maintain a healthy lifestyle:  Eat most of your calories during the day when you are active. Eliminate processed foods including packaged sweets (pies, cakes, cookies), reduce intake of potatoes, white bread, white pasta, and white rice. Look for whole grain options, oat flour or almond flour.  Each meal should contain half fruits/vegetables, one quarter protein, and one quarter carbs (no bigger than a computer mouse).  Cut down on sweet beverages. This includes juice, soda, and sweet tea. Also watch fruit intake, though this is a healthier sweet option, it still contains natural sugar! Limit to 3 servings daily.  Drink at least 1 glass of water  with each meal and aim for at least 8 glasses per day  Exercise at least 150 minutes every week.

## 2024-02-11 NOTE — Progress Notes (Signed)
 New Patient Office Visit  Subjective:  Patient ID: Maria Holt, female    DOB: 06/19/1960  Age: 63 y.o. MRN: [id number]  CC:  Chief Complaint  Patient presents with   New Patient (Initial Visit)    Est care; need some refills; been out of lisinopril  for about a month and a half; do not have OB, rather use family doctor for PAP    Discussed the use of AI scribe software for clinical note transcription with the patient, who gave verbal consent to proceed.  History of Present Illness Maria Holt is a 64 year old female with hypertension who presents for a new patient visit and medication refill.  She has been without her blood pressure medication, lisinopril  HCT 10/12.5 mg, for approximately two months due to the closure of her previous doctor's office. Her home blood pressure readings have been between 135-140 mmHg. She reports that these readings have increased since being off her medication. No headaches, dizziness, chest pain, or heart racing are reported, but she notes some swelling in her left foot, which is not worse than before stopping the medication but not bad.  She has a history of eczema, primarily affecting her right hand fingers, which has been persistent for about three years. The eczema occasionally flares up severely, leading to cellulitis in the past. She uses triamcinolone  cream, which helps but does not completely resolve the condition. Recently, she has noticed dryness and itching around her lips, which she manages with moisturizing creams.  She reports sleep apnea but does not use a CPAP machine due to discomfort. She has a history of multiple right knee surgeries due to infection and a discectomy at C5-C6. She experiences back pain due to bulging discs and occasionally uses tizanidine for flare-ups. She sees a land for her back issues.  She has urinary incontinence, particularly when sneezing or coughing, and sometimes feels she does not fully empty her  bladder. She drinks coffee in the morning and water  throughout the day.     Current Outpatient Medications:    acetaminophen  (TYLENOL ) 500 MG tablet, Take 2 tablets (1,000 mg total) by mouth every 8 (eight) hours. (Patient taking differently: Take 1,000 mg by mouth as needed.), Disp: 30 tablet, Rfl: 0   Ascorbic Acid (VITAMIN C) 1000 MG tablet, Take 1,000 mg by mouth daily., Disp: , Rfl:    augmented betamethasone dipropionate (DIPROLENE) 0.05 % ointment, Apply topically 2 (two) times daily., Disp: 30 g, Rfl: 1   BIOTIN PO, Take by mouth daily., Disp: , Rfl:    calcium  carbonate (OS-CAL) 600 MG TABS, Take 1,200 mg by mouth daily., Disp: , Rfl:    cetirizine  (ZYRTEC  ALLERGY) 10 MG tablet, Take 1 tablet (10 mg total) by mouth daily., Disp: 30 tablet, Rfl: 0   Cholecalciferol (D3 PO), Take by mouth daily., Disp: , Rfl:    ibuprofen  (ADVIL ) 200 MG tablet, Take 200 mg by mouth every 6 (six) hours as needed. Takes 400 mg daily, Disp: , Rfl:    Multiple Vitamins-Minerals (MULTIVITAMIN WITH MINERALS) tablet, Take 1 tablet by mouth daily., Disp: , Rfl:    Multiple Vitamins-Minerals (ZINC PO), Take by mouth daily., Disp: , Rfl:    Omega-3 Fatty Acids (FISH OIL) 1000 MG CAPS, Take by mouth daily., Disp: , Rfl:    lisinopril -hydrochlorothiazide  (ZESTORETIC ) 10-12.5 MG tablet, Take 1 tablet by mouth every morning., Disp: 90 tablet, Rfl: 0   tiZANidine (ZANAFLEX) 4 MG tablet, Take 1 tablet (4 mg total) by  mouth every 6 (six) hours., Disp: 30 tablet, Rfl: 1   triamcinolone  cream (KENALOG ) 0.1 %, Apply 1 Application topically as needed (Dry Skin). 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     EXCISIONAL TOTAL KNEE ARTHROPLASTY WITH ANTIBIOTIC SPACERS Right 06/23/2013   Procedure: RIGHT RESECTION KNEE ARTHROPLASTY ;  Surgeon: Donnice JONETTA Car, MD;  Location: [facility];  Service: Orthopedics;  Laterality: Right;   I & D KNEE WITH POLY EXCHANGE  04/19/2012   Procedure: IRRIGATION AND DEBRIDEMENT KNEE WITH POLY EXCHANGE;  Surgeon: Fonda SHAUNNA Olmsted, MD;  Location: [facility];  Service: Orthopedics;  Laterality: Right;  irrigation and debridement right knee arthroplasty with poly exhange   KNEE ARTHROSCOPY  07/2001   RIGHT   REIMPLANTATION OF TOTAL KNEE Right 08/18/2013   Procedure: RIGHT TOTAL KNEE REIMPLANTATION AND REMOVAL OF ANTIBIOTIC SPACER;  Surgeon: Donnice JONETTA Car, MD;  Location: [facility];  Service: Orthopedics;  Laterality: Right;   TOTAL KNEE ARTHROPLASTY  03/19/2012   right (03/19/2012)   TOTAL KNEE ARTHROPLASTY  03/19/2012   Procedure: TOTAL KNEE ARTHROPLASTY;  Surgeon: Fonda SHAUNNA Olmsted, MD;  Location: [facility];  Service: Orthopedics;  Laterality: Right;    Family History  Problem Relation Age of Onset   Heart disease Mother 8 - 64   Hypertension Father    Coronary artery disease Father 5 - 40       valve   Diabetes Brother    Cancer Maternal Grandmother 5 - 94       breast   Colon cancer Neg Hx    Colon polyps Neg Hx     Social History   Socioeconomic History   Marital status: Married    Spouse name: Not on file   Number of children: 1   Years of education: Not on file   Highest education level: Some college, no degree  Occupational History   Occupation:  audiological scientist  Tobacco Use   Smoking status: Never   Smokeless tobacco: Never  Vaping Use   Vaping status: Never Used  Substance and Sexual Activity   Alcohol use: No   Drug use: No   Sexual activity: Yes    Birth control/protection: Post-menopausal  Other Topics Concern   Not on file  Social History Narrative   1 child and 1 step so 6 grands total   Social Drivers of Health   Financial Resource Strain: Low Risk  (02/06/2024)   Overall Financial Resource Strain (CARDIA)    Difficulty of Paying Living Expenses: Not hard at all  Food Insecurity: No Food Insecurity (02/06/2024)   Hunger Vital Sign    Worried About Running Out of Food in the Last  Year: Never true    Ran Out of Food in the Last Year: Never true  Transportation Needs: No Transportation Needs (02/06/2024)   PRAPARE - Administrator, Civil Service (Medical): No    Lack of Transportation (Non-Medical): No  Physical Activity: Insufficiently Active (02/06/2024)   Exercise Vital Sign    Days of Exercise per Week: 1 day    Minutes of Exercise per Session: 10 min  Stress: No Stress Concern Present (02/06/2024)   [employer] of Occupational Health - Occupational Stress Questionnaire    Feeling of Stress: Not at all  Social Connections: Socially Integrated (02/06/2024)   Social Connection and Isolation Panel    Frequency of Communication with Friends and Family: Three times a week    Frequency of Social Gatherings with Friends and Family: Once a week    Attends Religious Services: More than 4 times per year    Active Member of [employer] or Organizations: Yes    Attends Engineer, Structural: More than 4 times per year    Marital Status: Married  Catering Manager Violence: Not At Risk (12/25/2019)   Humiliation, Afraid, Rape, and Kick questionnaire    Fear of Current or Ex-Partner: No    Emotionally Abused: No    Physically Abused: No    Sexually Abused: No    ROS  ROS: Gen: no fever, chills   Skin: no rash, itching ENT: no ear pain, ear drainage, nasal congestion, rhinorrhea, sinus pressure, sore throat Eyes: no blurry vision, double vision Resp: no cough, wheeze,SOB CV: no CP, palpitations, LE edema,  GI: no heartburn, n/v/d/c, abd pain GU: no dysuria, urgency, frequency, hematuria.  Som incont MSK: no joint pain, myalgias, back pain Neuro: no dizziness, headache, weakness, vertigo Psych: no depression, anxiety, insomnia, SI   Objective:   Today's Vitals: BP (!) 146/92 (BP Location: Left Arm, Cuff Size: Large)   Pulse 73   Temp 97.7 F (36.5 C)   Ht 5' 5 (1.651 m)   Wt 252 lb (114.3 kg)   LMP 10/18/2016   SpO2 98%   BMI 41.93 kg/m   Physical Exam  Gen: WDWN NAD HEENT: NCAT, conjunctiva not injected, sclera nonicteric  NECK:  supple, no thyromegaly, no nodes, no carotid bruits CARDIAC: RRR, S1S2+, no murmur. DP 2+B LUNGS: CTAB. No wheezes ABDOMEN:  BS+, soft, NTND, No HSM, no masses EXT:  no edema MSK: no gross abnormalities.  NEURO: A&O x3.  CN II-XII intact.  PSYCH: normal mood. Good eye contact   Dry, cracked, pink skin R 3,4,5th fingers more on palmer side.    Assessment & Plan:  Primary hypertension -     Lisinopril -hydroCHLOROthiazide ; Take 1 tablet by mouth every morning.  Dispense: 90 tablet; Refill: 0  Intrinsic eczema -     Triamcinolone  Acetonide; Apply 1 Application topically as needed (Dry Skin). For eczema  Dispense: 30 g; Refill: 1 -     Betamethasone Dipropionate Aug; Apply topically 2 (two) times daily.  Dispense: 30 g; Refill: 1  Chronic midline low back pain without sciatica -     tiZANidine HCl; Take 1 tablet (4 mg total) by mouth every 6 (six) hours.  Dispense: 30 tablet; Refill: 1  Screening for colorectal cancer -     Cologuard    Assessment and Plan Assessment & Plan Essential hypertension   Hypertension is poorly controlled due to discontinuation of lisinopril  HCT for two months. Current blood pressure is 146/92 mmHg,  with previous readings of  135-140 mmHg off medication. No symptoms of headaches, dizziness, or chest pain. Discussed genetic component and importance of regular monitoring. Restart lisinopril  HCT 10/12.5 mg daily. Monitor blood pressure at home regularly. Schedule follow-up in 4-6 weeks to reassess blood pressure control. Encourage lifestyle modifications including exercise and weight management. Informed about option to obtain temporary supply from urgent care in case of future medication shortages.  Obesity and insulin resistance   Obesity with weight increase after initial loss on a carnivore diet. Suspected insulin resistance contributing to weight management difficulty. Discussed exercise, dietary modifications, and role of insulin resistance. Encourage regular physical activity. Advise on dietary modifications focusing on lower carbohydrate intake. Discussed metformin and GLP-1 agonists as potential pharmacological interventions, emphasizing lifestyle changes.  Obstructive sleep apnea, not using CPAP   Diagnosed with obstructive sleep apnea but not using CPAP due to mask discomfort. Discussed risks of untreated sleep apnea, including impact on hypertension and atrial fibrillation risk.  Eczema of hands and lips   Chronic eczema affecting right hand and lips. History of severe flare leading to cellulitis. Current treatment with triamcinolone  is insufficient. Prescribe stronger steroid cream for hands(diprolene). Continue triamcinolone  for lips. Use Aquaphor ointment for lips and hands, especially at night. Consider dermatology referral if condition does not improve.  Urinary incontinence and incomplete bladder emptying   Experiencing stress incontinence and sensation of incomplete bladder emptying. Symptoms include urgency with sneezing or coughing and difficulty fully emptying bladder. Prefers to manage symptoms without additional medical intervention. Discussed lifestyle modifications and bladder  training techniques.  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 Pain management includes intermittent use of tizanidine and chiropractic care. Sciatica symptoms are intermittent. Continue as needed use of tizanidine. Continue chiropractic care.  General Health Maintenance   Due for mammogram and colon cancer screening. Last blood work within six months showed no abnormalities. Order Cologuard test. Advise scheduling mammogram. Plan for physical exam, Pap smear, and blood work at next visit.      Follow-up: Return in about 4 weeks (around 03/10/2024) for HTN and annual w/pap.   Jenkins CHRISTELLA Carrel, MD

## 2024-02-25 LAB — COLOGUARD: COLOGUARD: NEGATIVE

## 2024-02-26 ENCOUNTER — Ambulatory Visit: Payer: Self-pay | Admitting: Family Medicine

## 2024-02-27 ENCOUNTER — Ambulatory Visit

## 2024-03-12 ENCOUNTER — Ambulatory Visit: Payer: Self-pay

## 2024-03-12 ENCOUNTER — Emergency Department (HOSPITAL_COMMUNITY)

## 2024-03-12 ENCOUNTER — Encounter (HOSPITAL_COMMUNITY): Payer: Self-pay | Admitting: Emergency Medicine

## 2024-03-12 ENCOUNTER — Emergency Department (HOSPITAL_COMMUNITY)
Admission: EM | Admit: 2024-03-12 | Discharge: 2024-03-12 | Disposition: A | Discharge status: Home / Self Care | Attending: Emergency Medicine | Admitting: Emergency Medicine

## 2024-03-12 ENCOUNTER — Other Ambulatory Visit: Payer: Self-pay

## 2024-03-12 DIAGNOSIS — D72829 Elevated white blood cell count, unspecified: Secondary | ICD-10-CM | POA: Diagnosis not present

## 2024-03-12 DIAGNOSIS — R197 Diarrhea, unspecified: Secondary | ICD-10-CM | POA: Diagnosis not present

## 2024-03-12 DIAGNOSIS — R112 Nausea with vomiting, unspecified: Secondary | ICD-10-CM | POA: Diagnosis present

## 2024-03-12 DIAGNOSIS — I10 Essential (primary) hypertension: Secondary | ICD-10-CM | POA: Insufficient documentation

## 2024-03-12 DIAGNOSIS — Z79899 Other long term (current) drug therapy: Secondary | ICD-10-CM | POA: Diagnosis not present

## 2024-03-12 DIAGNOSIS — R109 Unspecified abdominal pain: Secondary | ICD-10-CM | POA: Insufficient documentation

## 2024-03-12 DIAGNOSIS — R55 Syncope and collapse: Secondary | ICD-10-CM | POA: Diagnosis not present

## 2024-03-12 LAB — COMPREHENSIVE METABOLIC PANEL WITH GFR
ALT: 12 U/L (ref 0–44)
AST: 16 U/L (ref 15–41)
Albumin: 4 g/dL (ref 3.5–5.0)
Alkaline Phosphatase: 77 U/L (ref 38–126)
Anion gap: 10 (ref 5–15)
BUN: 19 mg/dL (ref 8–23)
CO2: 28 mmol/L (ref 22–32)
Calcium: 9.2 mg/dL (ref 8.9–10.3)
Chloride: 103 mmol/L (ref 98–111)
Creatinine, Ser: 1.06 mg/dL — ABNORMAL HIGH (ref 0.44–1.00)
GFR, Estimated: 59 mL/min — ABNORMAL LOW (ref >60)
Glucose, Bld: 146 mg/dL — ABNORMAL HIGH (ref 70–99)
Potassium: 4 mmol/L (ref 3.5–5.1)
Sodium: 141 mmol/L (ref 135–145)
Total Bilirubin: 0.5 mg/dL (ref 0.0–1.2)
Total Protein: 7 g/dL (ref 6.5–8.1)

## 2024-03-12 LAB — CBC WITH DIFFERENTIAL/PLATELET
Abs Immature Granulocytes: 0.11 K/uL — ABNORMAL HIGH (ref 0.00–0.07)
Basophils Absolute: 0.1 K/uL (ref 0.0–0.1)
Basophils Relative: 0 %
Eosinophils Absolute: 0 K/uL (ref 0.0–0.5)
Eosinophils Relative: 0 %
HCT: 53.2 % — ABNORMAL HIGH (ref 36.0–46.0)
Hemoglobin: 17.8 g/dL — ABNORMAL HIGH (ref 12.0–15.0)
Immature Granulocytes: 1 %
Lymphocytes Relative: 6 %
Lymphs Abs: 1.2 K/uL (ref 0.7–4.0)
MCH: 29.2 pg (ref 26.0–34.0)
MCHC: 33.5 g/dL (ref 30.0–36.0)
MCV: 87.2 fL (ref 80.0–100.0)
Monocytes Absolute: 0.6 K/uL (ref 0.1–1.0)
Monocytes Relative: 3 %
Neutro Abs: 18.9 K/uL — ABNORMAL HIGH (ref 1.7–7.7)
Neutrophils Relative %: 90 %
Platelets: 281 K/uL (ref 150–400)
RBC: 6.1 MIL/uL — ABNORMAL HIGH (ref 3.87–5.11)
RDW: 13.5 % (ref 11.5–15.5)
WBC: 20.8 K/uL — ABNORMAL HIGH (ref 4.0–10.5)
nRBC: 0 % (ref 0.0–0.2)

## 2024-03-12 LAB — I-STAT CHEM 8, ED
BUN: 21 mg/dL (ref 8–23)
Calcium, Ion: 1.14 mmol/L — ABNORMAL LOW (ref 1.15–1.40)
Chloride: 100 mmol/L (ref 98–111)
Creatinine, Ser: 1.2 mg/dL — ABNORMAL HIGH (ref 0.44–1.00)
Glucose, Bld: 143 mg/dL — ABNORMAL HIGH (ref 70–99)
HCT: 53 % — ABNORMAL HIGH (ref 36.0–46.0)
Hemoglobin: 18 g/dL — ABNORMAL HIGH (ref 12.0–15.0)
Potassium: 3.9 mmol/L (ref 3.5–5.1)
Sodium: 142 mmol/L (ref 135–145)
TCO2: 26 mmol/L (ref 22–32)

## 2024-03-12 LAB — CBG MONITORING, ED: Glucose-Capillary: 150 mg/dL — ABNORMAL HIGH (ref 70–99)

## 2024-03-12 MED ORDER — LACTATED RINGERS IV BOLUS
1000.0000 mL | Freq: Once | INTRAVENOUS | Status: AC
  Administered 2024-03-12: 1000 mL via INTRAVENOUS

## 2024-03-12 MED ORDER — ONDANSETRON HCL 4 MG PO TABS
4.0000 mg | ORAL_TABLET | Freq: Four times a day (QID) | ORAL | 0 refills | Status: AC
Start: 2024-03-12 — End: ?

## 2024-03-12 MED ORDER — ONDANSETRON 4 MG PO TBDP: 4.0000 mg | ORAL_TABLET | Freq: Three times a day (TID) | ORAL | 0 refills | Status: AC | PRN

## 2024-03-12 MED ORDER — ONDANSETRON HCL 4 MG/2ML IJ SOLN
4.0000 mg | Freq: Once | INTRAMUSCULAR | Status: AC
  Administered 2024-03-12: 4 mg via INTRAVENOUS
  Filled 2024-03-12: qty 2

## 2024-03-12 NOTE — Discharge Instructions (Addendum)
 You were seen for your viral bug (gastroenteritis) and fainting in the emergency department.  At home, please take the Zofran  for your nausea and vomiting. Please be sure to stay well-hydrated.  Follow-up with your primary doctor in 2-3 days regarding your visit.  Return immediately to the emergency department if you experience any of the following: recurrent fainting, abdominal pain, high fevers, vomiting despite the medication, or any other concerning symptoms.  Thank you for visiting our Emergency Department. It was a pleasure taking care of you today.

## 2024-03-12 NOTE — Telephone Encounter (Signed)
 Wrong office

## 2024-03-12 NOTE — ED Provider Notes (Signed)
 [place] EMERGENCY DEPARTMENT AT [facility] Provider Note   CSN: [id number] Arrival date & time: 03/12/24  1322     Patient presents with: Loss of Consciousness   Maria Holt is a 63 y.o. female.   63 year old female with a history of hypertension who presents to the emergency department with syncope.  Patient reports that this morning she woke up and was feeling poorly.  Has had approximately 3 episodes of emesis and 5-6 episodes of loose stools.  There have been no blood in either of these.  No bilious emesis.  Says that she was sitting on the toilet and then woke up on the ground and thinks that she passed out.  No significant headache.  Has some mild abdominal cramping but no pain.  No abdominal surgeries.  No history of C. difficile.  No recent hospital stays or antibiotic use.  Has not eaten any food that he said she thinks could be related to it.  Was given 400 mL of NS by EMS and was found to be hypotensive       Prior to Admission medications   Medication Sig Start Date End Date Taking? Authorizing Provider  acetaminophen  (TYLENOL ) 500 MG tablet Take 2 tablets (1,000 mg total) by mouth every 8 (eight) hours. Patient taking differently: Take 1,000 mg by mouth every 8 (eight) hours as needed for moderate pain (pain score 4-6). 08/19/13  Yes Henry Bradley B, PA-C  augmented betamethasone  dipropionate (DIPROLENE ) 0.05 % ointment Apply topically 2 (two) times daily. Patient taking differently: Apply 1 Application topically daily. 02/11/24  Yes Wendolyn Jenkins Jansky, MD  cetirizine  (ZYRTEC  ALLERGY) 10 MG tablet Take 1 tablet (10 mg total) by mouth daily. 05/21/21  Yes Christopher Savannah, PA-C  Cholecalciferol (D3 PO) Take 1 capsule by mouth daily.   Yes [provider]  CREATINE PO Take 5 mg by mouth daily. For brain health   Yes [provider]  Desiccated Beef Liver POWD Take 1 capsule by mouth daily.   Yes [provider]  ibuprofen  (ADVIL ) 200 MG tablet  Take 400 mg by mouth every 6 (six) hours as needed for mild pain (pain score 1-3).   Yes [provider]  lisinopril -hydrochlorothiazide  (ZESTORETIC ) 10-12.5 MG tablet Take 1 tablet by mouth every morning. 02/11/24  Yes Wendolyn Jenkins Jansky, MD  Multiple Vitamins-Minerals (ZINC PO) Take 1 tablet by mouth at bedtime.   Yes [provider]  Specialty Vitamins Products (COLLAGEN ULTRA) CAPS Take 1 capsule by mouth every evening.   Yes [provider]  tiZANidine  (ZANAFLEX ) 4 MG tablet Take 1 tablet (4 mg total) by mouth every 6 (six) hours. Patient taking differently: Take 4 mg by mouth every 6 (six) hours as needed for muscle spasms. 02/11/24  Yes Wendolyn Jenkins Jansky, MD  triamcinolone  cream (KENALOG ) 0.1 % Apply 1 Application topically as needed (Dry Skin). For eczema 02/11/24  Yes Wendolyn Jenkins Jansky, MD    Allergies: Dilaudid  [hydromorphone  hcl], Vancomycin , Demerol [meperidine], Morphine and codeine, Sulfa antibiotics, and Hibiclens [chlorhexidine  gluconate]    Review of Systems  Updated Vital Signs BP 130/74   Pulse 68   Temp 98.3 F (36.8 C) (Oral)   Resp 17   LMP 10/18/2016   SpO2 95%   Physical Exam Vitals and nursing note reviewed.  Constitutional:      General: She is not in acute distress.    Appearance: She is well-developed.  HENT:     Head: Normocephalic and atraumatic.  Right Ear: External ear normal.     Left Ear: External ear normal.     Nose: Nose normal.  Eyes:     Extraocular Movements: Extraocular movements intact.     Conjunctiva/sclera: Conjunctivae normal.     Pupils: Pupils are equal, round, and reactive to light.  Cardiovascular:     Rate and Rhythm: Normal rate and regular rhythm.     Heart sounds: No murmur heard. Pulmonary:     Effort: Pulmonary effort is normal. No respiratory distress.     Breath sounds: Normal breath sounds.  Abdominal:     General: Abdomen is flat. There is no distension.     Palpations: Abdomen is soft.  There is no mass.     Tenderness: There is no abdominal tenderness. There is no guarding.  Musculoskeletal:     Cervical back: Normal range of motion and neck supple.     Right lower leg: No edema.     Left lower leg: No edema.  Skin:    General: Skin is warm and dry.  Neurological:     Mental Status: She is alert and oriented to person, place, and time. Mental status is at baseline.     Motor: No weakness.  Psychiatric:        Mood and Affect: Mood normal.     (all labs ordered are listed, but only abnormal results are displayed) Labs Reviewed  COMPREHENSIVE METABOLIC PANEL WITH GFR - Abnormal; Notable for the following components:      Result Value   Glucose, Bld 146 (*)    Creatinine, Ser 1.06 (*)    GFR, Estimated 59 (*)    All other components within normal limits  CBC WITH DIFFERENTIAL/PLATELET - Abnormal; Notable for the following components:   WBC 20.8 (*)    RBC 6.10 (*)    Hemoglobin 17.8 (*)    HCT 53.2 (*)    Neutro Abs 18.9 (*)    Abs Immature Granulocytes 0.11 (*)    All other components within normal limits  CBG MONITORING, ED - Abnormal; Notable for the following components:   Glucose-Capillary 150 (*)    All other components within normal limits  I-STAT CHEM 8, ED - Abnormal; Notable for the following components:   Creatinine, Ser 1.20 (*)    Glucose, Bld 143 (*)    Calcium , Ion 1.14 (*)    Hemoglobin 18.0 (*)    HCT 53.0 (*)    All other components within normal limits    EKG: EKG Interpretation Date/Time:  Wednesday March 12 2024 13:36:20 EST Ventricular Rate:  83 PR Interval:  131 QRS Duration:  99 QT Interval:  400 QTC Calculation: 470 R Axis:   88  Text Interpretation: Sinus rhythm Borderline right axis deviation Borderline repolarization abnormality Confirmed by Yolande Charleston [phone]) on 03/12/2024 2:05:13 PM  Radiology: CT Head Wo Contrast Result Date: 03/12/2024 EXAM: CT HEAD WITHOUT 03/12/2024 02:25:43 PM TECHNIQUE: CT of the  head was performed without the administration of intravenous contrast. Automated exposure control, iterative reconstruction, and/or weight based adjustment of the mA/kV was utilized to reduce the radiation dose to as low as reasonably achievable. COMPARISON: Head CT 08/13/2023 CLINICAL HISTORY: Syncope FINDINGS: BRAIN AND VENTRICLES: There is no evidence of an acute infarct, intracranial hemorrhage, mass, midline shift, hydrocephalus, or extra-axial fluid collection. Cerebral volume is within normal limits for age. ORBITS: No acute abnormality. SINUSES AND MASTOIDS: Mild chronic posterior left ethmoid air cell opacification with a small amount of chronic  nodular soft tissue in the posterosuperior left nasal cavity, potentially a small polyp. Clear mastoid air cells. SOFT TISSUES AND SKULL: No acute skull fracture. No acute soft tissue abnormality. IMPRESSION: 1. No acute intracranial abnormality. Electronically signed by: Dasie Hamburg MD 03/12/2024 03:13 PM EST RP Workstation: [id number]     Procedures   Medications Ordered in the ED  lactated ringers  bolus 1,000 mL (1,000 mLs Intravenous New Bag/Given 03/12/24 1428)  ondansetron  (ZOFRAN ) injection 4 mg (4 mg Intravenous Given 03/12/24 1431)                                    Medical Decision Making Amount and/or Complexity of Data Reviewed Labs: ordered. Radiology: ordered.  Risk Prescription drug management.   Maria Holt is a 63 year old female with history of hypertension presents to the emergency department with nausea, vomiting, diarrhea, and syncope  Initial Ddx:  Gastroenteritis, TBI, C-spine injury, concussion, vasovagal syncope, situational syncope, dehydration  MDM/Course:  Patient presents emergency department with nausea vomiting and diarrhea.  Also had a syncopal episode while she was going to the bathroom.  On exam initially was hypotensive but blood pressure resolved after 400 mL of fluid by EMS.  No significant  abdominal tenderness to palpation.  She is mentating well at this point in time and blood pressure has remained normal upon re-evaluation.  She was given more IV fluids as well as some Zofran .  CT head without acute abnormality.  Blood work shows white blood cell count of 20.  Hemoconcentrated with a hemoglobin of 18.  No signs of AKI at this point in time.  Suspect that the patient likely became volume down and had an episode of syncope from this.  Signed out to the oncoming physician Dr Cleotilde awaiting p.o. challenge and repeat evaluation.  This patient presents to the ED for concern of complaints listed in HPI, this involves an extensive number of treatment options, and is a complaint that carries with it a high risk of complications and morbidity. Disposition including potential need for admission considered.   Dispo: Pending remainder of workup  Additional history obtained from spouse Records reviewed Outpatient Clinic Notes The following labs were independently interpreted: Chemistry and show no acute abnormality I personally reviewed and interpreted cardiac monitoring: normal sinus rhythm  I personally reviewed and interpreted the pt's EKG: see above for interpretation  I have reviewed the patients home medications and made adjustments as needed  Portions of this note were generated with Dragon dictation software. Dictation errors may occur despite best attempts at proofreading.     Final diagnoses:  Syncope and collapse  Nausea and vomiting, unspecified vomiting type  Diarrhea, unspecified type    ED Discharge Orders     None          Yolande Lamar BROCKS, MD 03/12/24 1650

## 2024-03-12 NOTE — Telephone Encounter (Signed)
 FYI Only or Action Required?: FYI only for provider: ED advised.  Patient was last seen in primary care on 02/11/2024 by Maria Jenkins Jansky, MD.  Called Nurse Triage reporting Loss of Consciousness.  Symptoms began today.  Interventions attempted: Nothing.  Symptoms are: rapidly improving.  Triage Disposition: Go to ED Now (or PCP Triage)  Patient/caregiver understands and will follow disposition?: Yes     Copied from CRM [id number]. Topic: Clinical - Red Word Triage >> Mar 12, 2024 12:08 PM Victoria A wrote: [facility] that prompted transfer to Nurse Triage: Patient's wife passed out this morning , vomiting and diarrhea      Reason for Disposition  [1] Age > 50 years AND [2] now alert and feels fine  Answer Assessment - Initial Assessment Questions 1. ONSET: How long were you unconscious? (e.g., minutes, seconds) When did it happen?     Today 2. CONTENT: What happened during the period of unconsciousness? (e.g., seizure activity)      Unsure  3. MENTAL STATUS: Alert and oriented now? (e.g., oriented x 3 = name, month, location)      Awake and alert  4. TRIGGER: What do you think caused the fainting? What were you doing just before you fainted?  (e.g., exercise, sudden standing up, prolonged standing)     Was standing up after using the bathroom 5. RECURRENT SYMPTOM: Have you ever passed out before? If Yes, ask: When was the last time? and What happened that time?      No 6. INJURY: Did you hurt yourself when you fell?      No  7. CARDIAC SYMPTOMS: Have you had any of the following symptoms: chest pain, difficulty breathing, palpitations?     No 8. NEUROLOGIC SYMPTOMS: Have you had any of the following symptoms: headache, numbness, vertigo, weakness?     Intermittent dizziness  9. GI SYMPTOMS: Have you had any of the following symptoms: abdomen pain, vomiting, diarrhea, blood in stools?     Vomiting, diarrhea  10. OTHER SYMPTOMS: Do you have any  other symptoms?       Nausea  Protocols used: [employer]

## 2024-03-12 NOTE — ED Provider Notes (Signed)
 I have repeated the patient's abdominal exam, she is nontender, her vital signs are normal, her symptoms have completely resolved with IV fluids and nausea medication.  She is amenable to discharge after our discussion.  I did discuss with her the option of having a CT scan but she would like to wait at this time since she is feeling so much better, I think this is reasonable even though she has a leukocytosis of 20,000.  She understands indications for return and will be given a to go pack of Zofran  as well as a formal prescription to the pharmacy.  She is agreeable as is her spouse.  She has normal vital signs and is stable exam and has Medical Decision Making capacity.  She is well at the time of discharge   Cleotilde Rogue, MD 03/12/24 [phone]

## 2024-03-12 NOTE — ED Triage Notes (Signed)
 Pt bib [facility] for 2 syncopal episodes today. Pt had a sudden episode of N/V/D then passed out in the bathroom floor. Pt does not think she hit her head. Denies taking blood thinners.  When EMS arrived to treat pt, pt had one episode of emesis and a syncopal episode witnessed by EMS. Pt given 400 cc NS in route. No Zofran  given d/t prolonged QT per EMS.

## 2024-03-12 NOTE — Telephone Encounter (Signed)
 ADDYSON.ADU- ED advised.

## 2024-03-17 MED FILL — Ondansetron HCl Tab 4 MG: ORAL | Qty: 4 | Status: AC

## 2024-05-07 ENCOUNTER — Other Ambulatory Visit: Payer: Self-pay | Admitting: Family Medicine

## 2024-05-07 DIAGNOSIS — I10 Essential (primary) hypertension: Secondary | ICD-10-CM

## 2024-06-12 ENCOUNTER — Ambulatory Visit: Admitting: Family Medicine
